# Patient Record
Sex: Female | Born: 1960 | Hispanic: Yes | Smoking: Never smoker
Health system: Southern US, Community
[De-identification: ages and names within clinical notes are randomized; demographics above are authoritative.]

## PROBLEM LIST (undated history)

## (undated) DIAGNOSIS — N289 Disorder of kidney and ureter, unspecified: Secondary | ICD-10-CM

## (undated) DIAGNOSIS — J189 Pneumonia, unspecified organism: Secondary | ICD-10-CM

## (undated) DIAGNOSIS — G629 Polyneuropathy, unspecified: Secondary | ICD-10-CM

## (undated) DIAGNOSIS — F329 Major depressive disorder, single episode, unspecified: Secondary | ICD-10-CM

## (undated) DIAGNOSIS — Z993 Dependence on wheelchair: Secondary | ICD-10-CM

## (undated) DIAGNOSIS — F32A Depression, unspecified: Secondary | ICD-10-CM

## (undated) DIAGNOSIS — A419 Sepsis, unspecified organism: Secondary | ICD-10-CM

## (undated) DIAGNOSIS — R079 Chest pain, unspecified: Secondary | ICD-10-CM

## (undated) DIAGNOSIS — I10 Essential (primary) hypertension: Secondary | ICD-10-CM

## (undated) HISTORY — PX: CHOLECYSTECTOMY: SHX55

## (undated) HISTORY — PX: ABDOMINAL HYSTERECTOMY: SHX81

## (undated) HISTORY — DX: Chest pain, unspecified: R07.9

## (undated) HISTORY — PX: APPENDECTOMY: SHX54

---

## 2002-04-22 ENCOUNTER — Emergency Department (HOSPITAL_COMMUNITY): Admission: EM | Admit: 2002-04-22 | Discharge: 2002-04-22 | Payer: Self-pay | Admitting: Emergency Medicine

## 2004-09-05 ENCOUNTER — Inpatient Hospital Stay: Payer: Self-pay | Admitting: Unknown Physician Specialty

## 2005-12-12 ENCOUNTER — Emergency Department (HOSPITAL_COMMUNITY): Admission: EM | Admit: 2005-12-12 | Discharge: 2005-12-13 | Payer: Self-pay | Admitting: Emergency Medicine

## 2008-01-29 ENCOUNTER — Other Ambulatory Visit: Payer: Self-pay

## 2008-01-29 ENCOUNTER — Observation Stay: Payer: Self-pay | Admitting: Internal Medicine

## 2008-01-30 ENCOUNTER — Other Ambulatory Visit: Payer: Self-pay

## 2008-12-18 ENCOUNTER — Ambulatory Visit (HOSPITAL_COMMUNITY): Admission: RE | Admit: 2008-12-18 | Discharge: 2008-12-18 | Payer: Self-pay

## 2009-02-03 ENCOUNTER — Ambulatory Visit (HOSPITAL_COMMUNITY): Admission: RE | Admit: 2009-02-03 | Discharge: 2009-02-03 | Payer: Self-pay | Admitting: Family Medicine

## 2009-02-24 ENCOUNTER — Emergency Department (HOSPITAL_COMMUNITY): Admission: EM | Admit: 2009-02-24 | Discharge: 2009-02-24 | Payer: Self-pay | Admitting: Emergency Medicine

## 2009-02-25 ENCOUNTER — Encounter (INDEPENDENT_AMBULATORY_CARE_PROVIDER_SITE_OTHER): Payer: Self-pay | Admitting: *Deleted

## 2009-04-15 ENCOUNTER — Ambulatory Visit: Payer: Self-pay | Admitting: Internal Medicine

## 2009-04-15 DIAGNOSIS — R197 Diarrhea, unspecified: Secondary | ICD-10-CM

## 2009-04-15 DIAGNOSIS — K219 Gastro-esophageal reflux disease without esophagitis: Secondary | ICD-10-CM | POA: Insufficient documentation

## 2009-04-16 ENCOUNTER — Encounter: Payer: Self-pay | Admitting: Internal Medicine

## 2009-04-29 ENCOUNTER — Ambulatory Visit (HOSPITAL_COMMUNITY): Admission: RE | Admit: 2009-04-29 | Discharge: 2009-04-29 | Payer: Self-pay | Admitting: Internal Medicine

## 2009-04-29 ENCOUNTER — Ambulatory Visit: Payer: Self-pay | Admitting: Internal Medicine

## 2009-09-15 ENCOUNTER — Encounter: Payer: Self-pay | Admitting: Physician Assistant

## 2009-09-30 ENCOUNTER — Ambulatory Visit: Payer: Self-pay | Admitting: Family Medicine

## 2009-09-30 DIAGNOSIS — G43909 Migraine, unspecified, not intractable, without status migrainosus: Secondary | ICD-10-CM | POA: Insufficient documentation

## 2009-09-30 DIAGNOSIS — F329 Major depressive disorder, single episode, unspecified: Secondary | ICD-10-CM | POA: Insufficient documentation

## 2009-09-30 DIAGNOSIS — I10 Essential (primary) hypertension: Secondary | ICD-10-CM

## 2009-09-30 DIAGNOSIS — E119 Type 2 diabetes mellitus without complications: Secondary | ICD-10-CM | POA: Insufficient documentation

## 2009-09-30 DIAGNOSIS — E669 Obesity, unspecified: Secondary | ICD-10-CM

## 2009-09-30 DIAGNOSIS — E1169 Type 2 diabetes mellitus with other specified complication: Secondary | ICD-10-CM

## 2009-10-01 ENCOUNTER — Telehealth: Payer: Self-pay | Admitting: Physician Assistant

## 2009-10-07 ENCOUNTER — Ambulatory Visit: Payer: Self-pay | Admitting: Family Medicine

## 2009-10-07 DIAGNOSIS — M76899 Other specified enthesopathies of unspecified lower limb, excluding foot: Secondary | ICD-10-CM

## 2009-10-07 DIAGNOSIS — E669 Obesity, unspecified: Secondary | ICD-10-CM | POA: Insufficient documentation

## 2009-10-12 ENCOUNTER — Telehealth: Payer: Self-pay | Admitting: Physician Assistant

## 2009-10-22 ENCOUNTER — Telehealth: Payer: Self-pay | Admitting: Family Medicine

## 2009-11-15 ENCOUNTER — Encounter: Payer: Self-pay | Admitting: Physician Assistant

## 2009-11-15 ENCOUNTER — Ambulatory Visit: Payer: Self-pay | Admitting: Family Medicine

## 2009-11-15 DIAGNOSIS — R002 Palpitations: Secondary | ICD-10-CM

## 2009-11-18 ENCOUNTER — Encounter: Payer: Self-pay | Admitting: Physician Assistant

## 2009-12-03 ENCOUNTER — Encounter (INDEPENDENT_AMBULATORY_CARE_PROVIDER_SITE_OTHER): Payer: Self-pay | Admitting: *Deleted

## 2009-12-03 ENCOUNTER — Encounter: Payer: Self-pay | Admitting: Physician Assistant

## 2009-12-03 ENCOUNTER — Ambulatory Visit: Payer: Self-pay | Admitting: Family Medicine

## 2009-12-03 DIAGNOSIS — R109 Unspecified abdominal pain: Secondary | ICD-10-CM | POA: Insufficient documentation

## 2009-12-03 LAB — CONVERTED CEMR LAB
ALT: 15 units/L
AST: 13 units/L
Basophils Absolute: 0 10*3/uL
Eosinophils Absolute: 0.1 10*3/uL
Glomerular Filtration Rate, Af Am: >60 mL/min/{1.73_m2}
Glucose, Bld: 57 mg/dL
Lymphocytes Relative: 30 %
Lymphs Abs: 2.7 10*3/uL
MCHC: 32.8 g/dL
MCV: 94 fL
Potassium: 4.4 meq/L
RDW: 13.9 %
Sodium: 140 meq/L
Total Protein: 6.7 g/dL

## 2009-12-07 ENCOUNTER — Ambulatory Visit (HOSPITAL_COMMUNITY): Admission: RE | Admit: 2009-12-07 | Discharge: 2009-12-07 | Payer: Self-pay | Admitting: Family Medicine

## 2009-12-09 LAB — CONVERTED CEMR LAB
ALT: 15 units/L (ref 0–35)
Alkaline Phosphatase: 66 units/L (ref 39–117)
Basophils Absolute: 0 10*3/uL (ref 0.0–0.1)
Calcium: 8.7 mg/dL (ref 8.4–10.5)
Creatinine, Ser: 0.49 mg/dL (ref 0.40–1.20)
Eosinophils Absolute: 0.1 10*3/uL (ref 0.0–0.7)
Eosinophils Relative: 1 % (ref 0–5)
HCT: 43.9 % (ref 36.0–46.0)
Lipase: 23 units/L (ref 0–75)
Lymphocytes Relative: 30 % (ref 12–46)
Microalb Creat Ratio: 8.8 mg/g (ref 0.0–30.0)
Platelets: 237 10*3/uL (ref 150–400)
RDW: 13.9 % (ref 11.5–15.5)
Sodium: 140 meq/L (ref 135–145)
Total Bilirubin: 0.6 mg/dL (ref 0.3–1.2)
Total Protein: 6.7 g/dL (ref 6.0–8.3)

## 2009-12-16 ENCOUNTER — Telehealth: Payer: Self-pay | Admitting: Physician Assistant

## 2009-12-17 ENCOUNTER — Ambulatory Visit: Payer: Self-pay | Admitting: Family Medicine

## 2009-12-17 LAB — CONVERTED CEMR LAB
HDL: 43 mg/dL (ref >39)
LDL Cholesterol: 92 mg/dL (ref 0–99)
Total CHOL/HDL Ratio: 3.8
VLDL: 28 mg/dL (ref 0–40)

## 2009-12-20 ENCOUNTER — Encounter: Payer: Self-pay | Admitting: Physician Assistant

## 2009-12-28 ENCOUNTER — Telehealth: Payer: Self-pay | Admitting: Family Medicine

## 2010-04-25 ENCOUNTER — Ambulatory Visit: Payer: Self-pay | Admitting: Family Medicine

## 2010-04-25 ENCOUNTER — Encounter: Payer: Self-pay | Admitting: Physician Assistant

## 2010-04-25 ENCOUNTER — Encounter (INDEPENDENT_AMBULATORY_CARE_PROVIDER_SITE_OTHER): Payer: Self-pay | Admitting: *Deleted

## 2010-04-25 DIAGNOSIS — R0789 Other chest pain: Secondary | ICD-10-CM | POA: Insufficient documentation

## 2010-04-25 DIAGNOSIS — R42 Dizziness and giddiness: Secondary | ICD-10-CM

## 2010-04-25 LAB — CONVERTED CEMR LAB
ALT: 15 units/L
AST: 12 units/L
Alkaline Phosphatase: 103 units/L
BUN: 16 mg/dL
Blood Glucose, Fingerstick: 271
Chloride: 104 meq/L
Creatinine, Ser: 0.59 mg/dL
HCT: 42.7 %
Hgb A1c MFr Bld: 9.1 %
MCHC: 33.5 g/dL
MCV: 93.4 fL
Platelets: 237 10*3/uL
RDW: 13.2 %
Total Protein: 6.4 g/dL
WBC: 8.2 10*3/uL

## 2010-04-27 ENCOUNTER — Encounter (INDEPENDENT_AMBULATORY_CARE_PROVIDER_SITE_OTHER): Payer: Self-pay | Admitting: *Deleted

## 2010-04-28 ENCOUNTER — Encounter: Payer: Self-pay | Admitting: Physician Assistant

## 2010-04-28 ENCOUNTER — Ambulatory Visit (HOSPITAL_COMMUNITY): Admission: RE | Admit: 2010-04-28 | Discharge: 2010-04-28 | Payer: Self-pay | Admitting: Family Medicine

## 2010-04-28 LAB — CONVERTED CEMR LAB
ALT: 15 units/L (ref 0–35)
AST: 12 units/L (ref 0–37)
Albumin: 4.1 g/dL (ref 3.5–5.2)
Calcium: 9.2 mg/dL (ref 8.4–10.5)
Chloride: 104 meq/L (ref 96–112)
Platelets: 237 10*3/uL (ref 150–400)
Potassium: 3.7 meq/L (ref 3.5–5.3)
RDW: 13.2 % (ref 11.5–15.5)
Sodium: 136 meq/L (ref 135–145)

## 2010-04-29 ENCOUNTER — Encounter: Payer: Self-pay | Admitting: Physician Assistant

## 2010-05-04 ENCOUNTER — Ambulatory Visit: Payer: Self-pay | Admitting: Cardiology

## 2010-05-05 ENCOUNTER — Encounter: Payer: Self-pay | Admitting: Cardiology

## 2010-05-09 ENCOUNTER — Encounter: Payer: Self-pay | Admitting: Cardiology

## 2010-06-22 ENCOUNTER — Telehealth: Payer: Self-pay | Admitting: Family Medicine

## 2010-06-27 ENCOUNTER — Telehealth (INDEPENDENT_AMBULATORY_CARE_PROVIDER_SITE_OTHER): Payer: Self-pay | Admitting: *Deleted

## 2010-06-30 ENCOUNTER — Ambulatory Visit: Payer: Self-pay | Admitting: Family Medicine

## 2010-07-08 ENCOUNTER — Encounter: Payer: Self-pay | Admitting: Family Medicine

## 2010-07-15 ENCOUNTER — Emergency Department (HOSPITAL_COMMUNITY)
Admission: EM | Admit: 2010-07-15 | Discharge: 2010-07-16 | Payer: Self-pay | Source: Home / Self Care | Admitting: Emergency Medicine

## 2010-07-29 ENCOUNTER — Ambulatory Visit (HOSPITAL_COMMUNITY): Admit: 2010-07-29 | Payer: Self-pay | Admitting: Psychology

## 2010-08-14 ENCOUNTER — Encounter: Payer: Self-pay | Admitting: Family Medicine

## 2010-08-21 LAB — CONVERTED CEMR LAB: TSH: 0.816 microintl units/mL (ref 0.350–4.500)

## 2010-08-23 NOTE — Assessment & Plan Note (Signed)
Summary: follow up - room 1   Vital Signs:  Patient profile:   50 year old female Height:      63 inches Weight:      261.25 pounds BMI:     46.45 O2 Sat:      97 % on Room air Pulse rate:   107 / minute Resp:     16 per minute BP sitting:   130 / 78  (left arm)  Vitals Entered By: Baldomero Lamy LPN (March 17, 624THL 075-GRM PM) CC: follow-up visit Is Patient Diabetic? Yes Did you bring your meter with you today? No Pain Assessment Patient in pain? no        Referring Provider:  Verneda Skill, [facility] ED Primary Provider:  Kennith Gain PA  CC:  follow-up visit.  History of Present Illness: Pt is here today for f/u of her diabetes.  She did not bring her blood sugar log.  States is was in the mid 200's this am when checked it.  Did increase her Glipizide but not her Metformin.  Remembered that she had diarrhea with higher doses of Metformin in the past.    Pt admits that she needs to eat better.  Like carbs - escpecially pasta & rice.  Feels that she needs help with her diet.  Pt also has pain in her Lt hip & leg.  Painful to lie on Lt side.  Also more aching & discomfort at the end of the day & while working.  She denies any recent or past low back pain.  Was told prev that she has bursitis in her hip.  Tried prescription meds but didn't help much.  Current Medications (verified): 1)  Effexor Xr 150 Mg Xr24h-Cap (Venlafaxine Hcl) .... Once Daily 2)  Glyburide 5 Mg Tabs (Glyburide) .... Once Daily 3)  Metformin Hcl 500 Mg Tabs (Metformin Hcl) .... Once Daily 4)  Novolin N 100 Unit/ml Susp (Insulin Isophane Human) .... 20u Once Daily 5)  Enalapril Maleate 10 Mg Tabs (Enalapril Maleate) .... One By Mouth Daily 6)  Bd Insulin Syringe Ultrafine 31g X 5/16" 1 Ml Misc (Insulin Syringe-Needle U-100) .... Use To Inject Insulin Subcutaneously Once Daily 7)  Sumatriptan Succinate 100 Mg Tabs (Sumatriptan Succinate) .... Take 1 At Onset of Migraine.  May Take 2nd Tab 2 Hrs Later If Needed.  Max 2  Tabs/24 Hrs.  Allergies (verified): 1)  ! Codeine 2)  ! Ibuprofen  Past History:  Past medical history reviewed for relevance to current acute and chronic problems.  Past Medical History: Reviewed history from 09/30/2009 and no changes required.  Hypertension Depression Diabetes mellitus, type II  Physical Exam  General:  Well-developed,well-nourished,in no acute distress; alert,appropriate and cooperative throughout examination Head:  Normocephalic and atraumatic without obvious abnormalities. No apparent alopecia or balding. Ears:  External ear exam shows no significant lesions or deformities.  Otoscopic examination reveals clear canals, tympanic membranes are intact bilaterally without bulging, retraction, inflammation or discharge. Hearing is grossly normal bilaterally. Nose:  External nasal examination shows no deformity or inflammation. Nasal mucosa are pink and moist without lesions or exudates. Mouth:  Oral mucosa and oropharynx without lesions or exudates.  Teeth in good repair. Neck:  No deformities, masses, or tenderness noted. Lungs:  Normal respiratory effort, chest expands symmetrically. Lungs are clear to auscultation, no crackles or wheezes. Heart:  Normal rate and regular rhythm. S1 and S2 normal without gallop, murmur, click, rub or other extra sounds. Msk:  LS spine nontender to  palp spinous processes, SI joints & sciatic notches. Lt hip tender to palp over greater trochanter.  Remainder of LLE is nontender. Pulses:  R posterior tibial normal and R dorsalis pedis normal.   Extremities:  No clubbing, cyanosis, edema, or deformity noted with normal full range of motion of all joints.   Neurologic:  alert & oriented X3, sensation intact to light touch, gait normal, and DTRs symmetrical and normal.   Skin:  spider veins LLE No varicose veins noted Cervical Nodes:  No lymphadenopathy noted Psych:  Cognition and judgment appear intact. Alert and cooperative with normal  attention span and concentration. No apparent delusions, illusions, hallucinations   Impression & Recommendations:  Problem # 1:  DIABETES MELLITUS, TYPE II (ICD-250.00) Assessment Unchanged Discussed with pt addition of insulin vs trying to maximize oral meds.  Pt prefers to try additional oral meds. I believe pt will need a change of her insulin. Consider Lantus &/or premixed in place of her Novolin N. Pt was given diabetic diet/carb book to review & improve diet habits.  Her updated medication list for this problem includes:    Glyburide 5 Mg Tabs (Glyburide) .Marland Kitchen... 2 daily    Metformin Hcl 500 Mg Tabs (Metformin hcl) .Marland Kitchen... 2 pills once daily    Novolin N 100 Unit/ml Susp (Insulin isophane human) .Marland Kitchen... 20u once daily    Enalapril Maleate 10 Mg Tabs (Enalapril maleate) ..... One by mouth daily    Januvia 100 Mg Tabs (Sitagliptin phosphate) .Marland Kitchen... Take 1 daily  Problem # 2:  HYPERTENSION (ICD-401.9) Assessment: Unchanged  Her updated medication list for this problem includes:    Enalapril Maleate 10 Mg Tabs (Enalapril maleate) ..... One by mouth daily    Furosemide 20 Mg Tabs (Furosemide) .Marland Kitchen... Take 1 q am as needed for swelling  BP today: 130/78 Prior BP: 140/80 (09/30/2009)  Problem # 3:  TROCHANTERIC BURSITIS, LEFT (ICD-726.5) Assessment: Unchanged Ice area 3-4 times a day. Will use Tylenol as needed for pain. Recommended ortho consult since this has been ongoing & failed other treatments from prev dr.  Pt wishes to wait due to other medical bills.  Problem # 4:  OBESITY (ICD-278.00) Assessment: Deteriorated  Ht: 63 (10/07/2009)   Wt: 261.25 (10/07/2009)   BMI: 46.45 (10/07/2009)  Discussed diet & exercise.  Complete Medication List: 1)  Effexor Xr 150 Mg Xr24h-cap (Venlafaxine hcl) .... Once daily 2)  Glyburide 5 Mg Tabs (Glyburide) .... 2 daily 3)  Metformin Hcl 500 Mg Tabs (Metformin hcl) .... 2 pills once daily 4)  Novolin N 100 Unit/ml Susp (Insulin isophane human)  .... 20u once daily 5)  Enalapril Maleate 10 Mg Tabs (Enalapril maleate) .... One by mouth daily 6)  Bd Insulin Syringe Ultrafine 31g X 5/16" 1 Ml Misc (Insulin syringe-needle u-100) .... Use to inject insulin subcutaneously once daily 7)  Sumatriptan Succinate 100 Mg Tabs (Sumatriptan succinate) .... Take 1 at onset of migraine.  may take 2nd tab 2 hrs later if needed.  max 2 tabs/24 hrs. 8)  Januvia 100 Mg Tabs (Sitagliptin phosphate) .... Take 1 daily 9)  Furosemide 20 Mg Tabs (Furosemide) .... Take 1 q am as needed for swelling  Other Orders: Pneumococcal Vaccine [phone]) Admin 1st Vaccine [id number]) Admin 1st Vaccine [facility]) [id number])  Patient Instructions: 1)  Please schedule a follow-up appointment in 1 month. 2)  It is important that you exercise regularly at least 20 minutes 5 times a week. If you develop chest pain, have severe difficulty breathing, or  feel very tired , stop exercising immediately and seek medical attention. 3)  You need to lose weight. Consider a lower calorie diet and regular exercise.  4)  Check your blood sugars regularly. Bring your blood sugar log with you to your next appt. 5)  You have received a pneumonia shot today. 6)  See your eye doctor yearly to check for diabetic eye damage. Prescriptions: FUROSEMIDE 20 MG TABS (FUROSEMIDE) take 1 q am as needed for swelling  #30 x 0   Entered and Authorized by:   Kennith Gain PA   Signed by:   Baldomero Lamy LPN on 075-GRM   Method used:   Electronically to        [facility] (retail)       [address]       [place], [place]  [address]       Ph: [phone]       Fax: [phone]   RxID:   [id number] JANUVIA 100 MG TABS (SITAGLIPTIN PHOSPHATE) take 1 daily  #30 x 3   Entered and Authorized by:   Kennith Gain PA   Signed by:   Baldomero Lamy LPN on 075-GRM   Method used:   Electronically to        [facility]  [address] (retail)       [address]        [place],   [address]       Ph: [phone]       Fax: [phone]   RxID:   [id number]    Pneumovax Vaccine    Vaccine Type: Pneumovax    Site: left deltoid    Mfr: Merck    Dose: 0.5 ml    Route: IM    Given by: Baldomero Lamy LPN    Exp. Date: 08/19/2010    Lot #: [id number]    VIS given: 02/19/96 version given October 07, 2009.

## 2010-08-23 NOTE — Miscellaneous (Signed)
Summary: labs cbcd,cmp,a1c, 04/25/2010  Clinical Lists Changes  Observations: Added new observation of CALCIUM: 9.2 mg/dL (04/25/2010 12:35) Added new observation of ALBUMIN: 4.1 g/dL (04/25/2010 12:35) Added new observation of PROTEIN, TOT: 6.4 g/dL (04/25/2010 12:35) Added new observation of SGPT (ALT): 15 units/L (04/25/2010 12:35) Added new observation of SGOT (AST): 12 units/L (04/25/2010 12:35) Added new observation of ALK PHOS: 103 units/L (04/25/2010 12:35) Added new observation of CREATININE: 0.59 mg/dL (04/25/2010 12:35) Added new observation of BUN: 16 mg/dL (04/25/2010 12:35) Added new observation of BG RANDOM: 247 mg/dL (04/25/2010 12:35) Added new observation of CO2 PLSM/SER: 21 meq/L (04/25/2010 12:35) Added new observation of CL SERUM: 104 meq/L (04/25/2010 12:35) Added new observation of K SERUM: 3.7 meq/L (04/25/2010 12:35) Added new observation of NA: 136 meq/L (04/25/2010 12:35) Added new observation of HGBA1C: 9.1 % (04/25/2010 12:35) Added new observation of PLATELETK/UL: 237 K/uL (04/25/2010 12:35) Added new observation of RDW: 13.2 % (04/25/2010 12:35) Added new observation of MCHC RBC: 33.5 g/dL (04/25/2010 12:35) Added new observation of MCV: 93.4 fL (04/25/2010 12:35) Added new observation of HCT: 42.7 % (04/25/2010 12:35) Added new observation of HGB: 14.3 g/dL (04/25/2010 12:35) Added new observation of RBC M/UL: 4.57 M/uL (04/25/2010 12:35) Added new observation of WBC COUNT: 8.2 10*3/microliter (04/25/2010 12:35)

## 2010-08-23 NOTE — Assessment & Plan Note (Signed)
Summary: NEW PATIENT- room 2   Vital Signs:  Patient profile:   50 year old female Height:      63 inches Weight:      258.75 pounds BMI:     46.00 O2 Sat:      97 % on Room air Pulse rate:   122 / minute Resp:     16 per minute BP sitting:   140 / 80  (left arm)  Vitals Entered By: Baldomero Lamy LPN (March 10, 624THL 624THL PM)  Nutrition Counseling: Patient's BMI is greater than 25 and therefore counseled on weight management options.  Serial Vital Signs/Assessments:  Time      Position  BP       Pulse  Resp  Temp     By                     120/78                         Kennith Gain PA  CC: new patient Is Patient Diabetic? Yes Did you bring your meter with you today? No Pain Assessment Patient in pain? no        Referring Provider:  Verneda Skill, [facility] ED Primary Provider:  Kennith Gain PA  CC:  new patient.  History of Present Illness: Pt is here today to establish with a new PCP.  Has been receiving her health care at the health dept.  She is diabetic.  She is concerned about her elevated blood sugars & is hoping to get them better controlled.  She states that in the past she was nearly 400#.  She knows she needs to improve her diet more.  Does not exercise but is active at work.  She is using 35 units of Novolin N once daily.  She is also  taking her glyburide once daily and 1000mg  of metformin once daily.  Pt brough her recent lab results.  Also home FBS readings which range mid 200's to 316.  She has a hx of migraines since childhood.  Had gone away for a long time.  Recently have returned.  Pain back of her head, severe, nausea & vomiting.  Admits to photo & phono sensitivity too.  No parasthesias.  Does have blurred vision off & on but not assoc with headaches.  She is also taking her BP meds daily.  Denies chest pain, palp or difficulty breathing.  Has some mild swelling & stiffness in ams.  Her depression is well controlled on Effexor.  Has been taking x apprx 3 yrs.  Sleeps well.   Current Medications (verified): 1)  Effexor Xr 150 Mg Xr24h-Cap (Venlafaxine Hcl) .... Once Daily 2)  Glyburide 5 Mg Tabs (Glyburide) .... Once Daily 3)  Metformin Hcl 500 Mg Tabs (Metformin Hcl) .... Once Daily 4)  Novolin N 100 Unit/ml Susp (Insulin Isophane Human) .... 20u Once Daily 5)  Enalapril Maleate 10 Mg Tabs (Enalapril Maleate) .... One By Mouth Daily  Allergies (verified): 1)  ! Codeine 2)  ! Ibuprofen  Past History:  Past medical, surgical, family and social histories (including risk factors) reviewed, and no changes noted (except as noted below).  Past Medical History:  Hypertension Depression Diabetes mellitus, type II  Past Surgical History: Reviewed history from 04/15/2009 and no changes required. Partial hysterectomy Cholecystectomy C-section X 2 Left hand, cyst  Family History: Reviewed history from 04/15/2009 and no changes required. No FH of  CRC or IBD. Maternal aunt died of liver dz. Family History of Diabetes Family History of Heart Disease:   Social History: Reviewed history from 04/15/2009 and no changes required. Married.  3 grown children.  Works at [employer], custodial services Patient has never smoked.  Alcohol Use - no Illicit Drug Use - no  Review of Systems Eyes:  Complains of blurring; denies double vision. ENT:  Denies earache, nasal congestion, and sore throat. CV:  Denies chest pain or discomfort and palpitations. Resp:  Denies cough and shortness of breath. GI:  Complains of constipation; denies abdominal pain.  Physical Exam  General:  Well-developed,well-nourished,in no acute distress; alert,appropriate and cooperative throughout examination Head:  Normocephalic and atraumatic without obvious abnormalities. No apparent alopecia or balding. Eyes:  No corneal or conjunctival inflammation noted. EOMI. Perrla. Funduscopic exam benign, without hemorrhages, exudates or papilledema. Vision grossly normal. Ears:   External ear exam shows no significant lesions or deformities.  Otoscopic examination reveals clear canals, tympanic membranes are intact bilaterally without bulging, retraction, inflammation or discharge. Hearing is grossly normal bilaterally. Nose:  External nasal examination shows no deformity or inflammation. Nasal mucosa are pink and moist without lesions or exudates. Mouth:  Oral mucosa and oropharynx without lesions or exudates.  Teeth in good repair. Neck:  No deformities, masses, or tenderness noted. Lungs:  Normal respiratory effort, chest expands symmetrically. Lungs are clear to auscultation, no crackles or wheezes. Heart:  Normal rate and regular rhythm. S1 and S2 normal without gallop, murmur, click, rub or other extra sounds. Abdomen:  Bowel sounds positive,abdomen soft and non-tender without masses, organomegaly or hernias noted. Extremities:  No clubbing, cyanosis, edema, or deformity noted with normal full range of motion of all joints.   Neurologic:  alert & oriented X3, cranial nerves II-XII intact, strength normal in all extremities, gait normal, and DTRs symmetrical and normal.   Cervical Nodes:  No lymphadenopathy noted Psych:  Cognition and judgment appear intact. Alert and cooperative with normal attention span and concentration. No apparent delusions, illusions, hallucinations   Impression & Recommendations:  Problem # 1:  DIABETES MELLITUS, TYPE II (ICD-250.00) Recommend pt sched eye exam.  Her updated medication list for this problem includes:    Glyburide 5 Mg Tabs (Glyburide) ..... Once daily    Metformin Hcl 500 Mg Tabs (Metformin hcl) ..... Once daily    Novolin N 100 Unit/ml Susp (Insulin isophane human) .Marland Kitchen... 20u once daily    Enalapril Maleate 10 Mg Tabs (Enalapril maleate) ..... One by mouth daily  Discussed with Dr Moshe Cipro Increase Glyburide to 2 daily. Increase Metformin to 2 two times a day Will continue Novolin N at the current dose  currently.  Problem # 2:  HYPERTENSION (ICD-401.9) Assessment: Improved  Cont current prescription dose.  Her updated medication list for this problem includes:    Enalapril Maleate 10 Mg Tabs (Enalapril maleate) ..... One by mouth daily  BP today: 140/80  Recheck 120/78 Prior BP: 112/70 (04/15/2009)  Problem # 3:  DEPRESSION (ICD-311) Assessment: Improved Continue current prescription.  Her updated medication list for this problem includes:    Effexor Xr 150 Mg Xr24h-cap (Venlafaxine hcl) ..... Once daily  Problem # 4:  MIGRAINE HEADACHE (ICD-346.90) Assessment: Deteriorated Imitrex prescription to try.  The following medications were removed from the medication list:    Vicodin 5-500 Mg Tabs (Hydrocodone-acetaminophen) .Marland Kitchen... As needed  Complete Medication List: 1)  Effexor Xr 150 Mg Xr24h-cap (Venlafaxine hcl) .... Once daily 2)  Glyburide 5 Mg Tabs (  Glyburide) .... Once daily 3)  Metformin Hcl 500 Mg Tabs (Metformin hcl) .... Once daily 4)  Novolin N 100 Unit/ml Susp (Insulin isophane human) .... 20u once daily 5)  Enalapril Maleate 10 Mg Tabs (Enalapril maleate) .... One by mouth daily 6)  Bd Insulin Syringe Ultrafine 31g X 5/16" 1 Ml Misc (Insulin syringe-needle u-100) .... Use to inject insulin subcutaneously once daily  Patient Instructions: 1)  Follow up appt in 1 week. 2)  Check your blood sugar fasting & 2 hrs after dinner.  Bring your readings to your next appt. 3)  Increase your Glyburide 2 every morning. 4)  Increase your Metformin to 2 two times a day. 5)  Continue the current dose of insulin. 6)  Call the office if you start having low blood sugars. 7)  I have also prescribed a medication for you to try for your migraine headaches. Prescriptions: ENALAPRIL MALEATE 10 MG TABS (ENALAPRIL MALEATE) one by mouth daily  #30 x 2   Entered by:   Baldomero Lamy LPN   Authorized by:   Kennith Gain PA   Signed by:   Baldomero Lamy LPN on [id number]   Method used:    Electronically to        [facility] (retail)       [address]       [place], [place]  [address]       Ph: [phone]       Fax: [phone]   RxID:   [id number] BD INSULIN SYRINGE ULTRAFINE 31G X 5/16" 1 ML MISC (INSULIN SYRINGE-NEEDLE U-100) USE TO INJECT INSULIN Subcutaneously once daily  #1 MONTH x 2   Entered by:   Baldomero Lamy LPN   Authorized by:   Kennith Gain PA   Signed by:   Baldomero Lamy LPN on [id number]   Method used:   Electronically to        [facility]  [address] (retail)       [address]       [place], [place]  [address]       Ph: [phone]       Fax: [phone]   RxID:   [id number]

## 2010-08-23 NOTE — Letter (Signed)
Summary: Letter  Letter   Imported By: Dierdre Harness 04/28/2010 15:45:41  _____________________________________________________________________  External Attachment:    Type:   Image     Comment:   External Document

## 2010-08-23 NOTE — Assessment & Plan Note (Signed)
Summary: Stomach pain ROOM 1   Vital Signs:  Patient profile:   50 year old female Height:      63 inches Weight:      260.75 pounds BMI:     46.36 O2 Sat:      95 % Pulse rate:   84 / minute Resp:     16 per minute BP sitting:   120 / 72  (left arm) Cuff size:   large  Vitals Entered By: Kate Sable LPN (May 13, 624THL 624THL AM) CC: Hurting in right side for awhile. Has gotten worse and has spread to under her right breast   Referring Provider:  Verneda Skill, [facility] ED Primary Provider:  Kennith Gain PA  CC:  Hurting in right side for awhile. Has gotten worse and has spread to under her right breast.  History of Present Illness: Pt is here today with c/o abd pain.  She has had pain in her RLQ for approx 1 year.  She has seen a surgeon in the past regarding this.  In the last week she has developed intermittent pain in her RUQ.  There is no radiation to her back.  The pain is dull, achey, not sharp.  No change with or without eating.  Has been a little nauseated.  No vomiting, & no change of BM's   No fever.  Hx of DM.  Taking medications as prescribed. No episodes of hypoglycemia.  Blood sugars are coming down & doing much better with Lantus.  Is up to 16 units a day.  She has an appt next week for follow up & will have her labs drawn today.  States overall she is feeling better, & has more energy.  Just having this abd pain.    Current Medications (verified): 1)  Effexor Xr 150 Mg Xr24h-Cap (Venlafaxine Hcl) .... Once Daily 2)  Glyburide 5 Mg Tabs (Glyburide) .... 2 Daily 3)  Enalapril Maleate 10 Mg Tabs (Enalapril Maleate) .... One By Mouth Daily 4)  Bd Insulin Syringe Ultrafine 31g X 5/16" 1 Ml Misc (Insulin Syringe-Needle U-100) .... Use To Inject Insulin Subcutaneously Once Daily 5)  Sumatriptan Succinate 100 Mg Tabs (Sumatriptan Succinate) .... Take 1 At Onset of Migraine.  May Take 2nd Tab 2 Hrs Later If Needed.  Max 2 Tabs/24 Hrs. 6)  Furosemide 20 Mg Tabs (Furosemide) ....  Take 1 Q Am As Needed For Swelling 7)  Fasttake Test  Strp (Glucose Blood) .... Two Times A Day Testing 8)  Venlafaxine Hcl 75 Mg Xr24h-Tab (Venlafaxine Hcl) .... Take 1 Daily in Addition To The Venlafaxine Xr 150 Mg 9)  Kombiglyze Xr 11-998 Mg Xr24h-Tab (Saxagliptin-Metformin) .... Take 1 Daily With Evening Meal.  Discontinue Metformin. 10)  Lantus Solostar 100 Unit/ml Soln (Insulin Glargine) .... Inject 30 Units At Bedtime, or As Directed 11)  Relion Pen Needles 31g X 8 Mm Misc (Insulin Pen Needle) .... To Use With Daily Lantus Injection  Allergies (verified): 1)  ! Codeine 2)  ! Ibuprofen  Past History:  Past medical history reviewed for relevance to current acute and chronic problems. Past surgical history reviewed for relevance to current acute and chronic problems.  Past Medical History: Reviewed history from 09/30/2009 and no changes required.  Hypertension Depression Diabetes mellitus, type II  Past Surgical History: Reviewed history from 04/15/2009 and no changes required. Partial hysterectomy Cholecystectomy C-section X 2 Left hand, cyst  Review of Systems General:  Denies chills and fever. CV:  Denies chest pain or  discomfort. Resp:  Denies shortness of breath. GI:  Complains of abdominal pain and nausea; denies bloody stools, change in bowel habits, constipation, dark tarry stools, diarrhea, indigestion, loss of appetite, and vomiting. GU:  Denies dysuria and urinary frequency.  Physical Exam  General:  Well-developed,well-nourished,in no acute distress; alert,appropriate and cooperative throughout examination Head:  Normocephalic and atraumatic without obvious abnormalities. No apparent alopecia or balding. Ears:  External ear exam shows no significant lesions or deformities.  Otoscopic examination reveals clear canals, tympanic membranes are intact bilaterally without bulging, retraction, inflammation or discharge. Hearing is grossly normal bilaterally. Nose:   External nasal examination shows no deformity or inflammation. Nasal mucosa are pink and moist without lesions or exudates. Mouth:  Oral mucosa and oropharynx without lesions or exudates.  Teeth in good repair. Neck:  No deformities, masses, or tenderness noted. Lungs:  Normal respiratory effort, chest expands symmetrically. Lungs are clear to auscultation, no crackles or wheezes. Heart:  Normal rate and regular rhythm. S1 and S2 normal without gallop, murmur, click, rub or other extra sounds. Abdomen:  Nl BS x 4.  soft, no masses, no hepatomegaly, and no splenomegaly.  + TTP RUQ, no guarding, rebound or rigidity. Cervical Nodes:  No lymphadenopathy noted Psych:  Cognition and judgment appear intact. Alert and cooperative with normal attention span and concentration. No apparent delusions, illusions, hallucinations   Impression & Recommendations:  Problem # 1:  ABDOMINAL PAIN (ICD-789.00) Assessment New  Orders: T-CMP with estimated GFR ([id number]) T-CBC w/Diff [id number]) T-Amylase [id number]) T-Lipase [id number]) CT Abdomen/Pelvis with Contrast (CT A/P with Cont)  Problem # 2:  DIABETES MELLITUS, TYPE II (ICD-250.00) Assessment: Improved  Her updated medication list for this problem includes:    Glyburide 5 Mg Tabs (Glyburide) .Marland Kitchen... 2 daily    Enalapril Maleate 10 Mg Tabs (Enalapril maleate) ..... One by mouth daily    Kombiglyze Xr 11-998 Mg Xr24h-tab (Saxagliptin-metformin) .Marland Kitchen... Take 1 daily with evening meal.  discontinue metformin.    Lantus Solostar 100 Unit/ml Soln (Insulin glargine) ..... Inject 30 units at bedtime, or as directed  Orders: T- Hemoglobin A1C [id number]) TLB-Microalbumin/Creat Ratio, Urine (82043-MALB)  Complete Medication List: 1)  Effexor Xr 150 Mg Xr24h-cap (Venlafaxine hcl) .... Once daily 2)  Glyburide 5 Mg Tabs (Glyburide) .... 2 daily 3)  Enalapril Maleate 10 Mg Tabs (Enalapril maleate) .... One by mouth daily 4)  Bd Insulin Syringe  Ultrafine 31g X 5/16" 1 Ml Misc (Insulin syringe-needle u-100) .... Use to inject insulin subcutaneously once daily 5)  Sumatriptan Succinate 100 Mg Tabs (Sumatriptan succinate) .... Take 1 at onset of migraine.  may take 2nd tab 2 hrs later if needed.  max 2 tabs/24 hrs. 6)  Furosemide 20 Mg Tabs (Furosemide) .... Take 1 q am as needed for swelling 7)  Fasttake Test Strp (Glucose blood) .... Two times a day testing 8)  Venlafaxine Hcl 75 Mg Xr24h-tab (Venlafaxine hcl) .... Take 1 daily in addition to the venlafaxine xr 150 mg 9)  Kombiglyze Xr 11-998 Mg Xr24h-tab (Saxagliptin-metformin) .... Take 1 daily with evening meal.  discontinue metformin. 10)  Lantus Solostar 100 Unit/ml Soln (Insulin glargine) .... Inject 30 units at bedtime, or as directed 11)  Relion Pen Needles 31g X 8 Mm Misc (Insulin pen needle) .... To use with daily lantus injection 12)  Tramadol Hcl 50 Mg Tabs (Tramadol hcl) .... Take 1 every 6 hrs as needed for pain.  Patient Instructions: 1)  Keep your next appt.   2)  If your  pain worsens or you develop fever over the weekend you must go to the ER. 3)  I have prescribed Tramadol for pain. 4)  I have ordered your blood work and a CT scan. Prescriptions: TRAMADOL HCL 50 MG TABS (TRAMADOL HCL) take 1 every 6 hrs as needed for pain.  #30 x 0   Entered and Authorized by:   Kennith Gain PA   Signed by:   Kennith Gain PA on 12/03/2009   Method used:   Electronically to        [facility] (retail)       [address]       [place], [place]  [address]       Ph: [phone]       Fax: [phone]   RxID:   [id number]

## 2010-08-23 NOTE — Progress Notes (Signed)
  Phone Note Call from Patient   Caller: Son Summary of Call: blood sugar has dropped low three times this week and just started vommitting today could this be from new meds?   Initial call taken by: Baldomero Lamy LPN,  March 22, 624THL 4:17 PM  Follow-up for Phone Call        Blood sugar today was 70 this afternoon, was 189 fasting this am. Wilburn Mylar was 197, then in afternoon felt low but didn't check blood sugar. Advised pt Januvia would not be making blood sugar drop.  To decrease her Glipizide back to one a day (from 2 a day). Follow-up by: Kennith Gain PA,  October 12, 2009 5:15 PM

## 2010-08-23 NOTE — Assessment & Plan Note (Signed)
Summary: follow up - room 1   Vital Signs:  Patient profile:   50 year old female Height:      63 inches Weight:      254.50 pounds BMI:     45.25 O2 Sat:      98 % on Room air Pulse rate:   90 / minute Resp:     16 per minute BP sitting:   126 / 70  (left arm)  Vitals Entered By: Baldomero Lamy LPN (May 27, 624THL [id number] AM) CC: follow-up visit Is Patient Diabetic? Yes Comments patient has lost 6 lbs in two weeks through exercise :)    Referring Provider:  Verneda Skill, [facility] ED Primary Provider:  Kennith Gain PA   History of Present Illness: Pt is here today for f/u.  Hx of DM.  Taking medications as prescribed. No episodes of hypoglycemia, except when she had her blood work drawn fasting recently.  Lantus 16 units at bedtime. Blood sugars at home 130-150 fasting. Last eye exam - none. Taking ASA daily.  RUQ abd pain has resolved.  Still has her chronic RLQ abd pain.  CT neg.  She has seen GI in the past.  Neg colonoscopy.  She had surgical consult for laparoscopy but didn't have done because had no ins at that time.  Hx of htn. Taking medications as prescribed. No headache, chest pain or palpitations. Pt prev was having episodes of palpitations.  I had referred her to cardiology.  She cancelled appt because the palpitations have resolved.  Pt agress that if she experiences palpitations again she will see cardiology.  She states she had a cardiology evaluation a couple yrs ago & everything was neg.      Current Medications (verified): 1)  Effexor Xr 150 Mg Xr24h-Cap (Venlafaxine Hcl) .... Once Daily 2)  Glyburide 5 Mg Tabs (Glyburide) .... 2 Daily 3)  Enalapril Maleate 10 Mg Tabs (Enalapril Maleate) .... One By Mouth Daily 4)  Bd Insulin Syringe Ultrafine 31g X 5/16" 1 Ml Misc (Insulin Syringe-Needle U-100) .... Use To Inject Insulin Subcutaneously Once Daily 5)  Sumatriptan Succinate 100 Mg Tabs (Sumatriptan Succinate) .... Take 1 At Onset of Migraine.  May Take 2nd Tab 2 Hrs  Later If Needed.  Max 2 Tabs/24 Hrs. 6)  Furosemide 20 Mg Tabs (Furosemide) .... Take 1 Q Am As Needed For Swelling 7)  Fasttake Test  Strp (Glucose Blood) .... Two Times A Day Testing 8)  Venlafaxine Hcl 75 Mg Xr24h-Tab (Venlafaxine Hcl) .... Take 1 Daily in Addition To The Venlafaxine Xr 150 Mg 9)  Kombiglyze Xr 11-998 Mg Xr24h-Tab (Saxagliptin-Metformin) .... Take 1 Daily With Evening Meal.  Discontinue Metformin. 10)  Lantus Solostar 100 Unit/ml Soln (Insulin Glargine) .... Inject 30 Units At Bedtime, or As Directed 11)  Relion Pen Needles 31g X 8 Mm Misc (Insulin Pen Needle) .... To Use With Daily Lantus Injection 12)  Tramadol Hcl 50 Mg Tabs (Tramadol Hcl) .... Take 1 Every 6 Hrs As Needed For Pain.  Allergies (verified): 1)  ! Codeine 2)  ! Ibuprofen  Past History:  Past medical history reviewed for relevance to current acute and chronic problems.  Past Medical History: Reviewed history from 09/30/2009 and no changes required.  Hypertension Depression Diabetes mellitus, type II  Review of Systems General:  Denies chills, fever, and loss of appetite. ENT:  Denies ear discharge and nasal congestion. CV:  Denies chest pain or discomfort and palpitations. Resp:  Denies cough and shortness of  breath. GI:  Complains of abdominal pain; denies change in bowel habits, loss of appetite, nausea, and vomiting.  Physical Exam  General:  Well-developed,well-nourished,in no acute distress; alert,appropriate and cooperative throughout examination Head:  Normocephalic and atraumatic without obvious abnormalities. No apparent alopecia or balding. Ears:  External ear exam shows no significant lesions or deformities.  Otoscopic examination reveals clear canals, tympanic membranes are intact bilaterally without bulging, retraction, inflammation or discharge. Hearing is grossly normal bilaterally. Nose:  External nasal examination shows no deformity or inflammation. Nasal mucosa are pink and  moist without lesions or exudates. Mouth:  Oral mucosa and oropharynx without lesions or exudates.  Teeth in good repair. Neck:  No deformities, masses, or tenderness noted. Lungs:  Normal respiratory effort, chest expands symmetrically. Lungs are clear to auscultation, no crackles or wheezes. Heart:  Normal rate and regular rhythm. S1 and S2 normal without gallop, murmur, click, rub or other extra sounds. Abdomen:  Bowel sounds positive,abdomen soft and non-tender without masses, organomegaly or hernias noted. Cervical Nodes:  No lymphadenopathy noted Psych:  Cognition and judgment appear intact. Alert and cooperative with normal attention span and concentration. No apparent delusions, illusions, hallucinations   Impression & Recommendations:  Problem # 1:  DIABETES MELLITUS, TYPE II (ICD-250.00) Assessment Improved  Her updated medication list for this problem includes:    Glyburide 5 Mg Tabs (Glyburide) .Marland Kitchen... 2 daily    Enalapril Maleate 10 Mg Tabs (Enalapril maleate) ..... One by mouth daily    Kombiglyze Xr 11-998 Mg Xr24h-tab (Saxagliptin-metformin) .Marland Kitchen... Take 1 daily with evening meal.  discontinue metformin.    Lantus Solostar 100 Unit/ml Soln (Insulin glargine) ..... Inject 30 units at bedtime, or as directed  Orders: T-Basic Metabolic Panel ([id number]) T- Hemoglobin A1C [id number])  Problem # 2:  HYPERTENSION (ICD-401.9)  Her updated medication list for this problem includes:    Enalapril Maleate 10 Mg Tabs (Enalapril maleate) ..... One by mouth daily    Furosemide 20 Mg Tabs (Furosemide) .Marland Kitchen... Take 1 q am as needed for swelling  Orders: T-Basic Metabolic Panel (99991111)  Problem # 3:  ABDOMINAL PAIN (ICD-789.00) Assessment: Improved  Problem # 4:  OBESITY (ICD-278.00) Assessment: Improved Pt is still exercising regularly, and watching her diet.  She is very happy with the wt loss & I have encouraged her to keep up the good work. Did discuss with pt the  importance of monitoring her Blood sugars at home, because with the wt loss she may need less insulin in the future as she continues to lose wt.  Complete Medication List: 1)  Effexor Xr 150 Mg Xr24h-cap (Venlafaxine hcl) .... Once daily 2)  Glyburide 5 Mg Tabs (Glyburide) .... 2 daily 3)  Enalapril Maleate 10 Mg Tabs (Enalapril maleate) .... One by mouth daily 4)  Bd Insulin Syringe Ultrafine 31g X 5/16" 1 Ml Misc (Insulin syringe-needle u-100) .... Use to inject insulin subcutaneously once daily 5)  Sumatriptan Succinate 100 Mg Tabs (Sumatriptan succinate) .... Take 1 at onset of migraine.  may take 2nd tab 2 hrs later if needed.  max 2 tabs/24 hrs. 6)  Furosemide 20 Mg Tabs (Furosemide) .... Take 1 q am as needed for swelling 7)  Fasttake Test Strp (Glucose blood) .... Two times a day testing 8)  Venlafaxine Hcl 75 Mg Xr24h-tab (Venlafaxine hcl) .... Take 1 daily in addition to the venlafaxine xr 150 mg 9)  Kombiglyze Xr 11-998 Mg Xr24h-tab (Saxagliptin-metformin) .... Take 1 daily with evening meal.  discontinue metformin. 10)  Lantus Solostar 100 Unit/ml Soln (Insulin glargine) .... Inject 30 units at bedtime, or as directed 11)  Relion Pen Needles 31g X 8 Mm Misc (Insulin pen needle) .... To use with daily lantus injection 12)  Tramadol Hcl 50 Mg Tabs (Tramadol hcl) .... Take 1 every 6 hrs as needed for pain.  Other Orders: T-Lipid Profile [id number]) Radiology Referral (Radiology)  Patient Instructions: 1)  Please schedule a follow-up appointment in 3 months. 2)  congratulations on your weight loss!  Keep up the good work. 3)  Continue monitoring your blood sugar daily. 4)  I have ordered blood work for you to have drawn in 3 mos before your next appt. 5)  We will refer you back to the surgeon you saw previously.

## 2010-08-23 NOTE — Progress Notes (Signed)
Summary: refill  Phone Note Call from Patient   Summary of Call: pt needs refill on effexor xr 150mg . [facility] [phone] Initial call taken by: Lenn Cal,  December 28, 2009 10:00 AM  Follow-up for Phone Call        Rx Called In Follow-up by: Baldomero Lamy LPN,  June  7, 624THL 075-GRM AM    Prescriptions: EFFEXOR XR 150 MG XR24H-CAP (VENLAFAXINE HCL) once daily  #30 x 2   Entered by:   Baldomero Lamy LPN   Authorized by:   Tula Nakayama MD   Signed by:   Baldomero Lamy LPN on 075-GRM   Method used:   Electronically to        [facility] (retail)       [address]       [place],   [address]       Ph: [phone]       Fax: [phone]   RxID:   [id number]

## 2010-08-23 NOTE — Letter (Signed)
Summary: [facility] ORDER   Imported By: [place] Crane 05/09/2010 15:32:44  _____________________________________________________________________  External Attachment:    Type:   Image     Comment:   External Document

## 2010-08-23 NOTE — Letter (Signed)
Summary: Laboratory/X-Ray Results  [facility]  [address]   [place], [place] [address]   Phone: [phone]  Fax: [phone]    Lab/X-Ray Results  Dec 20, 2009  MRN: [id number]  [facility] [address] [place], [place]  [address]    The results of your recent lab/x-ray has been reviewed and were found:  Cholesterol levels are very good.   If you have any questions, please contact our office.     Kennith Gain PA
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
## 2010-08-23 NOTE — Letter (Signed)
Summary: 1ST MISSED LETTER  1ST MISSED LETTER   Imported By: Dierdre Harness 07/01/2010 13:18:45  _____________________________________________________________________  External Attachment:    Type:   Image     Comment:   External Document

## 2010-08-23 NOTE — Miscellaneous (Signed)
Summary: ct of abdom and pelvis 12/07/2009  Clinical Lists Changes  Observations: Added new observation of CT OF ABDOME:  Clinical Data: Right upper quadrant and right lower quadrant   abdominal pain.  Pelvic pain.  Prior cholecystectomy, C-section,   hysterectomy.  Hypertension.  Diabetes.    CT ABDOMEN AND PELVIS WITH CONTRAST    Technique:  Multidetector CT imaging of the abdomen and pelvis was   performed following the standard protocol during bolus   administration of intravenous contrast.    Contrast: 100  ml Omnipaque-300    Comparison: 02/24/2009    Findings: Clear lung bases.  Normal heart size without pericardial   or pleural effusion.  Normal liver, stomach.  Splenule.  Normal   pancreas. Cholecystectomy without biliary ductal dilatation.   Normal right adrenal gland.  The left adrenal myelolipoma is   unchanged in size at 2.7 x 3.3 cm.  Left lower pole renal lesion   measures 1.1 cm and fluid density consistent with a tiny cyst.    Normal right kidney.  Small retroperitoneal lymph nodes. No   retroperitoneal or retrocrural adenopathy.    Normal colon  Normal terminal ileum and appendix.  Small bowel   otherwise normal without ascites. No pelvic adenopathy.    Normal   urinary bladder.  Hysterectomy.  No adnexal mass or significant   free fluid. A dystrophic calcification is identified within the   left ovary.   Similar nonspecific subcutaneous nodule within the right anterior   abdominal wall at approximately 9 mm on image 39.   No acute osseous abnormality.    IMPRESSION:    1. No acute process in the abdomen or pelvis.   2.  Similar size of a left adrenal myelolipoma.   3.  Cholecystectomy and hysterectomy.    Read By:  Areta Haber,  M.D. (12/07/2009 12:43)      CT of Abdomen  Procedure date:  12/07/2009  Findings:       Clinical Data: Right upper quadrant and right lower quadrant   abdominal pain.  Pelvic pain.  Prior cholecystectomy,  C-section,   hysterectomy.  Hypertension.  Diabetes.    CT ABDOMEN AND PELVIS WITH CONTRAST    Technique:  Multidetector CT imaging of the abdomen and pelvis was   performed following the standard protocol during bolus   administration of intravenous contrast.    Contrast: 100  ml Omnipaque-300    Comparison: 02/24/2009    Findings: Clear lung bases.  Normal heart size without pericardial   or pleural effusion.  Normal liver, stomach.  Splenule.  Normal   pancreas. Cholecystectomy without biliary ductal dilatation.   Normal right adrenal gland.  The left adrenal myelolipoma is   unchanged in size at 2.7 x 3.3 cm.  Left lower pole renal lesion   measures 1.1 cm and fluid density consistent with a tiny cyst.    Normal right kidney.  Small retroperitoneal lymph nodes. No   retroperitoneal or retrocrural adenopathy.    Normal colon  Normal terminal ileum and appendix.  Small bowel   otherwise normal without ascites. No pelvic adenopathy.    Normal   urinary bladder.  Hysterectomy.  No adnexal mass or significant   free fluid. A dystrophic calcification is identified within the   left ovary.   Similar nonspecific subcutaneous nodule within the right anterior   abdominal wall at approximately 9 mm on image 39.   No acute osseous abnormality.    IMPRESSION:  1. No acute process in the abdomen or pelvis.   2.  Similar size of a left adrenal myelolipoma.   3.  Cholecystectomy and hysterectomy.    Read By:  Areta Haber,  Jerilynn Mages.D.

## 2010-08-23 NOTE — Progress Notes (Signed)
  Phone Note Call from Patient   Caller: Patient Summary of Call: patient states nothing was sent in for her migraine headaches, discharge summary does state somthing was to be sent in Initial call taken by: Baldomero Lamy LPN,  March 11, 624THL 3:12 PM    New/Updated Medications: SUMATRIPTAN SUCCINATE 100 MG TABS (SUMATRIPTAN SUCCINATE) Take 1 at onset of migraine.  May take 2nd tab 2 hrs later if needed.  Max 2 tabs/24 hrs. Prescriptions: SUMATRIPTAN SUCCINATE 100 MG TABS (SUMATRIPTAN SUCCINATE) Take 1 at onset of migraine.  May take 2nd tab 2 hrs later if needed.  Max 2 tabs/24 hrs.  #9 x 1   Entered and Authorized by:   Kennith Gain PA   Signed by:   Kennith Gain PA on 10/04/2009   Method used:   Electronically to        [facility] (retail)       [address]       [place], [place]  [address]       Ph: [phone]       Fax: [phone]   RxID:   [id number]   Appended Document:  called patient, left message  Appended Document:  called patient, left message

## 2010-08-23 NOTE — Progress Notes (Signed)
  Phone Note Outgoing Call   Summary of Call: I had ordered Lipid panel on last set of labs, but wasnt done.  Pls order & notify pt to have drawn fasting. Initial call taken by: Kennith Gain PA,  Dec 16, 2009 2:28 PM  Follow-up for Phone Call        labs ordered, called patient, left message Follow-up by: Baldomero Lamy LPN,  May 26, 624THL [id number] PM

## 2010-08-23 NOTE — Progress Notes (Signed)
Summary: STRAP THROAT , FEVER  Phone Note Call from Patient   Summary of Call: THINKS SHE HAS STRAP THROAT HAS BEEN AROUND WITH SOMEONE WHO HAS HAD IT AND RUNNING A FEVER STOPPED UP NOSE AND ACHING WANST TO BE SEN  CALL BACK AT [phone] CELL # [phone] Initial call taken by: Dierdre Harness,  June 22, 2010 9:12 AM  Follow-up for Phone Call        advised patient we have no opening and with fever she needs to be seen asap, advised patient to go to urgent care and she agrees Follow-up by: Baldomero Lamy LPN,  November 30, 624THL 9:31 AM

## 2010-08-23 NOTE — Assessment & Plan Note (Signed)
Summary: follow up- room 1   Vital Signs:  Patient profile:   50 year old female Height:      63 inches Weight:      258.75 pounds BMI:     46.00 O2 Sat:      97 % on Room air Pulse rate:   107 / minute Pulse (ortho):   112 / minute Resp:     16 per minute BP sitting:   110 / 60  (left arm) BP standing:   120 / 70  Vitals Entered By: Baldomero Lamy LPN (October  3, 624THL 3:19 PM)  Nutrition Counseling: Patient's BMI is greater than 25 and therefore counseled on weight management options.  Serial Vital Signs/Assessments:  Time      Position  BP       Pulse  Resp  Temp     By           Lying LA  120/70   103                   Baldomero Lamy LPN           Sitting   130/70   110                   Baldomero Lamy LPN           Standing  120/70   112                   Baldomero Lamy LPN  CC: follow-up visit Is Patient Diabetic? Yes CBG Result 271 Comments did not bring meds to ov   Referring Provider:  Verneda Skill, [facility] ED Primary Provider:  Kennith Gain PA  CC:  follow-up visit.  History of Present Illness: Pt presents today for check up.  Pt reports 2 week hx of chest discomfort.  Notices that she has belching when she has the chest discomfort.  Intermittently.  No nausea or vomiting.  No abd pain.  Lt  chest pain, mostly at Lt sternal border no radiation.  + diaphoresis.  Hx of DM.  Taking medications as prescribed and denies side effects. No episodes of hypoglycemia. Checking blood sugar at home.  Fasting blood sugars average 300s again. This am 362. Last eye exam - none.  Plans to schedule. Taking ASA daily. Pneumovax UTD.  Dizziness - 4 episodes in last 2 weeks.  Vertigo.  Usually occurs when standing or walking.  Lasts few minutes then resolves. After the dizziness noticed a tingling numb feeling Rt face.  Last for a few minutes then resolved.    Allergies (verified): 1)  ! Codeine 2)  ! Ibuprofen  Past History:  Past medical history reviewed for relevance to current  acute and chronic problems.  Past Medical History: Reviewed history from 09/30/2009 and no changes required.  Hypertension Depression Diabetes mellitus, type II  Review of Systems General:  Complains of fatigue; denies chills and fever. Eyes:  Complains of blurring; denies double vision. ENT:  Denies earache, nasal congestion, postnasal drainage, ringing in ears, sinus pressure, and sore throat. CV:  Complains of chest pain or discomfort and palpitations. Resp:  Denies cough and shortness of breath. GI:  Complains of indigestion; denies abdominal pain, nausea, and vomiting. Neuro:  Complains of headaches and tingling; denies numbness; HX OF MIGRAINES. Marland Kitchen  Physical Exam  General:  Well-developed,well-nourished,in no acute distress; alert,appropriate and cooperative throughout examination Head:  Normocephalic and atraumatic without obvious abnormalities. No apparent  alopecia or balding. Eyes:  No corneal or conjunctival inflammation noted. EOMI. Perrla. Funduscopic exam benign, without hemorrhages, exudates or papilledema. Ears:  External ear exam shows no significant lesions or deformities.  Otoscopic examination reveals clear canals, tympanic membranes are intact bilaterally without bulging, retraction, inflammation or discharge. Hearing is grossly normal bilaterally. Nose:  External nasal examination shows no deformity or inflammation. Nasal mucosa are pink and moist without lesions or exudates. Mouth:  Oral mucosa and oropharynx without lesions or exudates.   Neck:  No deformities, masses, or tenderness noted.no carotid bruits.   Lungs:  Normal respiratory effort, chest expands symmetrically. Lungs are clear to auscultation, no crackles or wheezes. Heart:  Normal rate and regular rhythm. S1 and S2 normal without gallop, murmur, click, rub or other extra sounds. Cervical Nodes:  No lymphadenopathy noted Psych:  Cognition and judgment appear intact. Alert and cooperative with normal  attention span and concentration. No apparent delusions, illusions, hallucinations   Impression & Recommendations:  Problem # 1:  CHEST PAIN, ATYPICAL (ICD-786.59) Assessment New  Orders: Cardiology Referral (Cardiology)  Problem # 2:  DIZZINESS (ICD-780.4) Assessment: New  Orders: MRI with contrast (MRI w/ contrast) Cardiology Referral (Cardiology) T-CBC No Diff [id number])  Problem # 3:  DIABETES MELLITUS, TYPE II (ICD-250.00) Assessment: Deteriorated  Her updated medication list for this problem includes:    Glyburide 5 Mg Tabs (Glyburide) .Marland Kitchen... 2 daily    Enalapril Maleate 10 Mg Tabs (Enalapril maleate) ..... One by mouth daily    Kombiglyze Xr 11-998 Mg Xr24h-tab (Saxagliptin-metformin) .Marland Kitchen... Take 1 daily with evening meal.  discontinue metformin.    Lantus Solostar 100 Unit/ml Soln (Insulin glargine) ..... Inject 30 units at bedtime, or as directed  Orders: Glucose, (CBG) [id number])  Labs Reviewed: Creat: 0.49 (12/03/2009)    Reviewed HgBA1c results: 6.8 (12/03/2009)  Problem # 4:  HYPERTENSION (ICD-401.9) Assessment: Comment Only  Her updated medication list for this problem includes:    Enalapril Maleate 10 Mg Tabs (Enalapril maleate) ..... One by mouth daily    Furosemide 20 Mg Tabs (Furosemide) .Marland Kitchen... Take 1 q am as needed for swelling  Orders: Cardiology Referral (Cardiology)  BP today: 110/60 Prior BP: 126/70 (12/17/2009)  Labs Reviewed: K+: 4.4 (12/03/2009) Creat: : 0.49 (12/03/2009)   Chol: 163 (12/17/2009)   HDL: 43 (12/17/2009)   LDL: 92 (12/17/2009)   TG: 141 (12/17/2009)  Complete Medication List: 1)  Glyburide 5 Mg Tabs (Glyburide) .... 2 daily 2)  Enalapril Maleate 10 Mg Tabs (Enalapril maleate) .... One by mouth daily 3)  Bd Insulin Syringe Ultrafine 31g X 5/16" 1 Ml Misc (Insulin syringe-needle u-100) .... Use to inject insulin subcutaneously once daily 4)  Sumatriptan Succinate 100 Mg Tabs (Sumatriptan succinate) .... Take 1 at onset of  migraine.  may take 2nd tab 2 hrs later if needed.  max 2 tabs/24 hrs. 5)  Furosemide 20 Mg Tabs (Furosemide) .... Take 1 q am as needed for swelling 6)  Fasttake Test Strp (Glucose blood) .... Two times a day testing 7)  Venlafaxine Hcl 75 Mg Xr24h-tab (Venlafaxine hcl) .... Take 3 daily for depression 8)  Kombiglyze Xr 11-998 Mg Xr24h-tab (Saxagliptin-metformin) .... Take 1 daily with evening meal.  discontinue metformin. 9)  Lantus Solostar 100 Unit/ml Soln (Insulin glargine) .... Inject 30 units at bedtime, or as directed 10)  Relion Pen Needles 31g X 8 Mm Misc (Insulin pen needle) .... To use with daily lantus injection 11)  Tramadol Hcl 50 Mg Tabs (Tramadol hcl) .... Take  1 every 6 hrs as needed for pain. 12)  Fluconazole 150 Mg Tabs (Fluconazole) .... Take 1daily by mouth as needed for yeast infection 13)  Omeprazole 20 Mg Cpdr (Omeprazole) .... Take 1 daily for indigestion  Other Orders: T-Comprehensive Metabolic Panel ([id number]) T- Hemoglobin A1C [id number])  Patient Instructions: 1)  Please schedule a follow-up appointment in 2 weeks. 2)  Have blood work drawn fasting. 3)  Increase your Lantus by 3 units every 3 days until fasting blood sugar is < 150. 4)  Bring your blood sugar readings with you to your appt. 5)  I have referred you to a cardiologist. 6)  I have ordered an MRI of your brain. 7)  I have started you on Omeprazole. Prescriptions: OMEPRAZOLE 20 MG CPDR (OMEPRAZOLE) take 1 daily for indigestion  #30 x 3   Entered and Authorized by:   Kennith Gain PA   Signed by:   Kennith Gain PA on 04/25/2010   Method used:   Electronically to        [facility] (retail)       [place] [address]       [place], [place]  [address]       Ph: [phone]       Fax: [phone]   RxID:   [id number] FLUCONAZOLE 150 MG TABS (FLUCONAZOLE) take 1daily by mouth as needed for yeast infection  #4 x 0   Entered and Authorized by:   Kennith Gain PA    Signed by:   Kennith Gain PA on 04/25/2010   Method used:   Electronically to        [facility] (retail)       [address]       [place], [place]  [address]       Ph: [phone]       Fax: [phone]   RxID:   [id number] VENLAFAXINE HCL 75 MG XR24H-TAB (VENLAFAXINE HCL) take 3 daily for depression  #90 x 3   Entered and Authorized by:   Kennith Gain PA   Signed by:   Kennith Gain PA on 04/25/2010   Method used:   Electronically to        [facility] (retail)       [address]       [place], [place]  [address]       Ph: [phone]       Fax: [phone]   RxID:   [id number] FUROSEMIDE 20 MG TABS (FUROSEMIDE) take 1 q am as needed for swelling  #30 x 0   Entered and Authorized by:   Kennith Gain PA   Signed by:   Kennith Gain PA on 04/25/2010   Method used:   Electronically to        [facility] (retail)       [address]       [place], [place]  [address]       Ph: [phone]       Fax: [phone]   RxID:   [id number] ENALAPRIL MALEATE 10 MG TABS (ENALAPRIL MALEATE) one by mouth daily  #30 Each x 3   Entered and Authorized by:   Kennith Gain PA   Signed by:   Kennith Gain  PA on 04/25/2010   Method used:   Electronically to        [facility] (retail)       [address]       [place], [place]  [address]       Ph: [phone]       Fax: [phone]   RxID:   [id number] GLYBURIDE 5 MG TABS (GLYBURIDE) 2 daily  #60 Each x 3   Entered and Authorized by:   Kennith Gain PA   Signed by:   Kennith Gain PA on 04/25/2010   Method used:   Electronically to        [facility] (retail)       [address]       [place], [place]  [address]       Ph: [phone]       Fax: [phone]   RxID:   [id number]   Laboratory Results   Blood Tests   Date/Time Received: April 25, 2010 3:52 PM  Date/Time Reported:  April 25, 2010 3:52 PM   CBG Random:: [id number]

## 2010-08-23 NOTE — Miscellaneous (Signed)
Summary: labs cmp,cbcd,12/03/2009  Clinical Lists Changes  Observations: Added new observation of CALCIUM: 8.7 mg/dL (12/03/2009 12:30) Added new observation of ALBUMIN: 4.2 g/dL (12/03/2009 12:30) Added new observation of PROTEIN, TOT: 6.7 g/dL (12/03/2009 12:30) Added new observation of SGPT (ALT): 15 units/L (12/03/2009 12:30) Added new observation of SGOT (AST): 13 units/L (12/03/2009 12:30) Added new observation of ALK PHOS: 66 units/L (12/03/2009 12:30) Added new observation of GFR AA: >60 mL/min/1.9m2 (12/03/2009 12:30) Added new observation of GFR: >60 mL/min (12/03/2009 12:30) Added new observation of CREATININE: 0.49 mg/dL (12/03/2009 12:30) Added new observation of BUN: 15 mg/dL (12/03/2009 12:30) Added new observation of BG RANDOM: 57 mg/dL (12/03/2009 12:30) Added new observation of CO2 PLSM/SER: 24 meq/L (12/03/2009 12:30) Added new observation of CL SERUM: 105 meq/L (12/03/2009 12:30) Added new observation of K SERUM: 4.4 meq/L (12/03/2009 12:30) Added new observation of NA: 140 meq/L (12/03/2009 12:30) Added new observation of ABSOLUTE BAS: 0.0 K/uL (12/03/2009 12:30) Added new observation of BASOPHIL %: 1 % (12/03/2009 12:30) Added new observation of EOS ABSLT: 0.1 K/uL (12/03/2009 12:30) Added new observation of % EOS AUTO: 1 % (12/03/2009 12:30) Added new observation of ABSOLUTE MON: 0.6 K/uL (12/03/2009 12:30) Added new observation of MONOCYTE %: 6 % (12/03/2009 12:30) Added new observation of ABS LYMPHOCY: 2.7 K/uL (12/03/2009 12:30) Added new observation of LYMPHS %: 30 % (12/03/2009 12:30) Added new observation of PLATELETK/UL: 237 K/uL (12/03/2009 12:30) Added new observation of RDW: 13.9 % (12/03/2009 12:30) Added new observation of MCHC RBC: 32.8 g/dL (12/03/2009 12:30) Added new observation of MCV: 94.0 fL (12/03/2009 12:30) Added new observation of HCT: 43.9 % (12/03/2009 12:30) Added new observation of HGB: 14.4 g/dL (12/03/2009 12:30) Added new  observation of RBC M/UL: 4.67 M/uL (12/03/2009 12:30) Added new observation of WBC COUNT: 8.9 10*3/microliter (12/03/2009 12:30)

## 2010-08-23 NOTE — Assessment & Plan Note (Signed)
Summary: np6 chest [pain   Visit Type:  Initial Consult Referring Provider:  . Primary Provider:  Kennith Gain PA   History of Present Illness: [name] is seen for an initial visit at the kind request of Ms. Kennith Gain, P.A.-C. for evaluation of chest discomfort and palpitations.  This nice woman has a miriad of complaints including sinus pressure, right ear discomfort, bilateral leg pain, leg diameter discrepancy, palpitations, chest discomfort and exertional dyspnea.  Since beginning a PPI, chest discomfort has resolved.  MRI Brain  Procedure date:  04/28/2010  Findings:         Clinical Data: Dizziness.  Headaches.  Facial numbness.  Blurred   vision.    MRI HEAD WITHOUT AND WITH CONTRAST    Technique:  Multiplanar, multiecho pulse sequences of the brain and   surrounding structures were obtained according to standard protocol   without and with intravenous contrast    Contrast: 20 ml Multihance.    Comparison: None.    Findings: No acute infarct, hemorrhage, mass lesion, hydrocephalus,   or significant extra-axial fluid collection is present.  The   postcontrast images reveal no areas of pathologic enhancement.   Flow is present in the major intracranial arteries.  The globes and   orbits are intact.  The paranasal sinuses and mastoid air cells are   clear.    IMPRESSION:   Negative MRI of the brain.    Read By:  Arna Snipe,  MD   EKG  Procedure date:  05/30/2010  Findings:      Normal sinus rhythm Delayed R-wave progression Minor J-point elevation in the lateral leads  Comparison to prior study performed 04/25/2010: No significant interval change   Current Medications (verified): 1)  Glyburide 5 Mg Tabs (Glyburide) .... 2 Daily 2)  Enalapril Maleate 10 Mg Tabs (Enalapril Maleate) .... One By Mouth Daily 3)  Bd Insulin Syringe Ultrafine 31g X 5/16" 1 Ml Misc (Insulin Syringe-Needle U-100) .... Use To Inject Insulin  Subcutaneously Once Daily 4)  Sumatriptan Succinate 100 Mg Tabs (Sumatriptan Succinate) .... Take 1 At Onset of Migraine.  May Take 2nd Tab 2 Hrs Later If Needed.  Max 2 Tabs/24 Hrs. 5)  Furosemide 20 Mg Tabs (Furosemide) .... Take 1 Q Am As Needed For Swelling 6)  Fasttake Test  Strp (Glucose Blood) .... Two Times A Day Testing 7)  Venlafaxine Hcl 75 Mg Xr24h-Tab (Venlafaxine Hcl) .... Take 3 Daily For Depression 8)  Kombiglyze Xr 11-998 Mg Xr24h-Tab (Saxagliptin-Metformin) .... Take 1 Daily With Evening Meal.  Discontinue Metformin. 9)  Lantus Solostar 100 Unit/ml Soln (Insulin Glargine) .... Inject 30 Units At Bedtime, or As Directed 10)  Relion Pen Needles 31g X 8 Mm Misc (Insulin Pen Needle) .... To Use With Daily Lantus Injection 11)  Tramadol Hcl 50 Mg Tabs (Tramadol Hcl) .... Take 1 Every 6 Hrs As Needed For Pain. 12)  Fluconazole 150 Mg Tabs (Fluconazole) .... Take 1daily By Mouth As Needed For Yeast Infection 13)  Omeprazole 20 Mg Cpdr (Omeprazole) .... Take 1 Daily For Indigestion 14)  Cortisporin 3.5-10000-1 Soln (Neomycin-Polymyxin-Hc) .... 4 Gtts in Right Ear Three Times A Day X 7 Days  Allergies (verified): 1)  ! Codeine 2)  ! Ibuprofen  Comments:  Nurse/Medical Assistant: patient reviewed med list from [facility] and stated all meds  were correct no bottles no list new to our practice.  Past History:  Family History: Last updated: 05-30-10 Father-died at age 55 as the  result of alcohol abuse Mother-died at age 62 with acute myocardial infarction and complications of diabetes Siblings: 2 brothers, one with diabetes and both with cardiac problems; 3 sisters, one with a heart condition.  No FH of CRC or IBD. Maternal aunt died of liver dz. Family History of Diabetes Family History of Heart Disease:   Social History: Last updated: 05/04/2010 Married.  3 grown children.  Employment: Engineer, technical sales Hospital-custodial services Tobacco-never Alcohol Use -  no Illicit Drug Use - no  Past Medical History: Hypertension Chest pain Palpitations Depression Diabetes mellitus, type II-requires insulin Obesity Migraine headache  Past Surgical History: Partial hysterectomy Cholecystectomy C-section X 2 Left hand, cyst Colonoscopy-2011  Family History: Father-died at age 14 as the result of alcohol abuse Mother-died at age 21 with acute myocardial infarction and complications of diabetes Siblings: 2 brothers, one with diabetes and both with cardiac problems; 3 sisters, one with a heart condition.  No FH of CRC or IBD. Maternal aunt died of liver dz. Family History of Diabetes Family History of Heart Disease:   Social History: Married.  3 grown children.  Employment: [employer]-custodial services Tobacco-never Alcohol Use - no Illicit Drug Use - no  Review of Systems       H/O colitis; intermittent edema of the feet and ankles; all other systems reviewed and are negative.  Vital Signs:  Patient profile:   50 year old female Weight:      258 pounds BMI:     45.87 Pulse rate:   108 / minute BP sitting:   116 / 66  (right arm)  Vitals Entered By: Doretha Sou, CNA (May 04, 2010 1:11 PM)  Physical Exam  General:  Obese; well-developed; no acute distress; mildly anxious HEENT-[place]/AT; PERRL; EOM intact; conjunctiva and lids nl; erythema and increased vascularity of the mid- tympanic membrane Neck-No JVD; no carotid bruits: Endocrine-No thyromegaly: Lungs-No tachypnea, clear without rales, rhonchi or wheezes: CV-normal PMI; normal S1 and S2; modest basilar systolic ejection murmur Abdomen-BS normal; soft and non-tender without masses or organomegaly: MS-No deformities, cyanosis or clubbing: Neurologic-Nl cranial nerves; symmetric strength and tone: Skin- Warm, no sig. lesions: Extremities-Nl distal pulses; trace edema; left leg measures 38 cm 10 cm below the tibial plateau, 41 cm on the right    Impression &  Recommendations:  Problem # 1:  CHEST PAIN, ATYPICAL (ICD-786.59) Cardiovascular risk factors are moderate.  Menopausal status is unknown, but she has not had  suggestive symptoms.  A recent lipid profile was better than average.  She uses no tobacco products but she does have diabetes with suboptimal control and hypertension.  Symptoms have now resolved after treatment for a presumed GI etiology.  We will defer further testing for coronary disease for the time being.  She would benefit from statin therapy in light of her diabetes.  Patient also has palpitations.  These are causing her a degree of distress, but this is fairly minor.  We will proceed with an event recorder to identify her arrhythmias, if any.  She describes episodes of dizziness, but these sound more like vertigo.  She describes ear pain and possible sinus congestion.  Evaluation by an ENT physician may be helpful at some point.  She had some erythema of her right tympanic membrane and will be treated with an otic preparation that includes antibiotic and corticosteroids.  I will plan to reassess this nice woman in one month.  Patient Instructions: 1)  Your physician recommends that you schedule a follow-up  appointment in: 1 month 2)  Your physician has recommended you make the following change in your medication: cortisporin eardrops 4 drops in right ear three times a day x 7 days 3)  Your physician has recommended that you wear an event monitor.  Event monitors are medical devices that record the heart's electrical activity. Doctors most often use these monitors to diagnose arrhythmias. Arrhythmias are problems with the speed or rhythm of the heartbeat. The monitor is a small, portable device. You can wear one while you do your normal daily activities. This is usually used to diagnose what is causing palpitations/syncope (passing out). Prescriptions: CORTISPORIN 3.5-10000-1 SOLN (NEOMYCIN-POLYMYXIN-HC) 4 gtts in right ear three times  a day x 7 days  #1 x 0   Entered by:   Tye Savoy RN   Authorized by:   Yehuda Savannah, MD, [facility]   Signed by:   Tye Savoy RN on 05/04/2010   Method used:   Electronically to        [facility] (retail)       [address]       [place],   [address]       Ph: [phone]       Fax: [phone]   RxID:   [id number]

## 2010-08-23 NOTE — Progress Notes (Signed)
Summary: blood sugars  Phone Note Call from Patient   Summary of Call: daughter called miranda and deb bsugar thsi weekend  [phone] to [phone] and this moring it was 450 call back at   [phone] Initial call taken by: Dierdre Harness,  June 27, 2010 10:50 AM  Follow-up for Phone Call        needs to go to urgent  no appts available today, when she gets an appts to come in later this week, she neds to come, she has missed several appts in the pat pls explain and let her know Follow-up by: Tula Nakayama MD,  June 27, 2010 12:26 PM  Additional Follow-up for Phone Call Additional follow up Details #1::        LEFT MESSAGE Additional Follow-up by: Dierdre Harness,  June 27, 2010 1:06 PM    Additional Follow-up for Phone Call Additional follow up Details #2::    SPOKE WITH DAUGHTER WILL BE COMING IN [place] Follow-up by: Dierdre Harness,  June 27, 2010 3:39 PM

## 2010-08-23 NOTE — Letter (Signed)
Summary: Letter  Letter   Imported By: Dierdre Harness 04/29/2010 15:25:54  _____________________________________________________________________  External Attachment:    Type:   Image     Comment:   External Document

## 2010-08-23 NOTE — Progress Notes (Signed)
Summary: refills   Phone Note Call from Patient   Summary of Call: pt needs here insulin injection meds called into [facility] [phone] please call her first.  Initial call taken by: Lenn Cal,  October 22, 2009 9:22 AM  Follow-up for Phone Call        called patient, left message Follow-up by: Baldomero Lamy LPN,  April  4, 624THL 11:02 AM  Additional Follow-up for Phone Call Additional follow up Details #1::        returned call, left message Additional Follow-up by: Baldomero Lamy LPN,  April  6, 624THL 10:28 AM

## 2010-08-23 NOTE — Letter (Signed)
Summary: Letter  Letter   Imported By: Dierdre Harness 11/18/2009 14:24:50  _____________________________________________________________________  External Attachment:    Type:   Image     Comment:   External Document

## 2010-08-25 NOTE — Letter (Signed)
Summary: medical release  medical release   Imported By: Dierdre Harness 07/08/2010 14:14:39  _____________________________________________________________________  External Attachment:    Type:   Image     Comment:   External Document

## 2010-08-31 ENCOUNTER — Other Ambulatory Visit (HOSPITAL_COMMUNITY): Payer: Self-pay | Admitting: Family Medicine

## 2010-08-31 ENCOUNTER — Ambulatory Visit (HOSPITAL_COMMUNITY)
Admission: RE | Admit: 2010-08-31 | Discharge: 2010-08-31 | Disposition: A | Discharge status: Home / Self Care | Payer: Commercial Managed Care - PPO | Source: Ambulatory Visit | Attending: Family Medicine | Admitting: Family Medicine

## 2010-08-31 DIAGNOSIS — M79671 Pain in right foot: Secondary | ICD-10-CM

## 2010-08-31 DIAGNOSIS — M25579 Pain in unspecified ankle and joints of unspecified foot: Secondary | ICD-10-CM | POA: Insufficient documentation

## 2010-09-05 ENCOUNTER — Ambulatory Visit (HOSPITAL_COMMUNITY): Payer: Self-pay | Admitting: Psychiatry

## 2010-10-03 LAB — GLUCOSE, CAPILLARY: Glucose-Capillary: 208 mg/dL — ABNORMAL HIGH (ref 70–99)

## 2010-10-03 LAB — RAPID URINE DRUG SCREEN, HOSP PERFORMED
Amphetamines: NOT DETECTED
Benzodiazepines: NOT DETECTED
Tetrahydrocannabinol: NOT DETECTED

## 2010-10-03 LAB — CBC
MCV: 91.6 fL (ref 78.0–100.0)
Platelets: 214 10*3/uL (ref 150–400)
RBC: 4.43 MIL/uL (ref 3.87–5.11)
RDW: 13.5 % (ref 11.5–15.5)
WBC: 7.8 10*3/uL (ref 4.0–10.5)

## 2010-10-03 LAB — BASIC METABOLIC PANEL
BUN: 13 mg/dL (ref 6–23)
Calcium: 9.1 mg/dL (ref 8.4–10.5)
Chloride: 107 mEq/L (ref 96–112)
Creatinine, Ser: 0.54 mg/dL (ref 0.4–1.2)
GFR calc Af Amer: >60 mL/min (ref >60)

## 2010-10-03 LAB — ETHANOL: Alcohol, Ethyl (B): <5 mg/dL (ref 0–10)

## 2010-10-03 LAB — DIFFERENTIAL
Basophils Absolute: 0.1 10*3/uL (ref 0.0–0.1)
Eosinophils Absolute: 0.1 10*3/uL (ref 0.0–0.7)
Eosinophils Relative: 2 % (ref 0–5)
Lymphocytes Relative: 29 % (ref 12–46)
Lymphs Abs: 2.2 10*3/uL (ref 0.7–4.0)
Neutrophils Relative %: 62 % (ref 43–77)

## 2010-10-03 LAB — ACETAMINOPHEN LEVEL: Acetaminophen (Tylenol), Serum: <10 ug/mL — ABNORMAL LOW (ref 10–30)

## 2010-10-03 LAB — SALICYLATE LEVEL: Salicylate Lvl: <4 mg/dL (ref 2.8–20.0)

## 2010-10-29 LAB — DIFFERENTIAL
Eosinophils Relative: 1 % (ref 0–5)
Lymphocytes Relative: 17 % (ref 12–46)
Lymphs Abs: 1.2 10*3/uL (ref 0.7–4.0)
Monocytes Absolute: 0.4 10*3/uL (ref 0.1–1.0)

## 2010-10-29 LAB — CBC
MCHC: 35.3 g/dL (ref 30.0–36.0)
MCV: 92.6 fL (ref 78.0–100.0)
Platelets: 221 10*3/uL (ref 150–400)
WBC: 7.2 10*3/uL (ref 4.0–10.5)

## 2010-10-29 LAB — COMPREHENSIVE METABOLIC PANEL
ALT: 15 U/L (ref 0–35)
AST: 15 U/L (ref 0–37)
Albumin: 3.6 g/dL (ref 3.5–5.2)
Calcium: 8.9 mg/dL (ref 8.4–10.5)
Chloride: 107 mEq/L (ref 96–112)
Creatinine, Ser: 0.46 mg/dL (ref 0.4–1.2)
GFR calc Af Amer: >60 mL/min (ref >60)
Sodium: 140 mEq/L (ref 135–145)

## 2011-01-29 ENCOUNTER — Emergency Department (HOSPITAL_COMMUNITY)
Admission: EM | Admit: 2011-01-29 | Discharge: 2011-01-29 | Disposition: A | Discharge status: Home / Self Care | Payer: 59 | Attending: Emergency Medicine | Admitting: Emergency Medicine

## 2011-01-29 ENCOUNTER — Encounter: Payer: Self-pay | Admitting: *Deleted

## 2011-01-29 DIAGNOSIS — F329 Major depressive disorder, single episode, unspecified: Secondary | ICD-10-CM | POA: Insufficient documentation

## 2011-01-29 DIAGNOSIS — R739 Hyperglycemia, unspecified: Secondary | ICD-10-CM

## 2011-01-29 DIAGNOSIS — I10 Essential (primary) hypertension: Secondary | ICD-10-CM | POA: Insufficient documentation

## 2011-01-29 DIAGNOSIS — F32A Depression, unspecified: Secondary | ICD-10-CM | POA: Insufficient documentation

## 2011-01-29 DIAGNOSIS — E119 Type 2 diabetes mellitus without complications: Secondary | ICD-10-CM | POA: Insufficient documentation

## 2011-01-29 DIAGNOSIS — N390 Urinary tract infection, site not specified: Secondary | ICD-10-CM | POA: Insufficient documentation

## 2011-01-29 HISTORY — DX: Essential (primary) hypertension: I10

## 2011-01-29 HISTORY — DX: Depression, unspecified: F32.A

## 2011-01-29 HISTORY — DX: Major depressive disorder, single episode, unspecified: F32.9

## 2011-01-29 LAB — URINE MICROSCOPIC-ADD ON

## 2011-01-29 LAB — URINALYSIS, ROUTINE W REFLEX MICROSCOPIC
Glucose, UA: NEGATIVE mg/dL
Specific Gravity, Urine: >1.03 — ABNORMAL HIGH (ref 1.005–1.030)
pH: 5 (ref 5.0–8.0)

## 2011-01-29 LAB — GLUCOSE, CAPILLARY

## 2011-01-29 MED ORDER — SULFAMETHOXAZOLE-TRIMETHOPRIM 800-160 MG PO TABS: 1.0000 | ORAL_TABLET | Freq: Two times a day (BID) | ORAL | Status: AC

## 2011-01-29 NOTE — ED Notes (Signed)
Has been dizzy x 2 days.  States woke up this morning with blood in urine, lower abd pain.  Denies n/v/d.

## 2011-01-29 NOTE — ED Notes (Signed)
cbg completed on pt. Pt resting now. NAD noted at this time.

## 2011-01-29 NOTE — ED Provider Notes (Signed)
History     Chief Complaint  Patient presents with  . Hematuria  . Abdominal Pain    started this morning  . Headache   Patient is a 50 y.o. female presenting with hematuria. The history is provided by the patient.  Hematuria This is a new problem. The current episode started today. The problem is unchanged. She describes the hematuria as gross hematuria. Irritative symptoms include frequency. Associated symptoms include abdominal pain and dysuria. Pertinent negatives include no chills, fever, flank pain, nausea or vomiting. (Diabetes, hysterectomy. )  Patient also notes associated suprapubic pain aggravated with urination and palpation. Denies vaginal bleeding, vaginal d/c. Patient reports h/o diabetes and states she blood glucose levels have been uncontrolled recently and have been measured in the 500s recently.   Past Medical History  Diagnosis Date  . Hypertension   . Diabetes mellitus   . Depression     Past Surgical History  Procedure Date  . Cholecystectomy   . Cesarean section   . Abdominal hysterectomy     Family History  Problem Relation Age of Onset  . Diabetes Mother   . Heart failure Mother   . Hypertension Mother   . Cancer Mother   . Diabetes Father     History  Substance Use Topics  . Smoking status: Never Smoker   . Smokeless tobacco: Never Used  . Alcohol Use: No    OB History    Grav Para Term Preterm Abortions TAB SAB Ect Mult Living   5 3 3  2            Review of Systems  Constitutional: Negative for fever and chills.  Gastrointestinal: Positive for abdominal pain. Negative for nausea and vomiting.  Genitourinary: Positive for dysuria and frequency. Negative for flank pain.  All other systems reviewed and are negative.    Physical Exam  BP 147/73  Pulse 97  Temp(Src) 98.1 F (36.7 C) (Oral)  Resp 22  Ht 5\' 5"  (1.651 m)  Wt 247 lb (112.038 kg)  BMI 41.10 kg/m2  SpO2 99%  Physical Exam  Constitutional: She is oriented to  person, place, and time. She appears well-developed and well-nourished.  HENT:  Head: Normocephalic and atraumatic.  Nose: Nose normal.  Eyes: Conjunctivae are normal.  Neck: Neck supple.  Cardiovascular: Normal rate and regular rhythm.   Pulmonary/Chest: Effort normal and breath sounds normal.  Abdominal: Soft. Bowel sounds are normal. She exhibits no distension. There is tenderness in the suprapubic area. There is no CVA tenderness.  Musculoskeletal: Normal range of motion.  Neurological: She is alert and oriented to person, place, and time.  Skin: Skin is warm and dry.  Psychiatric: She has a normal mood and affect. Her behavior is normal.    ED Course  Procedures  MDM Symptoms consistent with uncomplicated UTI. Pt is hyperglycemic, but states her sugars often run high. She has PCP who is adjusting her meds for same.    I personally performed the services described in the documentation, which were scribed in my presence. The recorded information has been reviewed and considered.     UA pos for infection. Will send for culture. Advised to followup with PCP for recheck in a week.    Charles B. Karle Starch, MD 01/29/11 [phone]

## 2014-05-25 ENCOUNTER — Encounter: Payer: Self-pay | Admitting: *Deleted

## 2017-07-11 ENCOUNTER — Encounter (HOSPITAL_COMMUNITY): Payer: Self-pay | Admitting: Emergency Medicine

## 2017-07-11 ENCOUNTER — Other Ambulatory Visit: Payer: Self-pay

## 2017-07-11 ENCOUNTER — Emergency Department (HOSPITAL_COMMUNITY): Payer: Self-pay

## 2017-07-11 ENCOUNTER — Ambulatory Visit (HOSPITAL_COMMUNITY)
Admission: EM | Admit: 2017-07-11 | Discharge: 2017-07-12 | Disposition: A | Discharge status: Home / Self Care | Payer: Self-pay | Attending: Cardiology | Admitting: Cardiology

## 2017-07-11 DIAGNOSIS — R0789 Other chest pain: Secondary | ICD-10-CM | POA: Insufficient documentation

## 2017-07-11 DIAGNOSIS — I10 Essential (primary) hypertension: Secondary | ICD-10-CM | POA: Insufficient documentation

## 2017-07-11 DIAGNOSIS — E119 Type 2 diabetes mellitus without complications: Secondary | ICD-10-CM | POA: Diagnosis present

## 2017-07-11 DIAGNOSIS — F329 Major depressive disorder, single episode, unspecified: Secondary | ICD-10-CM | POA: Insufficient documentation

## 2017-07-11 DIAGNOSIS — E1169 Type 2 diabetes mellitus with other specified complication: Secondary | ICD-10-CM | POA: Diagnosis present

## 2017-07-11 DIAGNOSIS — Z79899 Other long term (current) drug therapy: Secondary | ICD-10-CM | POA: Insufficient documentation

## 2017-07-11 DIAGNOSIS — Z8701 Personal history of pneumonia (recurrent): Secondary | ICD-10-CM | POA: Insufficient documentation

## 2017-07-11 DIAGNOSIS — Z6841 Body Mass Index (BMI) 40.0 and over, adult: Secondary | ICD-10-CM | POA: Insufficient documentation

## 2017-07-11 DIAGNOSIS — Z8249 Family history of ischemic heart disease and other diseases of the circulatory system: Secondary | ICD-10-CM | POA: Insufficient documentation

## 2017-07-11 DIAGNOSIS — I200 Unstable angina: Secondary | ICD-10-CM | POA: Diagnosis present

## 2017-07-11 DIAGNOSIS — E1165 Type 2 diabetes mellitus with hyperglycemia: Secondary | ICD-10-CM | POA: Insufficient documentation

## 2017-07-11 DIAGNOSIS — R0602 Shortness of breath: Secondary | ICD-10-CM | POA: Insufficient documentation

## 2017-07-11 DIAGNOSIS — E669 Obesity, unspecified: Secondary | ICD-10-CM | POA: Insufficient documentation

## 2017-07-11 DIAGNOSIS — Z794 Long term (current) use of insulin: Secondary | ICD-10-CM | POA: Insufficient documentation

## 2017-07-11 DIAGNOSIS — E1140 Type 2 diabetes mellitus with diabetic neuropathy, unspecified: Secondary | ICD-10-CM | POA: Insufficient documentation

## 2017-07-11 DIAGNOSIS — I959 Hypotension, unspecified: Secondary | ICD-10-CM | POA: Insufficient documentation

## 2017-07-11 DIAGNOSIS — Z9049 Acquired absence of other specified parts of digestive tract: Secondary | ICD-10-CM | POA: Insufficient documentation

## 2017-07-11 DIAGNOSIS — R079 Chest pain, unspecified: Secondary | ICD-10-CM | POA: Insufficient documentation

## 2017-07-11 HISTORY — DX: Sepsis, unspecified organism: A41.9

## 2017-07-11 HISTORY — DX: Polyneuropathy, unspecified: G62.9

## 2017-07-11 HISTORY — DX: Pneumonia, unspecified organism: J18.9

## 2017-07-11 LAB — BASIC METABOLIC PANEL
Anion gap: 12 (ref 5–15)
BUN: 33 mg/dL — AB (ref 6–20)
CO2: 22 mmol/L (ref 22–32)
CREATININE: 1.12 mg/dL — AB (ref 0.44–1.00)
Calcium: 9.1 mg/dL (ref 8.9–10.3)
Chloride: 106 mmol/L (ref 101–111)
GFR calc Af Amer: >60 mL/min (ref >60)
GFR, EST NON AFRICAN AMERICAN: 55 mL/min — AB (ref >60)
GLUCOSE: 261 mg/dL — AB (ref 65–99)
Potassium: 4.5 mmol/L (ref 3.5–5.1)
SODIUM: 140 mmol/L (ref 135–145)

## 2017-07-11 LAB — HEPATIC FUNCTION PANEL
ALT: 17 U/L (ref 14–54)
AST: 18 U/L (ref 15–41)
Albumin: 3.8 g/dL (ref 3.5–5.0)
Alkaline Phosphatase: 111 U/L (ref 38–126)
BILIRUBIN DIRECT: 0.1 mg/dL (ref 0.1–0.5)
BILIRUBIN INDIRECT: 0.3 mg/dL (ref 0.3–0.9)
BILIRUBIN TOTAL: 0.4 mg/dL (ref 0.3–1.2)
Total Protein: 7.2 g/dL (ref 6.5–8.1)

## 2017-07-11 LAB — CBC WITH DIFFERENTIAL/PLATELET
BASOS ABS: 0 10*3/uL (ref 0.0–0.1)
BASOS PCT: 1 %
EOS PCT: 2 %
Eosinophils Absolute: 0.2 10*3/uL (ref 0.0–0.7)
HEMATOCRIT: 41 % (ref 36.0–46.0)
Hemoglobin: 13.1 g/dL (ref 12.0–15.0)
LYMPHS PCT: 30 %
Lymphs Abs: 2.4 10*3/uL (ref 0.7–4.0)
MCH: 31.5 pg (ref 26.0–34.0)
MCHC: 32 g/dL (ref 30.0–36.0)
MCV: 98.6 fL (ref 78.0–100.0)
MONO ABS: 0.5 10*3/uL (ref 0.1–1.0)
MONOS PCT: 6 %
Neutro Abs: 5 10*3/uL (ref 1.7–7.7)
Neutrophils Relative %: 61 %
PLATELETS: 223 10*3/uL (ref 150–400)
RBC: 4.16 MIL/uL (ref 3.87–5.11)
RDW: 13.6 % (ref 11.5–15.5)
WBC: 8 10*3/uL (ref 4.0–10.5)

## 2017-07-11 LAB — BRAIN NATRIURETIC PEPTIDE: B Natriuretic Peptide: 10 pg/mL (ref 0.0–100.0)

## 2017-07-11 LAB — APTT: APTT: 27 s (ref 24–36)

## 2017-07-11 LAB — PROTIME-INR
INR: 0.97
PROTHROMBIN TIME: 12.8 s (ref 11.4–15.2)

## 2017-07-11 LAB — CBG MONITORING, ED: Glucose-Capillary: 283 mg/dL — ABNORMAL HIGH (ref 65–99)

## 2017-07-11 LAB — TROPONIN I

## 2017-07-11 LAB — TSH: TSH: 0.985 u[IU]/mL (ref 0.350–4.500)

## 2017-07-11 MED ORDER — ATORVASTATIN CALCIUM 40 MG PO TABS
40.0000 mg | ORAL_TABLET | Freq: Every day | ORAL | Status: DC
  Administered 2017-07-11: 40 mg via ORAL
  Filled 2017-07-11 (×2): qty 1

## 2017-07-11 MED ORDER — METOPROLOL TARTRATE 25 MG PO TABS
25.0000 mg | ORAL_TABLET | Freq: Two times a day (BID) | ORAL | Status: DC
  Administered 2017-07-11 – 2017-07-12 (×2): 25 mg via ORAL
  Filled 2017-07-11 (×2): qty 1

## 2017-07-11 MED ORDER — HEPARIN (PORCINE) IN NACL 100-0.45 UNIT/ML-% IJ SOLN
12.0000 [IU]/kg/h | INTRAMUSCULAR | Status: DC
  Administered 2017-07-11: 12 [IU]/kg/h via INTRAVENOUS
  Filled 2017-07-11: qty 250

## 2017-07-11 MED ORDER — NITROGLYCERIN 2 % TD OINT
1.0000 [in_us] | TOPICAL_OINTMENT | Freq: Once | TRANSDERMAL | Status: AC
Start: 2017-07-11 — End: 2017-07-11
  Administered 2017-07-11: 1 [in_us] via TOPICAL
  Filled 2017-07-11: qty 1

## 2017-07-11 MED ORDER — HEPARIN BOLUS VIA INFUSION
4000.0000 [IU] | Freq: Once | INTRAVENOUS | Status: AC
  Administered 2017-07-11: 4000 [IU] via INTRAVENOUS

## 2017-07-11 MED ORDER — ASPIRIN 325 MG PO TABS
325.0000 mg | ORAL_TABLET | Freq: Once | ORAL | Status: AC
  Administered 2017-07-11: 325 mg via ORAL
  Filled 2017-07-11: qty 1

## 2017-07-11 NOTE — ED Triage Notes (Signed)
Pt c/o sob x 1 week. Chest pressure to central chest started today. Belching a lot. Denies n/v/d. Denies sweating. Pt is non diahoretic at this time. No sob noted.

## 2017-07-11 NOTE — ED Provider Notes (Signed)
[facility] EMERGENCY DEPARTMENT Provider Note   CSN: [id number] Arrival date & time: 07/11/17  1700     History   Chief Complaint Chief Complaint  Patient presents with  . Chest Pain  . Shortness of Breath    HPI [name] is a 56 y.o. female.  Patient complains of shortness of breath on exertion and chest discomfort.  The pain is been going on for about a day and the shortness of breath has been going on for couple weeks   The history is provided by the patient.  Chest Pain   This is a new problem. The current episode started 1 to 2 hours ago. The problem occurs constantly. The problem has not changed since onset.The pain is associated with exertion. The pain is present in the substernal region. The pain is at a severity of 5/10. The pain is moderate. The quality of the pain is described as dull. The pain does not radiate. Associated symptoms include shortness of breath. Pertinent negatives include no abdominal pain, no back pain, no cough and no headaches.  Pertinent negatives for past medical history include no seizures.  Shortness of Breath  Associated symptoms include chest pain. Pertinent negatives include no headaches, no cough, no abdominal pain and no rash.    Past Medical History:  Diagnosis Date  . Depression   . Diabetes mellitus   . Hypertension   . Pneumonia   . Sepsis [facility])     Patient Active Problem List   Diagnosis Date Noted  . Depression   . DIZZINESS 04/25/2010  . CHEST PAIN, ATYPICAL 04/25/2010  . ABDOMINAL PAIN 12/03/2009  . PALPITATIONS 11/15/2009  . OBESITY 10/07/2009  . TROCHANTERIC BURSITIS, LEFT 10/07/2009  . DIABETES MELLITUS, TYPE II 09/30/2009  . DEPRESSION 09/30/2009  . MIGRAINE HEADACHE 09/30/2009  . HYPERTENSION 09/30/2009  . GERD 04/15/2009  . DIARRHEA, CHRONIC 04/15/2009    Past Surgical History:  Procedure Laterality Date  . ABDOMINAL HYSTERECTOMY    . APPENDECTOMY    . CESAREAN SECTION    . CHOLECYSTECTOMY       OB History    Gravida Para Term Preterm AB Living   5 3 3   2      SAB TAB Ectopic Multiple Live Births                   Home Medications    Prior to Admission medications   Medication Sig Start Date End Date Taking? Authorizing Provider  glimepiride (AMARYL) 1 MG tablet Take by mouth daily before breakfast.      [provider]  insulin glargine (LANTUS) 100 UNIT/ML injection Inject 20 Units into the skin every morning.      [provider]  propranolol (INDERAL) 10 MG tablet Take 10 mg by mouth daily.      [provider]  venlafaxine (EFFEXOR-XR) 150 MG 24 hr capsule Take 150 mg by mouth daily.      [provider]    Family History Family History  Problem Relation Age of Onset  . Diabetes Mother   . Heart failure Mother   . Hypertension Mother   . Cancer Mother   . Diabetes Father     Social History Social History   Tobacco Use  . Smoking status: Never Smoker  . Smokeless tobacco: Never Used  Substance Use Topics  . Alcohol use: No  . Drug use: No     Allergies   Codeine and Ibuprofen  Review of Systems Review of Systems  Constitutional: Negative for appetite change and fatigue.  HENT: Negative for congestion, ear discharge and sinus pressure.   Eyes: Negative for discharge.  Respiratory: Positive for shortness of breath. Negative for cough.   Cardiovascular: Positive for chest pain.  Gastrointestinal: Negative for abdominal pain and diarrhea.  Genitourinary: Negative for frequency and hematuria.  Musculoskeletal: Negative for back pain.  Skin: Negative for rash.  Neurological: Negative for seizures and headaches.  Psychiatric/Behavioral: Negative for hallucinations.     Physical Exam Updated Vital Signs BP 139/71 (BP Location: Right Arm)   Pulse (!) 109   Temp 98.3 F (36.8 C) (Oral)   Resp 18   Ht 5\' 4"  (1.626 m)   Wt 117.9 kg (260 lb)   SpO2 99%   BMI 44.63 kg/m   Physical Exam    Constitutional: She is oriented to person, place, and time. She appears well-developed.  HENT:  Head: Normocephalic.  Eyes: Conjunctivae and EOM are normal. No scleral icterus.  Neck: Neck supple. No thyromegaly present.  Cardiovascular: Normal rate and regular rhythm. Exam reveals no gallop and no friction rub.  No murmur heard. Pulmonary/Chest: No stridor. She has no wheezes. She has no rales. She exhibits no tenderness.  Abdominal: She exhibits no distension. There is no tenderness. There is no rebound.  Musculoskeletal: Normal range of motion. She exhibits no edema.  Lymphadenopathy:    She has no cervical adenopathy.  Neurological: She is oriented to person, place, and time. She exhibits normal muscle tone. Coordination normal.  Skin: No rash noted. No erythema.  Psychiatric: She has a normal mood and affect. Her behavior is normal.     ED Treatments / Results  Labs (all labs ordered are listed, but only abnormal results are displayed) Labs Reviewed  BASIC METABOLIC PANEL - Abnormal; Notable for the following components:      Result Value   Glucose, Bld 261 (*)    BUN 33 (*)    Creatinine, Ser 1.12 (*)    GFR calc non Af Amer 55 (*)    All other components within normal limits  CBG MONITORING, ED - Abnormal; Notable for the following components:   Glucose-Capillary 283 (*)    All other components within normal limits  TROPONIN I  CBC WITH DIFFERENTIAL/PLATELET  BRAIN NATRIURETIC PEPTIDE  HEPATIC FUNCTION PANEL    EKG  EKG Interpretation  Date/Time:  Wednesday July 11 2017 17:12:50 EST Ventricular Rate:  111 PR Interval:  168 QRS Duration: 82 QT Interval:  334 QTC Calculation: 454 R Axis:   -21 Text Interpretation:  Sinus tachycardia Possible Anterolateral infarct , age undetermined Abnormal ECG Confirmed by Milton Ferguson [phone]) on 07/11/2017 8:00:47 PM Also confirmed by Milton Ferguson [phone])  on 07/11/2017 9:04:45 PM       Radiology Dg Chest 2  View  Result Date: 07/11/2017 CLINICAL DATA:  Chest pain and shortness of breath beginning at noon today. History of sepsis and pneumonia. EXAM: CHEST  2 VIEW COMPARISON:  Chest radiograph January 29, 2008 FINDINGS: Cardiomediastinal silhouette is unremarkable for this low inspiratory examination with crowded vasculature markings. The lungs are clear without pleural effusions or focal consolidations. Trace LEFT lung base atelectasis. Similarly elevated RIGHT hemidiaphragm. Trachea projects midline and there is no pneumothorax. Included soft tissue planes and osseous structures are non-suspicious. Surgical clips in the included right abdomen compatible with cholecystectomy. Large body habitus. IMPRESSION: No acute cardiopulmonary process for this low inspiratory examination. Electronically Signed  By: Elon Alas M.D.   On: 07/11/2017 17:45    Procedures Procedures (including critical care time)  Medications Ordered in ED Medications  nitroGLYCERIN (NITROGLYN) 2 % ointment 1 inch (not administered)  aspirin tablet 325 mg (not administered)     Initial Impression / Assessment and Plan / ED Course  I have reviewed the triage vital signs and the nursing notes.  Pertinent labs & imaging results that were available during my care of the patient were reviewed by me and considered in my medical decision making (see chart for details).     Patient with dyspnea on exertion and chest pain.  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  Suspect patient is having angina symptoms.  She will be admitted to medicine for further workup  Final Clinical Impressions(s) / ED Diagnoses   Final diagnoses:  Other chest pain    ED Discharge Orders    None       Milton Ferguson, MD 07/11/17 2122

## 2017-07-11 NOTE — ED Notes (Signed)
AC notified for need of Lipitor medication

## 2017-07-11 NOTE — H&P (Signed)
History and Physical    [name] HQI:[id number] DOB: 14-Mar-1961 DOA: 07/11/2017  PCP:   [facility] in [place], [place] Consultants:  None Patient coming from: Home - lives with roommate; visiting her daughter in [place] since Nov 22, staying through at least Christmas; [place]: Daughter, [phone]  Chief Complaint: chest pain  HPI: [name] is a 56 y.o. female with medical history significant of DM, HTN, neuropathy, and severe depression presenting with exertional, substernal chest pain.  For about a week or two, she has noticed progressive SOB.  Sweeping the floor and she can barely breathe.  Today, she developed chest pain about lunch time.  Substernal.  She was shopping at [facility] when it started.  She wasn't really exerting herself.  Pain lasted about 10-15 minutes, resolved with rest and getting her breath back.  The pain recurred when she was walking down steps to come to the car.  It lasted maybe 5 minutes and resolved spontaneously.  She continues to feel discomfort intermittently in her mid-chest, sometimes stronger than others.  Worse with any exertion.  Better with rest.  +nausea.  No diaphoresis.  She has never had a stress test.  Her mother died of CAD in her early 64s and most of her 41 aunts and uncles died of MIs.    ED Course: Visiting from [place].  Progressive SOB with new CP today.  Strong FH.  No h/o prior evaluation.  Negative troponin and EKG.  Review of Systems: As per HPI; otherwise review of systems reviewed and negative.   Ambulatory Status:   Ambulates without assistance  Past Medical History:  Diagnosis Date  . Depression   . Diabetes mellitus   . Hypertension   . Neuropathy   . Pneumonia   . Sepsis [facility])     Past Surgical History:  Procedure Laterality Date  . ABDOMINAL HYSTERECTOMY    . APPENDECTOMY    . CESAREAN SECTION    . CHOLECYSTECTOMY      Social History   Socioeconomic History  . Marital status: Married    Spouse name:  Not on file  . Number of children: Not on file  . Years of education: Not on file  . Highest education level: Not on file  Social Needs  . Financial resource strain: Not on file  . Food insecurity - worry: Not on file  . Food insecurity - inability: Not on file  . Transportation needs - medical: Not on file  . Transportation needs - non-medical: Not on file  Occupational History  . Occupation: Disability  Tobacco Use  . Smoking status: Never Smoker  . Smokeless tobacco: Never Used  Substance and Sexual Activity  . Alcohol use: No  . Drug use: No  . Sexual activity: Yes    Birth control/protection: None  Other Topics Concern  . Not on file  Social History Narrative  . Not on file    Allergies  Allergen Reactions  . Ibuprofen Swelling  . Codeine Palpitations    Family History  Problem Relation Age of Onset  . Diabetes Mother   . Heart failure Mother   . Hypertension Mother   . Cancer Mother   . CAD Mother 35  . Diabetes Father     Prior to Admission medications   Medication Sig Start Date End Date Taking? Authorizing Provider  glimepiride (AMARYL) 1 MG tablet Take by mouth daily before breakfast.      [provider]  insulin glargine (LANTUS)  100 UNIT/ML injection Inject 20 Units into the skin every morning.      [provider]  propranolol (INDERAL) 10 MG tablet Take 10 mg by mouth daily.      [provider]  venlafaxine (EFFEXOR-XR) 150 MG 24 hr capsule Take 150 mg by mouth daily.      [provider]    Physical Exam: Vitals:   07/11/17 1715 07/11/17 2125 07/11/17 2200  BP: 139/71 (!) 110/48 (!) 144/69  Pulse: (!) 109 85 86  Resp: 18 20 18   Temp: 98.3 F (36.8 C)    TempSrc: Oral    SpO2: 99% 97% 99%  Weight: 117.9 kg (260 lb)    Height: 5\' 4"  (1.626 m)       General:  Appears calm and comfortable and is NAD Eyes:  PERRL, EOMI, normal lids, iris ENT:  grossly normal hearing, lips & tongue, mmm Neck:  no LAD,  masses or thyromegaly Cardiovascular:  RRR, no m/r/g. No LE edema.  Respiratory:   CTA bilaterally with no wheezes/rales/rhonchi.  Normal respiratory effort. Abdomen:  soft, NT, ND, NABS Skin:  no rash or induration seen on limited exam Musculoskeletal:  grossly normal tone BUE/BLE, good ROM, no bony abnormality Lower extremity:  No LE edema.  Limited foot exam with no ulcerations.  2+ distal pulses. Psychiatric:  grossly normal mood and affect, speech fluent and appropriate, AOx3 Neurologic:  CN 2-12 grossly intact, moves all extremities in coordinated fashion, sensation intact    Radiological Exams on Admission: Dg Chest 2 View  Result Date: 07/11/2017 CLINICAL DATA:  Chest pain and shortness of breath beginning at noon today. History of sepsis and pneumonia. EXAM: CHEST  2 VIEW COMPARISON:  Chest radiograph January 29, 2008 FINDINGS: Cardiomediastinal silhouette is unremarkable for this low inspiratory examination with crowded vasculature markings. The lungs are clear without pleural effusions or focal consolidations. Trace LEFT lung base atelectasis. Similarly elevated RIGHT hemidiaphragm. Trachea projects midline and there is no pneumothorax. Included soft tissue planes and osseous structures are non-suspicious. Surgical clips in the included right abdomen compatible with cholecystectomy. Large body habitus. IMPRESSION: No acute cardiopulmonary process for this low inspiratory examination. Electronically Signed   By: Elon Alas M.D.   On: 07/11/2017 17:45    EKG: Independently reviewed.  Sinus tachycardia with rate 111; nonspecific ST changes with no evidence of acute ischemia   Labs on Admission: I have personally reviewed the available labs and imaging studies at the time of the admission.  Pertinent labs:   Glucose 261 BUN 33/Creatinine 1.12/GFR 55 BNP 10 Troponin <0.03 Normal CBC   Assessment/Plan Principal Problem:   Unstable angina (HCC) Active Problems:   Diabetes  mellitus type 2 in obese ([place])   OBESITY   Essential hypertension    Unstable angina -Patient with progressive SOB for 1-2 weeks and substernal chest pain that came on acutely today with exertional, relieved with rest. -3/3 typical symptoms suggestive of typical cardiac chest pain.  -Given the ongoing presence of pain that is worse with any exertion, I am concerned about unstable angina and so will plan to start heparin drip and transfer to [facility] SDU for admission. -CXR unremarkable.   -Initial cardiac enzymes negative.   -EKG with nonspecific ST changes of uncertain duration, no apparent STEMI. -TIMI risk score is 2; which predicts a 14 days risk of death, recurrent MI, or urgent revascularization of 8.3%.  -Will plan to admit to SDU at [facility] on telemetry to further evaluate for  ACS by overnight observation.  -Inbox message sent to cardsmaster for AM evaluation by cardiology given negative initial troponin; if repeat troponin comes back positive, more urgent overnight cardiology consultation will be required. -cycle troponin q6h x 3 and repeat EKG in AM -ASA 81 mg PO daily -morphine given -Start Lipitor 40 mg qhs for now -Risk factor stratification with FLP and HgbA1c; will also check TSH and UDS -Start metoprolol 25 mg BID  DM -Will check A1c -Continue Levemir -Cover with moderate-scale SSI  HTN -Hold lisinopril-HCTZ -Start metoprolol   DVT prophylaxis: Heparin drip Code Status:  Full - confirmed with patient/family Family Communication: Daughter present throughout evaluation  Disposition Plan:  Home once clinically improved Consults called: Cardiology via [facility] - sooner if troponin increases overnight  Admission status: It is my clinical opinion that referral for OBSERVATION is reasonable and necessary in this patient based on the above information provided. The aforementioned taken together are felt to place the patient at high risk for further clinical deterioration.  However it is anticipated that the patient may be medically stable for discharge from the hospital within 24 to 48 hours.     Karmen Bongo MD [facility]  If note is complete, please contact covering daytime or nighttime physician. [email or web address] Password [email or web address]  07/11/2017, 10:17 PM

## 2017-07-11 NOTE — ED Notes (Signed)
ekg given to edp 

## 2017-07-12 ENCOUNTER — Encounter (HOSPITAL_COMMUNITY): Payer: Self-pay | Admitting: Adult Health

## 2017-07-12 ENCOUNTER — Other Ambulatory Visit (HOSPITAL_COMMUNITY): Payer: Self-pay

## 2017-07-12 ENCOUNTER — Encounter (HOSPITAL_COMMUNITY)
Admission: EM | Disposition: A | Discharge status: Home / Self Care | Payer: Self-pay | Source: Home / Self Care | Attending: Cardiology

## 2017-07-12 DIAGNOSIS — I10 Essential (primary) hypertension: Secondary | ICD-10-CM

## 2017-07-12 DIAGNOSIS — I200 Unstable angina: Secondary | ICD-10-CM

## 2017-07-12 DIAGNOSIS — E669 Obesity, unspecified: Secondary | ICD-10-CM

## 2017-07-12 DIAGNOSIS — R0789 Other chest pain: Secondary | ICD-10-CM | POA: Diagnosis present

## 2017-07-12 DIAGNOSIS — E1169 Type 2 diabetes mellitus with other specified complication: Secondary | ICD-10-CM

## 2017-07-12 HISTORY — PX: LEFT HEART CATH AND CORONARY ANGIOGRAPHY: CATH118249

## 2017-07-12 LAB — LIPID PANEL
Cholesterol: 184 mg/dL (ref 0–200)
HDL: 36 mg/dL — AB (ref >40)
LDL Cholesterol: UNDETERMINED mg/dL (ref 0–99)
TRIGLYCERIDES: 426 mg/dL — AB (ref <150)
Total CHOL/HDL Ratio: 5.1 RATIO
VLDL: UNDETERMINED mg/dL (ref 0–40)

## 2017-07-12 LAB — CBC
HCT: 37.8 % (ref 36.0–46.0)
Hemoglobin: 12.1 g/dL (ref 12.0–15.0)
MCH: 31.5 pg (ref 26.0–34.0)
MCHC: 32 g/dL (ref 30.0–36.0)
MCV: 98.4 fL (ref 78.0–100.0)
Platelets: 211 10*3/uL (ref 150–400)
RBC: 3.84 MIL/uL — AB (ref 3.87–5.11)
RDW: 13.7 % (ref 11.5–15.5)
WBC: 7.2 10*3/uL (ref 4.0–10.5)

## 2017-07-12 LAB — HEMOGLOBIN A1C
Hgb A1c MFr Bld: 8.3 % — ABNORMAL HIGH (ref 4.8–5.6)
Mean Plasma Glucose: 191.51 mg/dL

## 2017-07-12 LAB — TROPONIN I

## 2017-07-12 LAB — RAPID URINE DRUG SCREEN, HOSP PERFORMED
Amphetamines: NOT DETECTED
Barbiturates: NOT DETECTED
Benzodiazepines: NOT DETECTED
COCAINE: NOT DETECTED
OPIATES: NOT DETECTED
Tetrahydrocannabinol: NOT DETECTED

## 2017-07-12 LAB — GLUCOSE, CAPILLARY: GLUCOSE-CAPILLARY: 219 mg/dL — AB (ref 65–99)

## 2017-07-12 LAB — CBG MONITORING, ED
GLUCOSE-CAPILLARY: 210 mg/dL — AB (ref 65–99)
GLUCOSE-CAPILLARY: 225 mg/dL — AB (ref 65–99)

## 2017-07-12 LAB — HEPARIN LEVEL (UNFRACTIONATED): Heparin Unfractionated: 0.18 IU/mL — ABNORMAL LOW (ref 0.30–0.70)

## 2017-07-12 LAB — MAGNESIUM: MAGNESIUM: 1.8 mg/dL (ref 1.7–2.4)

## 2017-07-12 SURGERY — LEFT HEART CATH AND CORONARY ANGIOGRAPHY
Anesthesia: LOCAL

## 2017-07-12 MED ORDER — ACETAMINOPHEN 325 MG PO TABS: 650.0000 mg | ORAL_TABLET | ORAL | Status: DC | PRN

## 2017-07-12 MED ORDER — ATORVASTATIN CALCIUM 80 MG PO TABS: 80.0000 mg | ORAL_TABLET | Freq: Every day | ORAL | Status: DC

## 2017-07-12 MED ORDER — LIDOCAINE HCL (PF) 1 % IJ SOLN
INTRAMUSCULAR | Status: AC
  Filled 2017-07-12: qty 30

## 2017-07-12 MED ORDER — SODIUM CHLORIDE 0.9% FLUSH: 3.0000 mL | INTRAVENOUS | Status: DC | PRN

## 2017-07-12 MED ORDER — PANTOPRAZOLE SODIUM 40 MG PO TBEC
40.0000 mg | DELAYED_RELEASE_TABLET | Freq: Every day | ORAL | Status: DC
  Administered 2017-07-12: 40 mg via ORAL
  Filled 2017-07-12: qty 1

## 2017-07-12 MED ORDER — GI COCKTAIL ~~LOC~~: 30.0000 mL | Freq: Four times a day (QID) | ORAL | Status: DC | PRN

## 2017-07-12 MED ORDER — HEPARIN (PORCINE) IN NACL 2-0.9 UNIT/ML-% IJ SOLN
INTRAMUSCULAR | Status: AC
  Filled 2017-07-12: qty 1000

## 2017-07-12 MED ORDER — ATORVASTATIN CALCIUM 40 MG PO TABS: 40.0000 mg | ORAL_TABLET | Freq: Every day | ORAL | 2 refills | Status: DC

## 2017-07-12 MED ORDER — HEPARIN SODIUM (PORCINE) 1000 UNIT/ML IJ SOLN
INTRAMUSCULAR | Status: DC | PRN
  Administered 2017-07-12: 6000 [IU] via INTRAVENOUS

## 2017-07-12 MED ORDER — ONDANSETRON HCL 4 MG/2ML IJ SOLN: 4.0000 mg | Freq: Four times a day (QID) | INTRAMUSCULAR | Status: DC | PRN

## 2017-07-12 MED ORDER — GABAPENTIN 300 MG PO CAPS
600.0000 mg | ORAL_CAPSULE | Freq: Three times a day (TID) | ORAL | Status: DC
  Administered 2017-07-12: 600 mg via ORAL
  Filled 2017-07-12: qty 2

## 2017-07-12 MED ORDER — SODIUM CHLORIDE 0.9 % IV SOLN: INTRAVENOUS | Status: DC

## 2017-07-12 MED ORDER — NITROGLYCERIN 1 MG/10 ML FOR IR/CATH LAB
INTRA_ARTERIAL | Status: AC
  Filled 2017-07-12: qty 10

## 2017-07-12 MED ORDER — SODIUM CHLORIDE 0.9 % IV SOLN: 250.0000 mL | INTRAVENOUS | Status: DC | PRN

## 2017-07-12 MED ORDER — HEPARIN (PORCINE) IN NACL 2-0.9 UNIT/ML-% IJ SOLN
INTRAMUSCULAR | Status: AC | PRN
  Administered 2017-07-12: 1000 mL

## 2017-07-12 MED ORDER — SODIUM CHLORIDE 0.45 % IV BOLUS
1000.0000 mL | Freq: Once | INTRAVENOUS | Status: AC
  Administered 2017-07-12: 1000 mL via INTRAVENOUS

## 2017-07-12 MED ORDER — INSULIN DETEMIR 100 UNIT/ML ~~LOC~~ SOLN
60.0000 [IU] | Freq: Every morning | SUBCUTANEOUS | Status: DC
  Filled 2017-07-12 (×2): qty 0.6

## 2017-07-12 MED ORDER — DULOXETINE HCL 30 MG PO CPEP
60.0000 mg | ORAL_CAPSULE | Freq: Every day | ORAL | Status: DC
  Administered 2017-07-12: 60 mg via ORAL
  Filled 2017-07-12: qty 2

## 2017-07-12 MED ORDER — HEPARIN SODIUM (PORCINE) 1000 UNIT/ML IJ SOLN
INTRAMUSCULAR | Status: AC
  Filled 2017-07-12: qty 1

## 2017-07-12 MED ORDER — INSULIN ASPART 100 UNIT/ML ~~LOC~~ SOLN
0.0000 [IU] | Freq: Every day | SUBCUTANEOUS | Status: DC
  Administered 2017-07-12: 2 [IU] via SUBCUTANEOUS
  Filled 2017-07-12: qty 1

## 2017-07-12 MED ORDER — MORPHINE SULFATE (PF) 2 MG/ML IV SOLN: 2.0000 mg | INTRAVENOUS | Status: DC | PRN

## 2017-07-12 MED ORDER — VERAPAMIL HCL 2.5 MG/ML IV SOLN
INTRA_ARTERIAL | Status: DC | PRN
  Administered 2017-07-12: 10 mL via INTRA_ARTERIAL

## 2017-07-12 MED ORDER — CARBAMAZEPINE ER 100 MG PO TB12
200.0000 mg | ORAL_TABLET | Freq: Two times a day (BID) | ORAL | Status: DC
  Administered 2017-07-12: 200 mg via ORAL
  Filled 2017-07-12 (×3): qty 1

## 2017-07-12 MED ORDER — HEPARIN (PORCINE) IN NACL 100-0.45 UNIT/ML-% IJ SOLN: 1250.0000 [IU]/h | INTRAMUSCULAR | Status: DC

## 2017-07-12 MED ORDER — ASPIRIN EC 81 MG PO TBEC
81.0000 mg | DELAYED_RELEASE_TABLET | Freq: Every day | ORAL | Status: DC
  Administered 2017-07-12: 81 mg via ORAL
  Filled 2017-07-12: qty 1

## 2017-07-12 MED ORDER — INSULIN DETEMIR 100 UNIT/ML ~~LOC~~ SOLN
63.0000 [IU] | Freq: Every morning | SUBCUTANEOUS | Status: DC
  Administered 2017-07-12: 63 [IU] via SUBCUTANEOUS
  Filled 2017-07-12 (×2): qty 0.63

## 2017-07-12 MED ORDER — ONDANSETRON HCL 4 MG/2ML IJ SOLN
4.0000 mg | Freq: Four times a day (QID) | INTRAMUSCULAR | Status: DC | PRN
Start: 2017-07-12 — End: 2017-07-12

## 2017-07-12 MED ORDER — SODIUM CHLORIDE 0.9% FLUSH: 3.0000 mL | Freq: Two times a day (BID) | INTRAVENOUS | Status: DC

## 2017-07-12 MED ORDER — VERAPAMIL HCL 2.5 MG/ML IV SOLN
INTRAVENOUS | Status: AC
  Filled 2017-07-12: qty 2

## 2017-07-12 MED ORDER — INSULIN ASPART 100 UNIT/ML ~~LOC~~ SOLN
0.0000 [IU] | Freq: Three times a day (TID) | SUBCUTANEOUS | Status: DC
  Administered 2017-07-12: 5 [IU] via SUBCUTANEOUS
  Filled 2017-07-12: qty 1

## 2017-07-12 MED ORDER — LIDOCAINE HCL (PF) 1 % IJ SOLN
INTRAMUSCULAR | Status: DC | PRN
  Administered 2017-07-12: 2 mL via INTRADERMAL

## 2017-07-12 MED ORDER — IOPAMIDOL (ISOVUE-370) INJECTION 76%
INTRAVENOUS | Status: DC | PRN
  Administered 2017-07-12: 70 mL via INTRA_ARTERIAL

## 2017-07-12 SURGICAL SUPPLY — 12 items

## 2017-07-12 NOTE — ED Notes (Signed)
Pt given pack of graham crackers and 2 peanut butter packs with ice water.

## 2017-07-12 NOTE — Interval H&P Note (Signed)
Cath Lab Visit (complete for each Cath Lab visit)  Clinical Evaluation Leading to the Procedure:   ACS: Yes.    Non-ACS:    Anginal Classification: CCS III  Anti-ischemic medical therapy: No Therapy  Non-Invasive Test Results: No non-invasive testing performed  Prior CABG: No previous CABG      History and Physical Interval Note:  07/12/2017 1:27 PM  [name]  has presented today for surgery, with the diagnosis of [place]  The various methods of treatment have been discussed with the patient and family. After consideration of risks, benefits and other options for treatment, the patient has consented to  Procedure(s): LEFT HEART CATH AND CORONARY ANGIOGRAPHY (N/A) as a surgical intervention .  The patient's history has been reviewed, patient examined, no change in status, stable for surgery.  I have reviewed the patient's chart and labs.  Questions were answered to the patient's satisfaction.     Quay Burow

## 2017-07-12 NOTE — Progress Notes (Signed)
ANTICOAGULATION CONSULT NOTE - Preliminary  Pharmacy Consult for Heparin Indication: chest pain/ACS  Allergies  Allergen Reactions  . Ibuprofen Swelling  . Codeine Palpitations    Patient Measurements: Height: 5\' 4"  (162.6 cm) Weight: 260 lb (117.9 kg) IBW/kg (Calculated) : 54.7 kg  HEPARIN DW (KG): 83.2 kg   Vital Signs: Temp: 98.3 F (36.8 C) (12/19 1715) Temp Source: Oral (12/19 1715) BP: 111/55 (12/20 0000) Pulse Rate: 82 (12/20 0000)  Labs: Recent Labs    07/11/17 1959  HGB 13.1  HCT 41.0  PLT 223  APTT 27  LABPROT 12.8  INR 0.97  CREATININE 1.12*  TROPONINI <0.03   Estimated Creatinine Clearance: 72.5 mL/min (A) (by C-G formula based on SCr of 1.12 mg/dL (H)).  Medical History: Past Medical History:  Diagnosis Date  . Depression   . Diabetes mellitus   . Hypertension   . Neuropathy   . Pneumonia   . Sepsis ([place])     Medications:   (Not in a hospital admission) Scheduled:  . atorvastatin  40 mg Oral q1800  . metoprolol tartrate  25 mg Oral BID    Assessment: 56 yr old female with chest pain and progressive SOB for 1-2 weeks.  Initial cardiac enzymes are negative.  Goal of Therapy:  Heparin level 0.3-0.7 units/ml   Plan:  Heparin 4000 unit IV bolus Heparin drip at 1000 unit/hr Heparin level 6 hours after initiation and daily CBC daily  Preliminary review of pertinent patient information completed.  Forestine Na clinical pharmacist will complete review during morning rounds to assess the patient and finalize treatment regimen.  Beryle Lathe, [facility] 07/12/2017,12:20 AM
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
## 2017-07-12 NOTE — Progress Notes (Signed)
ANTICOAGULATION CONSULT NOTE - Follow Up Consult  Pharmacy Consult for heparin Indication: chest pain/ACS  Allergies  Allergen Reactions  . Ibuprofen Swelling  . Codeine Palpitations    Patient Measurements: Height: 5\' 4"  (162.6 cm) Weight: 260 lb (117.9 kg) IBW/kg (Calculated) : 54.7 Heparin Dosing Weight: 84 kg  Vital Signs: BP: 129/60 (12/20 0600) Pulse Rate: 76 (12/20 0600)  Labs: Recent Labs    07/11/17 1959 07/12/17 0057 07/12/17 0603  HGB 13.1  --  12.1  HCT 41.0  --  37.8  PLT 223  --  211  APTT 27  --   --   LABPROT 12.8  --   --   INR 0.97  --   --   HEPARINUNFRC  --   --  0.18*  CREATININE 1.12*  --   --   TROPONINI <0.03 <0.03 <0.03    Estimated Creatinine Clearance: 72.5 mL/min (A) (by C-G formula based on SCr of 1.12 mg/dL (H)).   Medications:   (Not in a hospital admission) Scheduled:  . aspirin EC  81 mg Oral Daily  . atorvastatin  40 mg Oral q1800  . carbamazepine  200 mg Oral BID  . DULoxetine  60 mg Oral Daily  . gabapentin  600 mg Oral TID  . insulin aspart  0-15 Units Subcutaneous TID WC  . insulin aspart  0-5 Units Subcutaneous QHS  . insulin detemir  60 Units Subcutaneous q morning - 10a  . metoprolol tartrate  25 mg Oral BID  . pantoprazole  40 mg Oral Daily   Infusions:  . heparin      Assessment: 6 hour heparin level was subtherapeutic. CBC stable.  Goal of Therapy:  Heparin level 0.3-0.7 units/ml Monitor platelets by anticoagulation protocol: Yes   Plan:  Increase heparin drip to 1250 units/hr Recheck heparin level in 6 hours.   Beryle Lathe 07/12/2017,7:04 AM

## 2017-07-12 NOTE — H&P (View-Only) (Signed)
Patient seen and examined by Dr. Domenic Polite. (please see his note) He recommends cardiac cath. Spoke with cath lab and they are available to have cath completed today. Cath orders are written and she will be transferred straight to cath lab. More recommendation post procedure.   I have spoke to the patient about the procedure and she is willing to be transferred and have cath.  The patient understands that risks include but are not limited to stroke (1 in 1000), death (1 in 73), kidney failure [usually temporary] (1 in 500), bleeding (1 in 200), allergic reaction [possibly serious] (1 in 200), and agrees to proceed.

## 2017-07-12 NOTE — Progress Notes (Signed)
PROGRESS NOTE    [name]  OEH:[id number] DOB: [birth year] DOA: 07/11/2017 PCP: Patient, No Pcp Per     Brief Narrative:  56 year old woman admitted from home on 12/19 due to chest pain.  She has a personal history of diabetes, hypertension and severe depression.  She presented with exertional, substernal chest pain and progressive shortness of breath.  She has strong family history of coronary artery disease with the 52 of her 50 aunts and uncles having had MIs.  Patient herself has never been diagnosed with coronary artery disease.  Admission has been requested.   Assessment & Plan:   Principal Problem:   Unstable angina (HCC) Active Problems:   Diabetes mellitus type 2 in obese ([place])   OBESITY   Essential hypertension   Chest pain/unstable angina -Due to progression of symptoms she has been labeled as unstable angina and has been started on a heparin drip in the emergency department. -She is currently awaiting transfer to [facility], however no beds are available. -Due to uncertain timing of transfer, will request cardiology see her while still at [facility] for further recommendations. -She has now had 3 negative troponins. -EKG from this morning shows sinus rhythm without acute ischemic changes. -She is currently chest pain-free, remains on heparin drip. -Continue aspirin, statin, beta-blocker. -She did have an episode of hypotension overnight which resolved with IV fluid bolus. -Check lipids.  Hypertension -Holding lisinopril/HCTZ. -Was started on metoprolol yesterday and was noticed to have some hypotension after initial dosing that resolved with IV fluid bolus. -For now we will elect to continue beta-blocker given possibility of ACS.  Diabetes -CBGs remain elevated in the 210 -283 range. -Increase Levemir to 63 units, continue SSI.   DVT prophylaxis: Currently on a heparin drip Code Status: Full code Family Communication: Patient  only Disposition Plan: Plan currently is to transfer to [facility] once bed becomes available  Consultants:   Cardiology pending  Procedures:   None  Antimicrobials:  Anti-infectives (From admission, onward)   None       Subjective: Currently lying in bed, denies current chest pain or shortness of breath.  Objective: Vitals:   07/12/17 0530 07/12/17 0600 07/12/17 0630 07/12/17 0700  BP: 114/70 129/60 113/76 116/70  Pulse: 64 76 67 67  Resp: 17 20 20 18   Temp:      TempSrc:      SpO2: 97% 97% 95% 98%  Weight:      Height:        Intake/Output Summary (Last 24 hours) at 07/12/2017 0924 Last data filed at 07/12/2017 0507 Gross per 24 hour  Intake 1000 ml  Output -  Net 1000 ml   Filed Weights   07/11/17 1715  Weight: 117.9 kg (260 lb)    Examination:  General exam: Alert, awake, oriented x 3, obese Respiratory system: Clear to auscultation. Respiratory effort normal. Cardiovascular system:RRR. No murmurs, rubs, gallops. Gastrointestinal system: Abdomen is nondistended, soft and nontender. No organomegaly or masses felt. Normal bowel sounds heard. Central nervous system: Alert and oriented. No focal neurological deficits. Extremities: No C/C/E, +pedal pulses Skin: No rashes, lesions or ulcers Psychiatry: Judgement and insight appear normal. Mood & affect appropriate.     Data Reviewed: I have personally reviewed following labs and imaging studies  CBC: Recent Labs  Lab 07/11/17 1959 07/12/17 0603  WBC 8.0 7.2  NEUTROABS 5.0  --   HGB 13.1 12.1  HCT 41.0 37.8  MCV 98.6 98.4  PLT 223 211  Basic Metabolic Panel: Recent Labs  Lab 07/11/17 1959 07/12/17 0057  NA 140  --   K 4.5  --   CL 106  --   CO2 22  --   GLUCOSE 261*  --   BUN 33*  --   CREATININE 1.12*  --   CALCIUM 9.1  --   MG  --  1.8   GFR: Estimated Creatinine Clearance: 72.5 mL/min (A) (by C-G formula based on SCr of 1.12 mg/dL (H)). Liver Function Tests: Recent Labs  Lab  07/11/17 1959  AST 18  ALT 17  ALKPHOS 111  BILITOT 0.4  PROT 7.2  ALBUMIN 3.8   No results for input(s): LIPASE, AMYLASE in the last 168 hours. No results for input(s): AMMONIA in the last 168 hours. Coagulation Profile: Recent Labs  Lab 07/11/17 1959  INR 0.97   Cardiac Enzymes: Recent Labs  Lab 07/11/17 1959 07/12/17 0057 07/12/17 0603  TROPONINI <0.03 <0.03 <0.03   BNP (last 3 results) No results for input(s): PROBNP in the last 8760 hours. HbA1C: No results for input(s): HGBA1C in the last 72 hours. CBG: Recent Labs  Lab 07/11/17 1721 07/12/17 0124 07/12/17 0920  GLUCAP 283* 210* 225*   Lipid Profile: Recent Labs    07/12/17 0603  CHOL 184  HDL 36*  LDLCALC UNABLE TO CALCULATE IF TRIGLYCERIDE OVER 400 mg/dL  TRIG 426*  CHOLHDL 5.1   Thyroid Function Tests: Recent Labs    07/11/17 1959  TSH 0.985   Anemia Panel: No results for input(s): VITAMINB12, FOLATE, FERRITIN, TIBC, IRON, RETICCTPCT in the last 72 hours. Urine analysis:    Component Value Date/Time   COLORURINE YELLOW 01/29/2011 0831   APPEARANCEUR HAZY (A) 01/29/2011 0831   LABSPEC >1.030 (H) 01/29/2011 0831   PHURINE 5.0 01/29/2011 0831   GLUCOSEU NEGATIVE 01/29/2011 0831   HGBUR LARGE (A) 01/29/2011 0831   BILIRUBINUR SMALL (A) 01/29/2011 0831   KETONESUR TRACE (A) 01/29/2011 0831   PROTEINUR 100 (A) 01/29/2011 0831   UROBILINOGEN 0.2 01/29/2011 0831   NITRITE NEGATIVE 01/29/2011 0831   LEUKOCYTESUR SMALL (A) 01/29/2011 0831   Sepsis Labs: @LABRCNTIP (procalcitonin:4,lacticidven:4)  )No results found for this or any previous visit (from the past 240 hour(s)).       Radiology Studies: Dg Chest 2 View  Result Date: 07/11/2017 CLINICAL DATA:  Chest pain and shortness of breath beginning at noon today. History of sepsis and pneumonia. EXAM: CHEST  2 VIEW COMPARISON:  Chest radiograph January 29, 2008 FINDINGS: Cardiomediastinal silhouette is unremarkable for this low inspiratory  examination with crowded vasculature markings. The lungs are clear without pleural effusions or focal consolidations. Trace LEFT lung base atelectasis. Similarly elevated RIGHT hemidiaphragm. Trachea projects midline and there is no pneumothorax. Included soft tissue planes and osseous structures are non-suspicious. Surgical clips in the included right abdomen compatible with cholecystectomy. Large body habitus. IMPRESSION: No acute cardiopulmonary process for this low inspiratory examination. Electronically Signed   By: Elon Alas M.D.   On: 07/11/2017 17:45        Scheduled Meds: . aspirin EC  81 mg Oral Daily  . atorvastatin  40 mg Oral q1800  . carbamazepine  200 mg Oral BID  . DULoxetine  60 mg Oral Daily  . gabapentin  600 mg Oral TID  . insulin aspart  0-15 Units Subcutaneous TID WC  . insulin aspart  0-5 Units Subcutaneous QHS  . insulin detemir  60 Units Subcutaneous q morning - 10a  . metoprolol tartrate  25 mg Oral BID  . pantoprazole  40 mg Oral Daily   Continuous Infusions: . heparin 1,250 Units/hr (07/12/17 0728)     LOS: 0 days    Time spent: 35 minutes. Greater than 50% of this time was spent in direct contact with the patient coordinating care.     Lelon Frohlich, MD [facility] Pager [phone]  If 7PM-7AM, please contact night-coverage [email or web address] Password [email or web address] 07/12/2017, 9:24 AM

## 2017-07-12 NOTE — Progress Notes (Addendum)
Patient seen and examined by Dr. Domenic Polite. (please see his note) He recommends cardiac cath. Spoke with cath lab and they are available to have cath completed today. Cath orders are written and she will be transferred straight to cath lab. More recommendation post procedure.   I have spoke to the patient about the procedure and she is willing to be transferred and have cath.  The patient understands that risks include but are not limited to stroke (1 in 1000), death (1 in 76), kidney failure [usually temporary] (1 in 500), bleeding (1 in 200), allergic reaction [possibly serious] (1 in 200), and agrees to proceed.

## 2017-07-12 NOTE — Progress Notes (Signed)
Night shift floor coverage note.  The nursing staff notified me that the patient became hypotensive after metoprolol tartrate 25 mg p.o. was given.  The patient is otherwise in no acute distress.  She also takes lisinopril 10 with hydrochlorothiazide 12.5 mg p.o. at home, which was held.  Her blood glucose was 210 mg/dL earlier, for which she received regular insulin per sliding scale. I will give a 1000 mL 1/2 NS bolus over an hour.  Continue to monitor blood pressure.  Tennis Must, MD

## 2017-07-12 NOTE — ED Notes (Signed)
Jeannetta Nap (daughter) requests that she be notified for any changes or when pt is transferred. Cell phone [phone].

## 2017-07-12 NOTE — Discharge Instructions (Signed)
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
## 2017-07-13 ENCOUNTER — Encounter (HOSPITAL_COMMUNITY): Payer: Self-pay | Admitting: Cardiovascular Disease

## 2017-07-13 LAB — HIV ANTIBODY (ROUTINE TESTING W REFLEX): HIV Screen 4th Generation wRfx: NONREACTIVE

## 2017-07-31 ENCOUNTER — Encounter: Payer: Self-pay | Admitting: Student

## 2017-07-31 ENCOUNTER — Ambulatory Visit: Payer: Self-pay | Admitting: Student

## 2017-07-31 NOTE — Progress Notes (Deleted)
Cardiology Office Note    Date:  07/31/2017   ID:  [name], DOB Aug 25, 1960, MRN [id number]  PCP:  Patient, No Pcp Per  Cardiologist: Dr. Domenic Polite  No chief complaint on file.   History of Present Illness:    [name] is a 57 y.o. female with past medical history of HTN, HLD, and IDDM who presents to the office today for hospital follow-up.   She was recently admitted to [facility] on 07/11/2017 for evaluation of chest pain and dyspnea on exertion which was present with minimal exertion. Cyclic troponin values were negative and her EKG showed no acute ischemic changes, but given that her symptoms were concerning for unstable angina and in the setting of her multiple cardiac risk factors, she was transferred to [facility] for a cardiac catheterization. This was performed on 07/12/2017 and showed normal coronary arteries with a preserved EF of 55-65%. She was started on Atorvastatin 40mg  daily in the setting of her Type 2 DM.     Past Medical History:  Diagnosis Date  . Chest pain    a. 06/2017: cath showing normal cors with a preserved EF of 55-65%.   . Depression   . Diabetes mellitus   . Hypertension   . Neuropathy   . Pneumonia   . Sepsis [facility])     Past Surgical History:  Procedure Laterality Date  . ABDOMINAL HYSTERECTOMY    . APPENDECTOMY    . CESAREAN SECTION    . CHOLECYSTECTOMY    . LEFT HEART CATH AND CORONARY ANGIOGRAPHY N/A 07/12/2017   Procedure: LEFT HEART CATH AND CORONARY ANGIOGRAPHY;  Surgeon: Lorretta Harp, MD;  Location: [place] CV LAB;  Service: Cardiovascular;  Laterality: N/A;    Current Medications: Outpatient Medications Prior to Visit  Medication Sig Dispense Refill  . atorvastatin (LIPITOR) 40 MG tablet Take 1 tablet (40 mg total) by mouth daily at 6 PM. 30 tablet 2  . carbamazepine (CARBATROL) 200 MG 12 hr capsule Take 200 mg by mouth 2 (two) times daily.    . DULoxetine (CYMBALTA) 60 MG capsule Take 60 mg by mouth  daily.    . furosemide (LASIX) 20 MG tablet Take 20 mg by mouth daily.    Marland Kitchen gabapentin (NEURONTIN) 600 MG tablet Take 600 mg by mouth 3 (three) times daily.    . insulin aspart (NOVOLOG) 100 UNIT/ML injection Inject 1-30 Units into the skin 2 (two) times daily. Per sliding scale-    . insulin detemir (LEVEMIR) 100 UNIT/ML injection Inject 60 Units into the skin every morning.    Marland Kitchen lisinopril-hydrochlorothiazide (PRINZIDE,ZESTORETIC) 10-12.5 MG tablet Take 1 tablet by mouth daily.     No facility-administered medications prior to visit.      Allergies:   Ibuprofen and Codeine   Social History   Socioeconomic History  . Marital status: Married    Spouse name: Not on file  . Number of children: Not on file  . Years of education: Not on file  . Highest education level: Not on file  Social Needs  . Financial resource strain: Not on file  . Food insecurity - worry: Not on file  . Food insecurity - inability: Not on file  . Transportation needs - medical: Not on file  . Transportation needs - non-medical: Not on file  Occupational History  . Occupation: Disability  Tobacco Use  . Smoking status: Never Smoker  . Smokeless tobacco: Never Used  Substance and Sexual Activity  .  Alcohol use: No  . Drug use: No  . Sexual activity: Yes    Birth control/protection: None  Other Topics Concern  . Not on file  Social History Narrative  . Not on file     Family History:  The patient's ***family history includes Alcohol abuse in her father; CAD (age of onset: 34) in her mother; Cancer in her mother; Diabetes in her father and mother; Heart failure in her mother; Hypertension in her mother.   Review of Systems:   Please see the history of present illness.     General:  No chills, fever, night sweats or weight changes.  Cardiovascular:  No chest pain, dyspnea on exertion, edema, orthopnea, palpitations, paroxysmal nocturnal dyspnea. Dermatological: No rash, lesions/masses Respiratory: No  cough, dyspnea Urologic: No hematuria, dysuria Abdominal:   No nausea, vomiting, diarrhea, bright red blood per rectum, melena, or hematemesis Neurologic:  No visual changes, wkns, changes in mental status. All other systems reviewed and are otherwise negative except as noted above.   Physical Exam:    VS:  There were no vitals taken for this visit.   General: Well developed, well nourished,female appearing in no acute distress. Head: Normocephalic, atraumatic, sclera non-icteric, no xanthomas, nares are without discharge.  Neck: No carotid bruits. JVD not elevated.  Lungs: Respirations regular and unlabored, without wheezes or rales.  Heart: ***Regular rate and rhythm. No S3 or S4.  No murmur, no rubs, or gallops appreciated. Abdomen: Soft, non-tender, non-distended with normoactive bowel sounds. No hepatomegaly. No rebound/guarding. No obvious abdominal masses. Msk:  Strength and tone appear normal for age. No joint deformities or effusions. Extremities: No clubbing or cyanosis. No edema.  Distal pedal pulses are 2+ bilaterally. Neuro: Alert and oriented X 3. Moves all extremities spontaneously. No focal deficits noted. Psych:  Responds to questions appropriately with a normal affect. Skin: No rashes or lesions noted  Wt Readings from Last 3 Encounters:  07/11/17 260 lb (117.9 kg)  01/29/11 247 lb (112 kg)        Studies/Labs Reviewed:   EKG:  EKG is*** ordered today.  The ekg ordered today demonstrates ***  Recent Labs: 07/11/2017: ALT 17; B Natriuretic Peptide 10.0; BUN 33; Creatinine, Ser 1.12; Potassium 4.5; Sodium 140; TSH 0.985 07/12/2017: Hemoglobin 12.1; Magnesium 1.8; Platelets 211   Lipid Panel    Component Value Date/Time   CHOL 184 07/12/2017 0603   TRIG 426 (H) 07/12/2017 0603   HDL 36 (L) 07/12/2017 0603   CHOLHDL 5.1 07/12/2017 0603   VLDL UNABLE TO CALCULATE IF TRIGLYCERIDE OVER 400 mg/dL 07/12/2017 0603   LDLCALC UNABLE TO CALCULATE IF TRIGLYCERIDE  OVER 400 mg/dL 07/12/2017 0603    Additional studies/ records that were reviewed today include:   Cardiac Catheterization: 07/12/2017   The left ventricular systolic function is normal.  LV end diastolic pressure is normal.  The left ventricular ejection fraction is 55-65% by visual estimate  IMPRESSION: [name] has normal coronary arteries and normal LV function. I believe her chest pain is noncardiac. The sheath was removed and a TR band was placed on the right wrist to achieve patent hemostasis. The patient left the lab in stable condition. Noncardiac causes of chest pain will be evaluated.   Assessment:    No diagnosis found.   Plan:   In order of problems listed above:  1. ***    Medication Adjustments/Labs and Tests Ordered: Current medicines are reviewed at length with the patient today.  Concerns regarding medicines are  outlined above.  Medication changes, Labs and Tests ordered today are listed in the Patient Instructions below. There are no Patient Instructions on file for this visit.   Signed, Erma Heritage, PA-C  07/31/2017 10:50 AM    [facility] [address] [place],  [address] Phone: [phone]

## 2017-08-08 ENCOUNTER — Encounter (HOSPITAL_COMMUNITY): Payer: Self-pay | Admitting: Emergency Medicine

## 2017-08-08 ENCOUNTER — Emergency Department (HOSPITAL_COMMUNITY): Payer: Medicare Other

## 2017-08-08 ENCOUNTER — Emergency Department (HOSPITAL_COMMUNITY)
Admission: EM | Admit: 2017-08-08 | Discharge: 2017-08-09 | Disposition: A | Discharge status: Home / Self Care | Payer: Medicare Other | Attending: Emergency Medicine | Admitting: Emergency Medicine

## 2017-08-08 ENCOUNTER — Other Ambulatory Visit: Payer: Self-pay

## 2017-08-08 DIAGNOSIS — S92512B Displaced fracture of proximal phalanx of left lesser toe(s), initial encounter for open fracture: Secondary | ICD-10-CM | POA: Insufficient documentation

## 2017-08-08 DIAGNOSIS — Y998 Other external cause status: Secondary | ICD-10-CM | POA: Insufficient documentation

## 2017-08-08 DIAGNOSIS — W2209XA Striking against other stationary object, initial encounter: Secondary | ICD-10-CM | POA: Diagnosis not present

## 2017-08-08 DIAGNOSIS — E119 Type 2 diabetes mellitus without complications: Secondary | ICD-10-CM | POA: Diagnosis not present

## 2017-08-08 DIAGNOSIS — I10 Essential (primary) hypertension: Secondary | ICD-10-CM | POA: Diagnosis not present

## 2017-08-08 DIAGNOSIS — Y929 Unspecified place or not applicable: Secondary | ICD-10-CM | POA: Diagnosis not present

## 2017-08-08 DIAGNOSIS — Y939 Activity, unspecified: Secondary | ICD-10-CM | POA: Insufficient documentation

## 2017-08-08 DIAGNOSIS — S99922A Unspecified injury of left foot, initial encounter: Secondary | ICD-10-CM | POA: Diagnosis present

## 2017-08-08 DIAGNOSIS — Z794 Long term (current) use of insulin: Secondary | ICD-10-CM | POA: Insufficient documentation

## 2017-08-08 LAB — URINALYSIS, ROUTINE W REFLEX MICROSCOPIC
Bilirubin Urine: NEGATIVE
Glucose, UA: NEGATIVE mg/dL
Hgb urine dipstick: NEGATIVE
Ketones, ur: NEGATIVE mg/dL
Leukocytes, UA: NEGATIVE
Nitrite: NEGATIVE
Protein, ur: NEGATIVE mg/dL
Specific Gravity, Urine: 1.02 (ref 1.005–1.030)
pH: 5 (ref 5.0–8.0)

## 2017-08-08 LAB — CBC
HCT: 39.9 % (ref 36.0–46.0)
Hemoglobin: 12.9 g/dL (ref 12.0–15.0)
MCH: 31.6 pg (ref 26.0–34.0)
MCHC: 32.3 g/dL (ref 30.0–36.0)
MCV: 97.8 fL (ref 78.0–100.0)
Platelets: 220 10*3/uL (ref 150–400)
RBC: 4.08 MIL/uL (ref 3.87–5.11)
RDW: 13.4 % (ref 11.5–15.5)
WBC: 7.9 10*3/uL (ref 4.0–10.5)

## 2017-08-08 LAB — BASIC METABOLIC PANEL
Anion gap: 10 (ref 5–15)
BUN: 37 mg/dL — ABNORMAL HIGH (ref 6–20)
CO2: 22 mmol/L (ref 22–32)
Calcium: 8.8 mg/dL — ABNORMAL LOW (ref 8.9–10.3)
Chloride: 107 mmol/L (ref 101–111)
Creatinine, Ser: 1.21 mg/dL — ABNORMAL HIGH (ref 0.44–1.00)
GFR calc Af Amer: 58 mL/min — ABNORMAL LOW (ref >60)
GFR calc non Af Amer: 50 mL/min — ABNORMAL LOW (ref >60)
Glucose, Bld: 216 mg/dL — ABNORMAL HIGH (ref 65–99)
Potassium: 4.6 mmol/L (ref 3.5–5.1)
Sodium: 139 mmol/L (ref 135–145)

## 2017-08-08 LAB — CBG MONITORING, ED: GLUCOSE-CAPILLARY: 194 mg/dL — AB (ref 65–99)

## 2017-08-08 MED ORDER — CHLORHEXIDINE GLUCONATE 4 % EX LIQD: Freq: Every day | CUTANEOUS | 0 refills | Status: DC | PRN

## 2017-08-08 MED ORDER — KETOROLAC TROMETHAMINE 30 MG/ML IJ SOLN
30.0000 mg | Freq: Once | INTRAMUSCULAR | Status: AC
  Administered 2017-08-08: 30 mg via INTRAMUSCULAR
  Filled 2017-08-08: qty 1

## 2017-08-08 MED ORDER — CEPHALEXIN 500 MG PO CAPS: 500.0000 mg | ORAL_CAPSULE | Freq: Four times a day (QID) | ORAL | 0 refills | Status: DC

## 2017-08-08 MED ORDER — BACITRACIN ZINC 500 UNIT/GM EX OINT
1.0000 "application " | TOPICAL_OINTMENT | Freq: Two times a day (BID) | CUTANEOUS | Status: DC
  Administered 2017-08-08: 1 via TOPICAL
  Filled 2017-08-08: qty 0.9

## 2017-08-08 MED ORDER — FLUCONAZOLE 150 MG PO TABS: 150.0000 mg | ORAL_TABLET | Freq: Once | ORAL | 0 refills | Status: AC

## 2017-08-08 MED ORDER — HYDROCODONE-ACETAMINOPHEN 5-325 MG PO TABS: 1.0000 | ORAL_TABLET | Freq: Four times a day (QID) | ORAL | 0 refills | Status: DC | PRN

## 2017-08-08 NOTE — ED Provider Notes (Signed)
[place] EMERGENCY DEPARTMENT Provider Note   CSN: [id number] Arrival date & time: 08/08/17  1737     History   Chief Complaint Chief Complaint  Patient presents with  . Fall  . Open Fracture    HPI [name] is a 57 y.o. female.  Patient with PMH of DM, HTN, presents to the ED with a chief complaint of left foot injury.  She states that 2 nights ago she stubbed her toe and sustained an open fracture.  She states that she was admitted at [facility] with the plan of seeing the orthopedic, but reports that they didn't have an orthopedic on call for the next 2 days, so the patient and her RN daughter left and come to [facility].  She was given Ancef at [facility] and treated with a posterior splint.   The history is provided by the patient. No language interpreter was used.    Past Medical History:  Diagnosis Date  . Chest pain    a. 06/2017: cath showing normal cors with a preserved EF of 55-65%.   . Depression   . Diabetes mellitus   . Hypertension   . Neuropathy   . Pneumonia   . Sepsis [facility])     Patient Active Problem List   Diagnosis Date Noted  . Atypical chest pain 07/12/2017  . Chest pain 07/11/2017  . Unstable angina ([place]) 07/11/2017  . DIZZINESS 04/25/2010  . CHEST PAIN, ATYPICAL 04/25/2010  . ABDOMINAL PAIN 12/03/2009  . PALPITATIONS 11/15/2009  . OBESITY 10/07/2009  . TROCHANTERIC BURSITIS, LEFT 10/07/2009  . Diabetes mellitus type 2 in obese ([place]) 09/30/2009  . DEPRESSION 09/30/2009  . MIGRAINE HEADACHE 09/30/2009  . Essential hypertension 09/30/2009  . GERD 04/15/2009  . DIARRHEA, CHRONIC 04/15/2009    Past Surgical History:  Procedure Laterality Date  . ABDOMINAL HYSTERECTOMY    . APPENDECTOMY    . CESAREAN SECTION    . CHOLECYSTECTOMY    . LEFT HEART CATH AND CORONARY ANGIOGRAPHY N/A 07/12/2017   Procedure: LEFT HEART CATH AND CORONARY ANGIOGRAPHY;  Surgeon: Lorretta Harp, MD;  Location: [place] [facility]  LAB;  Service: Cardiovascular;  Laterality: N/A;    OB History    Gravida Para Term Preterm AB Living   5 3 3   2      SAB TAB Ectopic Multiple Live Births                   Home Medications    Prior to Admission medications   Medication Sig Start Date End Date Taking? Authorizing Provider  carbamazepine (CARBATROL) 200 MG 12 hr capsule Take 200 mg by mouth 2 (two) times daily.   Yes [provider]  DULoxetine (CYMBALTA) 60 MG capsule Take 60 mg by mouth 2 (two) times daily.    Yes [provider]  furosemide (LASIX) 20 MG tablet Take 20 mg by mouth daily.   Yes [provider]  gabapentin (NEURONTIN) 600 MG tablet Take 600 mg by mouth 3 (three) times daily.   Yes [provider]  insulin aspart (NOVOLOG) 100 UNIT/ML injection Inject 1-30 Units into the skin 2 (two) times daily. Per sliding scale-   Yes [provider]  insulin detemir (LEVEMIR) 100 UNIT/ML injection Inject 60 Units into the skin every morning.   Yes [provider]  lisinopril-hydrochlorothiazide (PRINZIDE,ZESTORETIC) 10-12.5 MG tablet Take 1 tablet by mouth daily.   Yes [provider]  atorvastatin (LIPITOR) 40 MG tablet  Take 1 tablet (40 mg total) by mouth daily at 6 PM. Patient not taking: Reported on 08/08/2017 07/12/17   Cheryln Manly, NP    Family History Family History  Problem Relation Age of Onset  . Diabetes Mother   . Heart failure Mother   . Hypertension Mother   . Cancer Mother   . CAD Mother 48       Died of MI  . Diabetes Father   . Alcohol abuse Father     Social History Social History   Tobacco Use  . Smoking status: Never Smoker  . Smokeless tobacco: Never Used  Substance Use Topics  . Alcohol use: No  . Drug use: No     Allergies   Ibuprofen and Codeine   Review of Systems Review of Systems  All other systems reviewed and are negative.    Physical Exam Updated Vital Signs BP (!) 122/59 (BP Location:  Right Arm)   Pulse 86   Temp 98.2 F (36.8 C) (Oral)   Resp 18   Ht 5\' 4"  (1.626 m)   Wt 120.2 kg (265 lb)   SpO2 97%   BMI 45.49 kg/m   Physical Exam  Constitutional: She is oriented to person, place, and time. She appears well-developed and well-nourished.  HENT:  Head: Normocephalic and atraumatic.  Eyes: Conjunctivae and EOM are normal.  Neck: Normal range of motion.  Cardiovascular: Normal rate and intact distal pulses.  Pulmonary/Chest: Effort normal.  Abdominal: She exhibits no distension.  Musculoskeletal: Normal range of motion.  Neurological: She is alert and oriented to person, place, and time.  Sensation intact  Skin: Skin is dry.  2 cm laceration to the left 4/5 metatarsal webspace  Psychiatric: She has a normal mood and affect. Her behavior is normal. Judgment and thought content normal.  Nursing note and vitals reviewed.      ED Treatments / Results  Labs (all labs ordered are listed, but only abnormal results are displayed) Labs Reviewed  BASIC METABOLIC PANEL - Abnormal; Notable for the following components:      Result Value   Glucose, Bld 216 (*)    BUN 37 (*)    Creatinine, Ser 1.21 (*)    Calcium 8.8 (*)    GFR calc non Af Amer 50 (*)    GFR calc Af Amer 58 (*)    All other components within normal limits  CBG MONITORING, ED - Abnormal; Notable for the following components:   Glucose-Capillary 194 (*)    All other components within normal limits  CBC  URINALYSIS, ROUTINE W REFLEX MICROSCOPIC    EKG  EKG Interpretation None       Radiology Dg Foot Complete Left  Result Date: 08/08/2017 CLINICAL DATA:  57 year old female with fall and fracture of the fifth toe. EXAM: LEFT FOOT - COMPLETE 3+ VIEW COMPARISON:  None. FINDINGS: There is a minimally displaced and angulated fracture of the base of the proximal phalanx of the fifth digit. There is chronic appearing irregularity of the DIP joint of the fifth digit. Clinical correlation is  recommended there is no dislocation. The bones are osteopenic. There is diffuse soft tissue swelling of the foot. Vascular calcifications noted. IMPRESSION: 1. Mildly angulated fracture of the base of the proximal phalanx of the fifth digit. 2. Mild chronic appearing irregularity of the DIP joint of the fifth digit. 3. Diffuse soft tissue swelling and vascular calcification. Electronically Signed   By: Anner Crete M.D.   On:  08/08/2017 21:29    Procedures Procedures (including critical care time)  Medications Ordered in ED Medications  ketorolac (TORADOL) 30 MG/ML injection 30 mg (not administered)     Initial Impression / Assessment and Plan / ED Course  I have reviewed the triage vital signs and the nursing notes.  Pertinent labs & imaging results that were available during my care of the patient were reviewed by me and considered in my medical decision making (see chart for details).     Patient with open left 5th toe fracture.  She sustained the injury 2 nights ago.  Was admitted at [facility] with the plan to see ortho, but they reportedly did not have an ortho on-call for the next 2 days.  She therefore elected to leave and come to [facility].    VSS.  Patient is well appearing.  Due to the length of time that the wound has been open, feel that primary closure is not indicated.  I showed the image to Dr. Gilford Raid, who also reviewed the x-ray with me.  She agrees that primary closure is not indicated, and may be harmful due to the length of time that it has been open.  Recommends discharge with PCP/ortho follow-up.  Will discharge with abx and good wound care.  Will give camwalker.  Final Clinical Impressions(s) / ED Diagnoses   Final diagnoses:  Open displaced fracture of proximal phalanx of lesser toe of left foot, initial encounter    ED Discharge Orders        Ordered    cephALEXin (KEFLEX) 500 MG capsule  4 times daily     08/08/17 2310    chlorhexidine (HIBICLENS) 4 %  external liquid  Daily PRN     08/08/17 2310    HYDROcodone-acetaminophen (NORCO/VICODIN) 5-325 MG tablet  Every 6 hours PRN     08/08/17 2310       Montine Circle, PA-C 08/08/17 2311    Isla Pence, MD 08/08/17 2319

## 2017-08-08 NOTE — ED Notes (Signed)
Pt coming from [facility], pt fell last night and has open fx to L 5th toe. Pt was to be admitted for surgery but family states ortho was taking too long so they decided to come to [facility] instead. Pt foot is currently splinted. Verbal orders given per Merry Proud PA.

## 2017-08-24 DIAGNOSIS — R7989 Other specified abnormal findings of blood chemistry: Secondary | ICD-10-CM | POA: Insufficient documentation

## 2018-04-27 IMAGING — DX DG FOOT COMPLETE 3+V*L*
5 series · 5 of 5 positions shown · non-contrast
Comparison: None.

CLINICAL DATA: 56-year-old female with fall and fracture of the
fifth toe.

EXAM:
LEFT FOOT - COMPLETE 3+ VIEW

[x foot ap left]
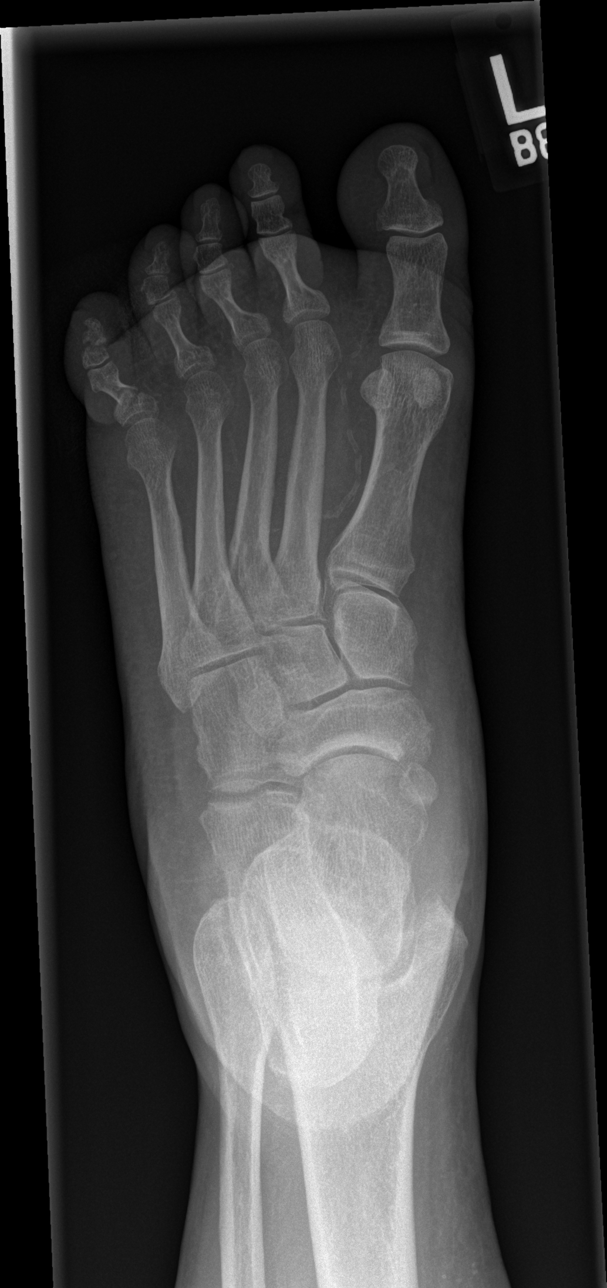

[x foot obl left]
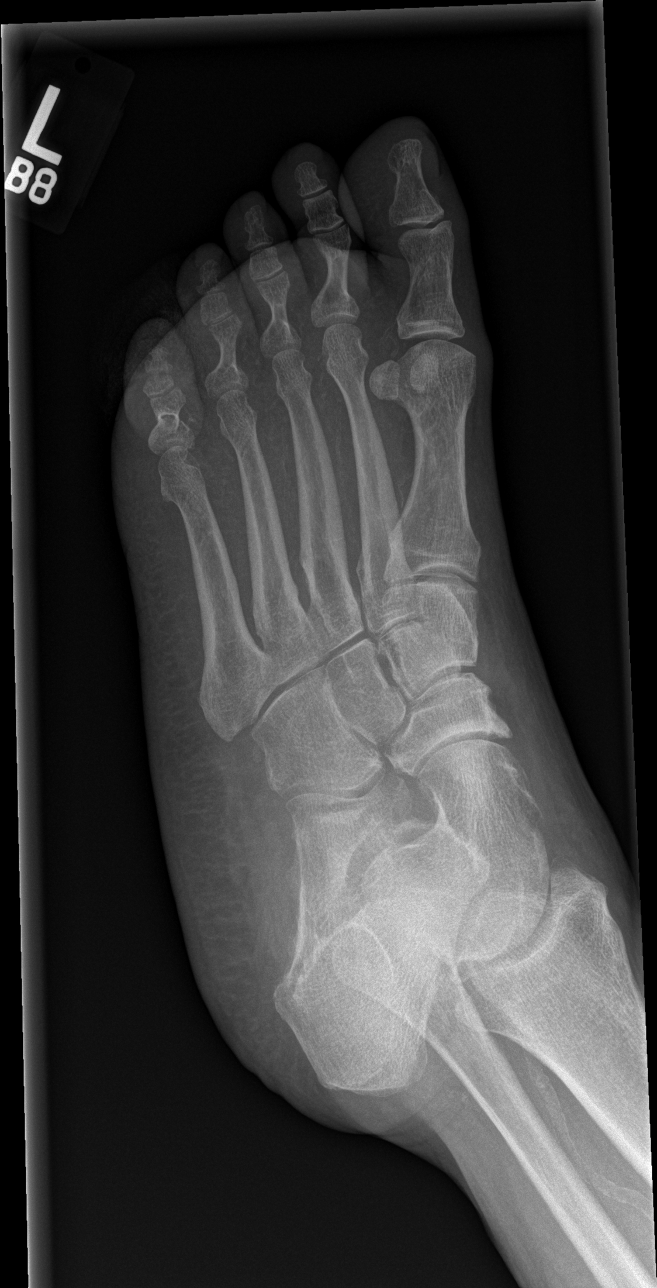

[x foot lat left (1 of 3)]
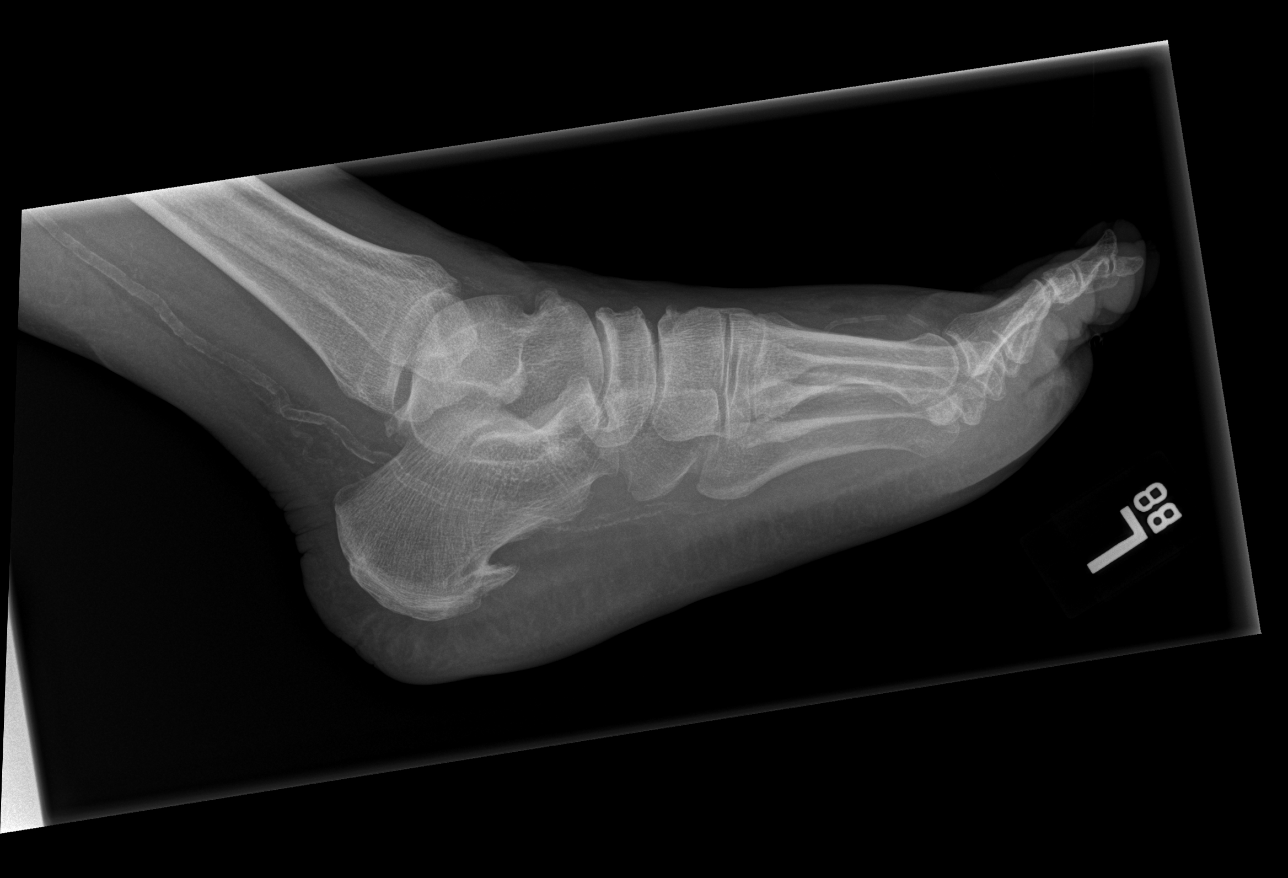

[x foot lat left (2 of 3)]
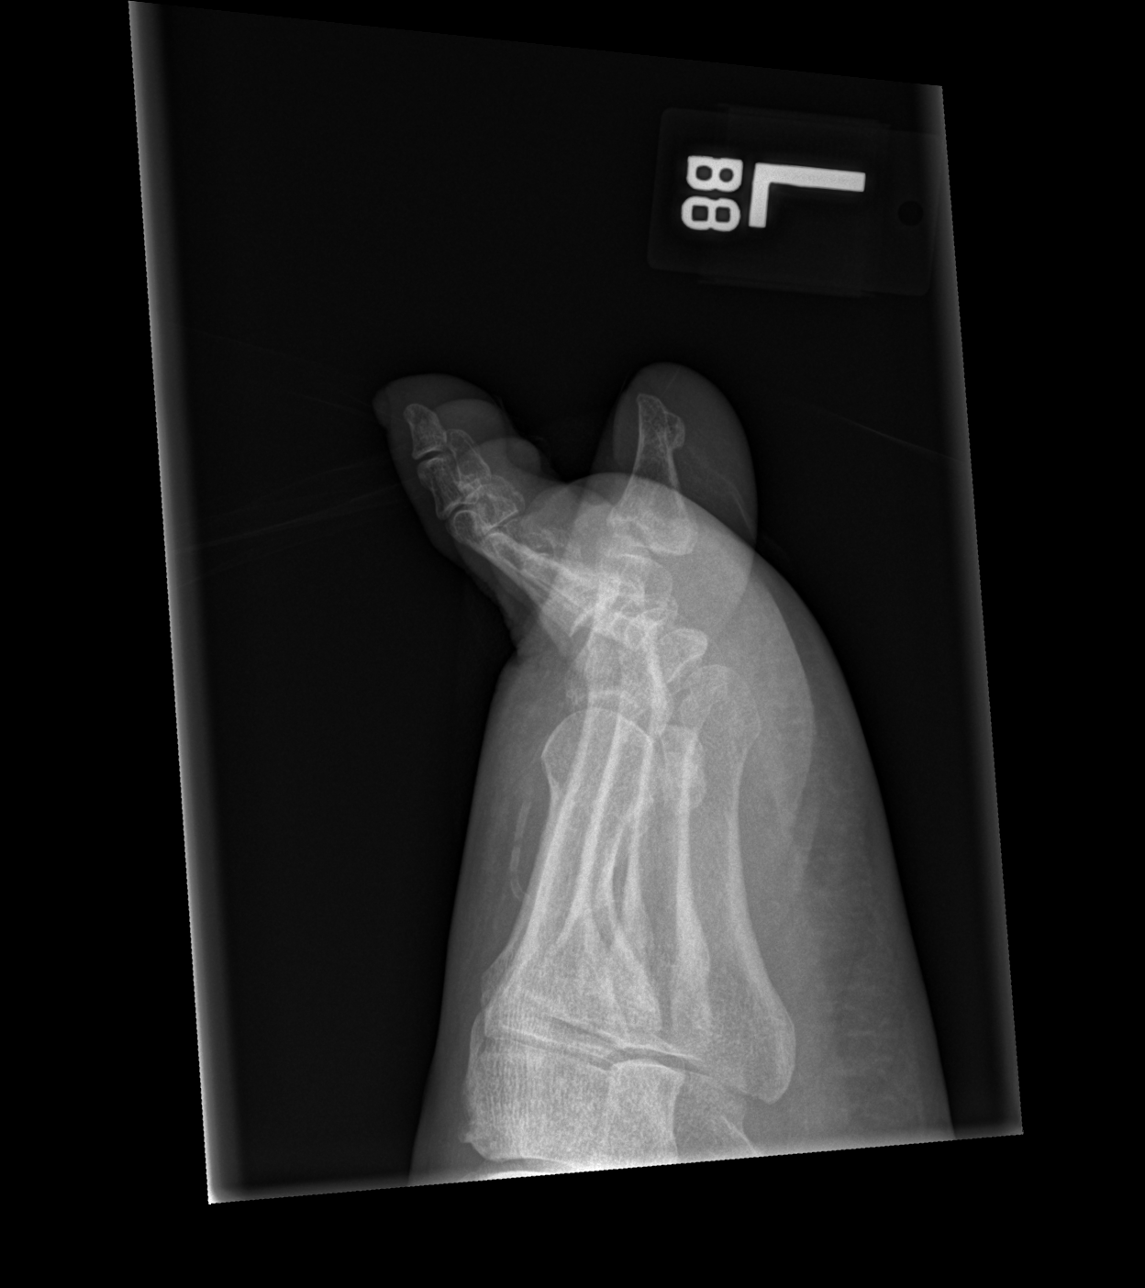

[x foot lat left (3 of 3)]
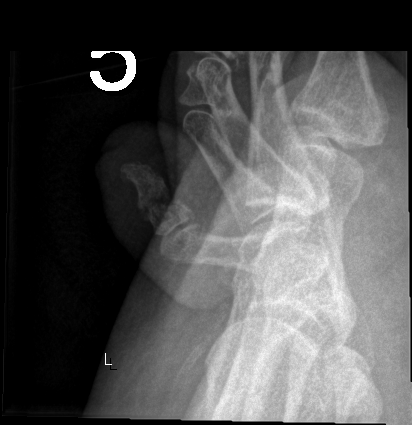

[5 of 5 positions shown; findings below may reference images not displayed]

FINDINGS: There is a minimally displaced and angulated fracture of the base of
the proximal phalanx of the fifth digit. There is chronic appearing
irregularity of the DIP joint of the fifth digit. Clinical
correlation is recommended there is no dislocation. The bones are
osteopenic. There is diffuse soft tissue swelling of the foot.
Vascular calcifications noted.
IMPRESSION: 1. Mildly angulated fracture of the base of the proximal phalanx of
the fifth digit.
2. Mild chronic appearing irregularity of the DIP joint of the fifth
digit.
3. Diffuse soft tissue swelling and vascular calcification.

## 2021-05-16 ENCOUNTER — Encounter (HOSPITAL_COMMUNITY): Payer: Self-pay | Admitting: Emergency Medicine

## 2021-05-16 ENCOUNTER — Other Ambulatory Visit: Payer: Self-pay

## 2021-05-16 ENCOUNTER — Emergency Department (HOSPITAL_COMMUNITY): Payer: MEDICARE

## 2021-05-16 ENCOUNTER — Inpatient Hospital Stay (HOSPITAL_COMMUNITY)
Admission: EM | Admit: 2021-05-16 | Discharge: 2021-05-18 | DRG: 683 | Disposition: A | Discharge status: Home / Self Care | Payer: MEDICARE | Attending: Emergency Medicine | Admitting: Emergency Medicine

## 2021-05-16 DIAGNOSIS — Z886 Allergy status to analgesic agent status: Secondary | ICD-10-CM | POA: Diagnosis not present

## 2021-05-16 DIAGNOSIS — Z79899 Other long term (current) drug therapy: Secondary | ICD-10-CM | POA: Diagnosis not present

## 2021-05-16 DIAGNOSIS — E1122 Type 2 diabetes mellitus with diabetic chronic kidney disease: Secondary | ICD-10-CM | POA: Diagnosis not present

## 2021-05-16 DIAGNOSIS — Z811 Family history of alcohol abuse and dependence: Secondary | ICD-10-CM | POA: Diagnosis not present

## 2021-05-16 DIAGNOSIS — E1140 Type 2 diabetes mellitus with diabetic neuropathy, unspecified: Secondary | ICD-10-CM | POA: Diagnosis not present

## 2021-05-16 DIAGNOSIS — Z20822 Contact with and (suspected) exposure to covid-19: Secondary | ICD-10-CM | POA: Diagnosis present

## 2021-05-16 DIAGNOSIS — F329 Major depressive disorder, single episode, unspecified: Secondary | ICD-10-CM | POA: Diagnosis present

## 2021-05-16 DIAGNOSIS — N1831 Chronic kidney disease, stage 3a: Secondary | ICD-10-CM | POA: Diagnosis not present

## 2021-05-16 DIAGNOSIS — Z7984 Long term (current) use of oral hypoglycemic drugs: Secondary | ICD-10-CM | POA: Diagnosis not present

## 2021-05-16 DIAGNOSIS — N179 Acute kidney failure, unspecified: Principal | ICD-10-CM

## 2021-05-16 DIAGNOSIS — N390 Urinary tract infection, site not specified: Secondary | ICD-10-CM | POA: Diagnosis present

## 2021-05-16 DIAGNOSIS — Z885 Allergy status to narcotic agent status: Secondary | ICD-10-CM

## 2021-05-16 DIAGNOSIS — Z8249 Family history of ischemic heart disease and other diseases of the circulatory system: Secondary | ICD-10-CM

## 2021-05-16 DIAGNOSIS — I10 Essential (primary) hypertension: Secondary | ICD-10-CM | POA: Diagnosis present

## 2021-05-16 DIAGNOSIS — Z993 Dependence on wheelchair: Secondary | ICD-10-CM

## 2021-05-16 DIAGNOSIS — R350 Frequency of micturition: Secondary | ICD-10-CM | POA: Diagnosis present

## 2021-05-16 DIAGNOSIS — G43909 Migraine, unspecified, not intractable, without status migrainosus: Secondary | ICD-10-CM | POA: Diagnosis present

## 2021-05-16 DIAGNOSIS — Z833 Family history of diabetes mellitus: Secondary | ICD-10-CM | POA: Diagnosis not present

## 2021-05-16 DIAGNOSIS — Z6841 Body Mass Index (BMI) 40.0 and over, adult: Secondary | ICD-10-CM | POA: Diagnosis not present

## 2021-05-16 DIAGNOSIS — E669 Obesity, unspecified: Secondary | ICD-10-CM | POA: Diagnosis present

## 2021-05-16 DIAGNOSIS — Z794 Long term (current) use of insulin: Secondary | ICD-10-CM | POA: Diagnosis not present

## 2021-05-16 DIAGNOSIS — R443 Hallucinations, unspecified: Secondary | ICD-10-CM | POA: Diagnosis not present

## 2021-05-16 DIAGNOSIS — I959 Hypotension, unspecified: Secondary | ICD-10-CM | POA: Diagnosis present

## 2021-05-16 DIAGNOSIS — K219 Gastro-esophageal reflux disease without esophagitis: Secondary | ICD-10-CM | POA: Diagnosis present

## 2021-05-16 DIAGNOSIS — I129 Hypertensive chronic kidney disease with stage 1 through stage 4 chronic kidney disease, or unspecified chronic kidney disease: Secondary | ICD-10-CM | POA: Diagnosis present

## 2021-05-16 DIAGNOSIS — E785 Hyperlipidemia, unspecified: Secondary | ICD-10-CM | POA: Diagnosis present

## 2021-05-16 DIAGNOSIS — E119 Type 2 diabetes mellitus without complications: Secondary | ICD-10-CM

## 2021-05-16 DIAGNOSIS — F32A Depression, unspecified: Secondary | ICD-10-CM | POA: Diagnosis present

## 2021-05-16 DIAGNOSIS — Z809 Family history of malignant neoplasm, unspecified: Secondary | ICD-10-CM | POA: Diagnosis not present

## 2021-05-16 DIAGNOSIS — E875 Hyperkalemia: Secondary | ICD-10-CM | POA: Diagnosis not present

## 2021-05-16 LAB — COMPREHENSIVE METABOLIC PANEL
ALT: 14 U/L (ref 0–44)
AST: 13 U/L — ABNORMAL LOW (ref 15–41)
Albumin: 3.5 g/dL (ref 3.5–5.0)
Alkaline Phosphatase: 125 U/L (ref 38–126)
Anion gap: 8 (ref 5–15)
BUN: 72 mg/dL — ABNORMAL HIGH (ref 6–20)
CO2: 26 mmol/L (ref 22–32)
Calcium: 8.2 mg/dL — ABNORMAL LOW (ref 8.9–10.3)
Chloride: 102 mmol/L (ref 98–111)
Creatinine, Ser: 3.08 mg/dL — ABNORMAL HIGH (ref 0.44–1.00)
GFR, Estimated: 17 mL/min — ABNORMAL LOW (ref >60)
Glucose, Bld: 188 mg/dL — ABNORMAL HIGH (ref 70–99)
Potassium: 5.5 mmol/L — ABNORMAL HIGH (ref 3.5–5.1)
Sodium: 136 mmol/L (ref 135–145)
Total Bilirubin: 0.6 mg/dL (ref 0.3–1.2)
Total Protein: 6.6 g/dL (ref 6.5–8.1)

## 2021-05-16 LAB — RESP PANEL BY RT-PCR (FLU A&B, COVID) ARPGX2
Influenza A by PCR: NEGATIVE
Influenza B by PCR: NEGATIVE
SARS Coronavirus 2 by RT PCR: NEGATIVE

## 2021-05-16 LAB — CBC WITH DIFFERENTIAL/PLATELET
Abs Immature Granulocytes: 0.06 10*3/uL (ref 0.00–0.07)
Basophils Absolute: 0.1 10*3/uL (ref 0.0–0.1)
Basophils Relative: 1 %
Eosinophils Absolute: 0.3 10*3/uL (ref 0.0–0.5)
Eosinophils Relative: 3 %
HCT: 38.6 % (ref 36.0–46.0)
Hemoglobin: 12.2 g/dL (ref 12.0–15.0)
Immature Granulocytes: 1 %
Lymphocytes Relative: 18 %
Lymphs Abs: 1.8 10*3/uL (ref 0.7–4.0)
MCH: 31.2 pg (ref 26.0–34.0)
MCHC: 31.6 g/dL (ref 30.0–36.0)
MCV: 98.7 fL (ref 80.0–100.0)
Monocytes Absolute: 0.6 10*3/uL (ref 0.1–1.0)
Monocytes Relative: 6 %
Neutro Abs: 7.3 10*3/uL (ref 1.7–7.7)
Neutrophils Relative %: 71 %
Platelets: 206 10*3/uL (ref 150–400)
RBC: 3.91 MIL/uL (ref 3.87–5.11)
RDW: 13.1 % (ref 11.5–15.5)
WBC: 10.1 10*3/uL (ref 4.0–10.5)
nRBC: 0 % (ref 0.0–0.2)

## 2021-05-16 LAB — URINALYSIS, ROUTINE W REFLEX MICROSCOPIC
Bilirubin Urine: NEGATIVE
Glucose, UA: NEGATIVE mg/dL
Hgb urine dipstick: NEGATIVE
Ketones, ur: NEGATIVE mg/dL
Leukocytes,Ua: NEGATIVE
Nitrite: POSITIVE — AB
Protein, ur: 30 mg/dL — AB
Specific Gravity, Urine: 1.02 (ref 1.005–1.030)
pH: 5 (ref 5.0–8.0)

## 2021-05-16 LAB — LIPASE, BLOOD: Lipase: 25 U/L (ref 11–51)

## 2021-05-16 LAB — LACTIC ACID, PLASMA: Lactic Acid, Venous: 1.6 mmol/L (ref 0.5–1.9)

## 2021-05-16 MED ORDER — HEPARIN SODIUM (PORCINE) 5000 UNIT/ML IJ SOLN
5000.0000 [IU] | Freq: Three times a day (TID) | INTRAMUSCULAR | Status: DC
  Administered 2021-05-16 – 2021-05-18 (×6): 5000 [IU] via SUBCUTANEOUS
  Filled 2021-05-16 (×6): qty 1

## 2021-05-16 MED ORDER — SODIUM ZIRCONIUM CYCLOSILICATE 5 G PO PACK
10.0000 g | PACK | Freq: Once | ORAL | Status: AC
  Administered 2021-05-16: 10 g via ORAL
  Filled 2021-05-16: qty 2

## 2021-05-16 MED ORDER — SENNA 8.6 MG PO TABS
1.0000 | ORAL_TABLET | Freq: Two times a day (BID) | ORAL | Status: DC
  Administered 2021-05-16 – 2021-05-18 (×4): 8.6 mg via ORAL
  Filled 2021-05-16 (×3): qty 1

## 2021-05-16 MED ORDER — ONDANSETRON HCL 4 MG/2ML IJ SOLN: 4.0000 mg | Freq: Four times a day (QID) | INTRAMUSCULAR | Status: DC | PRN

## 2021-05-16 MED ORDER — SODIUM CHLORIDE 0.9 % IV SOLN: INTRAVENOUS | Status: DC

## 2021-05-16 MED ORDER — DOCUSATE SODIUM 100 MG PO CAPS
100.0000 mg | ORAL_CAPSULE | Freq: Two times a day (BID) | ORAL | Status: DC
  Administered 2021-05-16 – 2021-05-18 (×4): 100 mg via ORAL
  Filled 2021-05-16 (×4): qty 1

## 2021-05-16 MED ORDER — ONDANSETRON HCL 4 MG/2ML IJ SOLN
4.0000 mg | Freq: Once | INTRAMUSCULAR | Status: AC
  Administered 2021-05-16: 4 mg via INTRAVENOUS
  Filled 2021-05-16: qty 2

## 2021-05-16 MED ORDER — ACETAMINOPHEN 650 MG RE SUPP: 650.0000 mg | Freq: Four times a day (QID) | RECTAL | Status: DC | PRN

## 2021-05-16 MED ORDER — ONDANSETRON HCL 4 MG PO TABS
4.0000 mg | ORAL_TABLET | Freq: Four times a day (QID) | ORAL | Status: DC | PRN
  Filled 2021-05-16: qty 1

## 2021-05-16 MED ORDER — SODIUM CHLORIDE 0.9 % IV BOLUS
500.0000 mL | Freq: Once | INTRAVENOUS | Status: AC
  Administered 2021-05-16: 500 mL via INTRAVENOUS

## 2021-05-16 MED ORDER — DULOXETINE HCL 60 MG PO CPEP
60.0000 mg | ORAL_CAPSULE | Freq: Two times a day (BID) | ORAL | Status: DC
  Administered 2021-05-16 – 2021-05-18 (×4): 60 mg via ORAL
  Filled 2021-05-16 (×3): qty 1
  Filled 2021-05-16: qty 2

## 2021-05-16 MED ORDER — INSULIN ASPART 100 UNIT/ML IJ SOLN
0.0000 [IU] | Freq: Three times a day (TID) | INTRAMUSCULAR | Status: DC
  Administered 2021-05-17: 2 [IU] via SUBCUTANEOUS
  Administered 2021-05-17: 3 [IU] via SUBCUTANEOUS
  Administered 2021-05-17: 1 [IU] via SUBCUTANEOUS
  Administered 2021-05-18: 3 [IU] via SUBCUTANEOUS
  Administered 2021-05-18: 5 [IU] via SUBCUTANEOUS

## 2021-05-16 MED ORDER — CEPHALEXIN 500 MG PO CAPS
500.0000 mg | ORAL_CAPSULE | Freq: Two times a day (BID) | ORAL | Status: DC
  Administered 2021-05-16 – 2021-05-18 (×4): 500 mg via ORAL
  Filled 2021-05-16 (×4): qty 1

## 2021-05-16 MED ORDER — ACETAMINOPHEN 325 MG PO TABS
650.0000 mg | ORAL_TABLET | Freq: Four times a day (QID) | ORAL | Status: DC | PRN
  Administered 2021-05-17: 650 mg via ORAL
  Filled 2021-05-16 (×2): qty 2

## 2021-05-16 NOTE — ED Provider Notes (Signed)
[facility] EMERGENCY DEPARTMENT Provider Note   CSN: [id number] Arrival date & time: 05/16/21  1259     History Chief Complaint  Patient presents with   Hallucinations    [name] is a 60 y.o. female.  HPI  60 year old female with a history of chest pain, depression, diabetes, hypertension, neuropathy, pneumonia, sepsis, who presents to the emergency department today for evaluation of multiple complaints.  Her main complaint today is that she is having some dysuria, frequency and urgency.  She feels that she has a UTI.  She further complains that she is had hallucinations of a book that she is not able to read and an umbrella that she was unable to get off of herself.  She states that this is very typical for her when she has a urinary tract infection.  It is happened several times in the past.  She has various other complaints such as jerking of the upper and lower extremities which she has been going on for months.  She is from out of town and states that she is planning on following up about this in her hometown with her regular doctors. She also had a fall a few days ago and is c/o back pain. She does not think she hit her head.  Past Medical History:  Diagnosis Date   Chest pain    a. 06/2017: cath showing normal cors with a preserved EF of 55-65%.    Depression    Diabetes mellitus    Hypertension    Neuropathy    Pneumonia    Sepsis [facility])     Patient Active Problem List   Diagnosis Date Noted   Atypical chest pain 07/12/2017   Chest pain 07/11/2017   Unstable angina ([place]) 07/11/2017   DIZZINESS 04/25/2010   CHEST PAIN, ATYPICAL 04/25/2010   ABDOMINAL PAIN 12/03/2009   PALPITATIONS 11/15/2009   OBESITY 10/07/2009   TROCHANTERIC BURSITIS, LEFT 10/07/2009   Diabetes mellitus type 2 in obese ([place]) 09/30/2009   DEPRESSION 09/30/2009   MIGRAINE HEADACHE 09/30/2009   Essential hypertension 09/30/2009   GERD 04/15/2009   DIARRHEA, CHRONIC 04/15/2009     Past Surgical History:  Procedure Laterality Date   ABDOMINAL HYSTERECTOMY     APPENDECTOMY     CESAREAN SECTION     CHOLECYSTECTOMY     LEFT HEART CATH AND CORONARY ANGIOGRAPHY N/A 07/12/2017   Procedure: LEFT HEART CATH AND CORONARY ANGIOGRAPHY;  Surgeon: Lorretta Harp, MD;  Location: [facility];  Service: Cardiovascular;  Laterality: N/A;     OB History     Gravida  5   Para  3   Term  3   Preterm      AB  2   Living         SAB      IAB      Ectopic      Multiple      Live Births              Family History  Problem Relation Age of Onset   Diabetes Mother    Heart failure Mother    Hypertension Mother    Cancer Mother    CAD Mother 68       Died of MI   Diabetes Father    Alcohol abuse Father     Social History   Tobacco Use   Smoking status: Never   Smokeless tobacco: Never  Substance Use Topics   Alcohol use:  No   Drug use: No    Home Medications Prior to Admission medications   Medication Sig Start Date End Date Taking? Authorizing Provider  atorvastatin (LIPITOR) 40 MG tablet Take 1 tablet (40 mg total) by mouth daily at 6 PM. Patient not taking: Reported on 08/08/2017 07/12/17   Cheryln Manly, NP  carbamazepine (CARBATROL) 200 MG 12 hr capsule Take 200 mg by mouth 2 (two) times daily.    [provider]  cephALEXin (KEFLEX) 500 MG capsule Take 1 capsule (500 mg total) by mouth 4 (four) times daily. 08/08/17   Montine Circle, PA-C  chlorhexidine (HIBICLENS) 4 % external liquid Apply topically daily as needed. 08/08/17   Montine Circle, PA-C  DULoxetine (CYMBALTA) 60 MG capsule Take 60 mg by mouth 2 (two) times daily.     [provider]  furosemide (LASIX) 20 MG tablet Take 20 mg by mouth daily.    [provider]  gabapentin (NEURONTIN) 600 MG tablet Take 600 mg by mouth 3 (three) times daily.    [provider]  HYDROcodone-acetaminophen (NORCO/VICODIN) 5-325 MG tablet Take  1-2 tablets by mouth every 6 (six) hours as needed. 08/08/17   Montine Circle, PA-C  insulin aspart (NOVOLOG) 100 UNIT/ML injection Inject 1-30 Units into the skin 2 (two) times daily. Per sliding scale-    [provider]  insulin detemir (LEVEMIR) 100 UNIT/ML injection Inject 60 Units into the skin every morning.    [provider]  lisinopril-hydrochlorothiazide (PRINZIDE,ZESTORETIC) 10-12.5 MG tablet Take 1 tablet by mouth daily.    [provider]    Allergies    Ibuprofen and Codeine  Review of Systems   Review of Systems  Constitutional:  Negative for chills and fever.  HENT:  Negative for ear pain and sore throat.   Eyes:  Negative for visual disturbance.  Respiratory:  Negative for cough and shortness of breath.   Cardiovascular:  Negative for chest pain.  Gastrointestinal:  Positive for nausea and vomiting. Negative for abdominal pain, constipation and diarrhea.  Genitourinary:  Positive for dysuria, frequency, hematuria and urgency.  Musculoskeletal:  Negative for back pain.  Skin:  Negative for rash.  Neurological:  Negative for syncope and headaches.  Psychiatric/Behavioral:  Positive for hallucinations.        No si or hi  All other systems reviewed and are negative.  Physical Exam Updated Vital Signs BP 116/64   Pulse 82   Temp 98 F (36.7 C) (Oral)   Resp 15   Ht 5\' 4"  (1.626 m)   Wt 129.3 kg   SpO2 93%   BMI 48.92 kg/m   Physical Exam Vitals and nursing note reviewed.  Constitutional:      General: She is not in acute distress.    Appearance: She is well-developed.  HENT:     Head: Normocephalic and atraumatic.  Eyes:     Conjunctiva/sclera: Conjunctivae normal.  Cardiovascular:     Rate and Rhythm: Normal rate and regular rhythm.     Heart sounds: Normal heart sounds. No murmur heard. Pulmonary:     Effort: Pulmonary effort is normal. No respiratory distress.     Breath sounds: Normal breath sounds. No wheezing, rhonchi  or rales.  Abdominal:     General: Bowel sounds are normal.     Palpations: Abdomen is soft.     Tenderness: There is no abdominal tenderness. There is no guarding or rebound.  Musculoskeletal:     Cervical back: Neck supple.  Comments: TTP to the lumbar spine.   Skin:    General: Skin is warm and dry.  Neurological:     Mental Status: She is alert.     Comments: Mental Status:  Alert, thought content appropriate, able to give a coherent history. Speech fluent without evidence of aphasia. Able to follow 2 step commands without difficulty.  Cranial Nerves:  II:   pupils equal, round, reactive to light III,IV, VI: ptosis not present, extra-ocular motions intact bilaterally  V,VII: smile symmetric, facial light touch sensation equal VIII: hearing grossly normal to voice  X: uvula elevates symmetrically  XI: bilateral shoulder shrug symmetric and strong XII: midline tongue extension without fassiculations Motor:  Normal tone. 4/5 strength of BUE and BLE major muscle groups including strong and equal grip strength and dorsiflexion/plantar flexion Sensory: light touch normal in all extremities.     ED Results / Procedures / Treatments   Labs (all labs ordered are listed, but only abnormal results are displayed) Labs Reviewed  COMPREHENSIVE METABOLIC PANEL - Abnormal; Notable for the following components:      Result Value   Potassium 5.5 (*)    Glucose, Bld 188 (*)    BUN 72 (*)    Creatinine, Ser 3.08 (*)    Calcium 8.2 (*)    AST 13 (*)    GFR, Estimated 17 (*)    All other components within normal limits  CULTURE, BLOOD (ROUTINE X 2)  CULTURE, BLOOD (ROUTINE X 2)  URINE CULTURE  CBC WITH DIFFERENTIAL/PLATELET  LIPASE, BLOOD  LACTIC ACID, PLASMA  URINALYSIS, ROUTINE W REFLEX MICROSCOPIC    EKG EKG Interpretation  Date/Time:  Monday May 16 2021 18:24:46 EDT Ventricular Rate:  83 PR Interval:  156 QRS Duration: 111 QT Interval:  379 QTC Calculation: 446 R  Axis:   -27 Text Interpretation: Sinus rhythm Borderline left axis deviation Low voltage, precordial leads Consider anterior infarct No significant change since prior 12/18 Confirmed by Aletta Edouard [phone]) on 05/16/2021 6:36:09 PM  Radiology CT Head Wo Contrast  Result Date: 05/16/2021 CLINICAL DATA:  Mental status change, unknown cause. EXAM: CT HEAD WITHOUT CONTRAST TECHNIQUE: Contiguous axial images were obtained from the base of the skull through the vertex without intravenous contrast. COMPARISON:  Brain MRI 04/28/2010. FINDINGS: Brain: Cerebral volume is normal for age. There is no acute intracranial hemorrhage. No demarcated cortical infarct. No extra-axial fluid collection. No evidence of an intracranial mass. No midline shift. Vascular: No hyperdense vessel. Atherosclerotic calcifications Skull: Normal. Negative for fracture or focal lesion. Sinuses/Orbits: Visualized orbits show no acute finding. Small mucous retention cyst within the left maxillary sinus. IMPRESSION: No evidence of acute intracranial abnormality. Small left maxillary sinus mucous retention cyst. Electronically Signed   By: Kellie Simmering D.O.   On: 05/16/2021 17:45    Procedures Procedures   CRITICAL CARE Performed by: Rodney Booze   Total critical care time: 37 minutes  Critical care time was exclusive of separately billable procedures and treating other patients.  Critical care was necessary to treat or prevent imminent or life-threatening deterioration.  Critical care was time spent personally by me on the following activities: development of treatment plan with patient and/or surrogate as well as nursing, discussions with consultants, evaluation of patient's response to treatment, examination of patient, obtaining history from patient or surrogate, ordering and performing treatments and interventions, ordering and review of laboratory studies, ordering and review of radiographic studies, pulse oximetry and  re-evaluation of patient's condition.   Medications  Ordered in ED Medications  sodium chloride 0.9 % bolus 500 mL (500 mLs Intravenous New Bag/Given 05/16/21 1608)  ondansetron (ZOFRAN) injection 4 mg (4 mg Intravenous Given 05/16/21 1848)  sodium zirconium cyclosilicate (LOKELMA) packet 10 g (10 g Oral Given 05/16/21 1852)    ED Course  I have reviewed the triage vital signs and the nursing notes.  Pertinent labs & imaging results that were available during my care of the patient were reviewed by me and considered in my medical decision making (see chart for details).    MDM Rules/Calculators/A&P                          60 y/o female presenting for urinary sxs and hallucinations. She states this is typical for her when she gets a UTI.  Reviewed/interpreted labs CBC wnl CMP with elevated renal function, increased from prior noted in chart at <2. Daughter at bedside states current baseline has been around 1.7. pt also with hyperkalemia and was given lokelma Lipase neg Lactic acid neg Blood cultures obtained UA pending pending on admission   Reviewed/interpreted imaging CT head -  No evidence of acute intracranial abnormality. Small left maxillary sinus mucous retention cyst  Pt with AKI on CKD. Also with hyperkalemia. Lokelma and ivf given in the ED. Will admit for further monitoring and treatment.   7:09 PM CONSULT With Dr. Dwyane Dee who accepts patient for admission   Final Clinical Impression(s) / ED Diagnoses Final diagnoses:  AKI (acute kidney injury) ([place])  Hyperkalemia    Rx / DC Orders ED Discharge Orders     None        Bishop Dublin 05/16/21 1910    Hayden Rasmussen, MD 05/17/21 1100

## 2021-05-16 NOTE — ED Notes (Signed)
Signature pad not working. 

## 2021-05-16 NOTE — ED Notes (Addendum)
Called Acute Care and spoke with Cindie Crumbly who will take report after she gets out of pts room.[place] Dec to return call to ED.

## 2021-05-16 NOTE — H&P (Addendum)
History and Physical    [name] ZJI:[id number] DOB: 27-Jun-1961 DOA: 05/16/2021  PCP: Patient, No Pcp Per (Inactive)   Patient coming from: Home.  I have personally briefly reviewed patient's old medical records in [place]  Chief Complaint: Urinary symptoms.  HPI: [name] is a 60 y.o. female with  PMH significant for type 2 diabetes, depression, hypertension, neuropathy presented to the ED with multiple complaints. Patient reports increased urinary frequency, urgency and dysuria for few days.  She feels she has a UTI,  she was prescribed Keflex by her primary care physician.  She has been taking it and reports slight improvement in her symptoms.  Patient reports having hallucinations whenever she has a UTI.  This happened several times in the past,  Patient also reports having jerking movements of upper and lower extremities which has been ongoing for few months.  She denies any fever, chills, sick contact, recent travel, cough, nausea, vomiting, diarrhea.  ED Course: She was hypotensive, other vitals were stable. Temp 98.4, HR 82, RR 18, BP 93/41, SPO2 96% on room air Labs include sodium 136, potassium 5.5, chloride 102, bicarb 26, glucose 188, BUN 72, creatinine 3.08, calcium 8.2, alkaline phosphatase 125, albumin 3.5, lipase 25, AST 13, ALT 14, total protein 6.6, total bilirubin 0.6, lactic acid 1.6, WBC 10.1, hemoglobin 12.2, hematocrit 38.6, platelet 206. Influenza negative, COVID-negative, blood cultures were sent. CT head unremarkable.  Review of Systems:   Review of Systems  Constitutional: Negative.   HENT: Negative.    Eyes: Negative.   Respiratory: Negative.    Cardiovascular: Negative.   Gastrointestinal:  Positive for heartburn.  Genitourinary:  Positive for dysuria, frequency and urgency.  Musculoskeletal: Negative.   Skin: Negative.   Neurological:  Positive for headaches.  Psychiatric/Behavioral:  Positive for depression and  hallucinations. The patient has insomnia.    Past Medical History:  Diagnosis Date   Chest pain    a. 06/2017: cath showing normal cors with a preserved EF of 55-65%.    Depression    Diabetes mellitus    Hypertension    Neuropathy    Pneumonia    Sepsis ([place])     Past Surgical History:  Procedure Laterality Date   ABDOMINAL HYSTERECTOMY     APPENDECTOMY     CESAREAN SECTION     CHOLECYSTECTOMY     LEFT HEART CATH AND CORONARY ANGIOGRAPHY N/A 07/12/2017   Procedure: LEFT HEART CATH AND CORONARY ANGIOGRAPHY;  Surgeon: Lorretta Harp, MD;  Location: [facility];  Service: Cardiovascular;  Laterality: N/A;     reports that she has never smoked. She has never used smokeless tobacco. She reports that she does not drink alcohol and does not use drugs.  Allergies  Allergen Reactions   Ibuprofen Swelling   Codeine Palpitations   Nitrofurantoin Diarrhea and Nausea And Vomiting    Family History  Problem Relation Age of Onset   Diabetes Mother    Heart failure Mother    Hypertension Mother    Cancer Mother    CAD Mother 54       Died of MI   Diabetes Father    Alcohol abuse Father     Family history reviewed and not pertinent .  Prior to Admission medications   Medication Sig Start Date End Date Taking? Authorizing Provider  atorvastatin (LIPITOR) 40 MG tablet Take 1 tablet (40 mg total) by mouth daily at 6 PM. Patient not taking: Reported on 08/08/2017 07/12/17  Cheryln Manly, NP  carbamazepine (CARBATROL) 200 MG 12 hr capsule Take 200 mg by mouth 2 (two) times daily.    [provider]  cephALEXin (KEFLEX) 500 MG capsule Take 1 capsule (500 mg total) by mouth 4 (four) times daily. 08/08/17   Montine Circle, PA-C  chlorhexidine (HIBICLENS) 4 % external liquid Apply topically daily as needed. 08/08/17   Montine Circle, PA-C  DULoxetine (CYMBALTA) 60 MG capsule Take 60 mg by mouth 2 (two) times daily.     [provider]  furosemide (LASIX)  20 MG tablet Take 20 mg by mouth daily.    [provider]  gabapentin (NEURONTIN) 600 MG tablet Take 600 mg by mouth 3 (three) times daily.    [provider]  HYDROcodone-acetaminophen (NORCO/VICODIN) 5-325 MG tablet Take 1-2 tablets by mouth every 6 (six) hours as needed. 08/08/17   Montine Circle, PA-C  insulin aspart (NOVOLOG) 100 UNIT/ML injection Inject 1-30 Units into the skin 2 (two) times daily. Per sliding scale-    [provider]  insulin detemir (LEVEMIR) 100 UNIT/ML injection Inject 60 Units into the skin every morning.    [provider]  lisinopril-hydrochlorothiazide (PRINZIDE,ZESTORETIC) 10-12.5 MG tablet Take 1 tablet by mouth daily.    [provider]    Physical Exam: Vitals:   05/16/21 1845 05/16/21 1900 05/16/21 1930 05/16/21 2000  BP: 116/64 (!) 113/54 (!) 122/105 (!) 98/55  Pulse: 82 83 87 92  Resp: 15 14 19 15   Temp:      TempSrc:      SpO2: 93% 92% 100% 93%  Weight:      Height:        Constitutional: Appears comfortable, not in any acute distress. Vitals:   05/16/21 1845 05/16/21 1900 05/16/21 1930 05/16/21 2000  BP: 116/64 (!) 113/54 (!) 122/105 (!) 98/55  Pulse: 82 83 87 92  Resp: 15 14 19 15   Temp:      TempSrc:      SpO2: 93% 92% 100% 93%  Weight:      Height:       Eyes: PERRL, lids and conjunctivae normal ENMT: Mucous membranes are moist.  Posterior pharynx no exudate..Normal dentition.  Neck: normal, supple, no masses, no thyromegaly Respiratory: Clear to auscultation bilaterally, no wheezes, no crackles, respiratory effort normal. Cardiovascular: Regular rate and rhythm, no murmur, S1-S2 heard. Abdomen: +Abdomen is soft, nontender, nondistended, BS+ Musculoskeletal: No clubbing, no cyanosis.  No joint deformity upper and lower extremities. Good ROM, no contractures. Normal muscle tone.  Skin: no rashes, lesions, ulcers. No induration Neurologic: CN 2-12 grossly intact. Sensation intact, DTR  normal. Strength 5/5 in all 4.  Psychiatric: Normal judgment and insight. Alert and oriented x 3. Normal mood.    Labs on Admission: I have personally reviewed following labs and imaging studies  CBC: Recent Labs  Lab 05/16/21 1559  WBC 10.1  NEUTROABS 7.3  HGB 12.2  HCT 38.6  MCV 98.7  PLT 580   Basic Metabolic Panel: Recent Labs  Lab 05/16/21 1559  NA 136  K 5.5*  CL 102  CO2 26  GLUCOSE 188*  BUN 72*  CREATININE 3.08*  CALCIUM 8.2*   GFR: Estimated Creatinine Clearance: 26.6 mL/min (A) (by C-G formula based on SCr of 3.08 mg/dL (H)). Liver Function Tests: Recent Labs  Lab 05/16/21 1559  AST 13*  ALT 14  ALKPHOS 125  BILITOT 0.6  PROT 6.6  ALBUMIN 3.5   Recent Labs  Lab 05/16/21 1559  LIPASE 25   No results for input(s): AMMONIA in the last 168 hours. Coagulation Profile: No results for input(s): INR, PROTIME in the last 168 hours. Cardiac Enzymes: No results for input(s): CKTOTAL, CKMB, CKMBINDEX, TROPONINI in the last 168 hours. BNP (last 3 results) No results for input(s): PROBNP in the last 8760 hours. HbA1C: No results for input(s): HGBA1C in the last 72 hours. CBG: No results for input(s): GLUCAP in the last 168 hours. Lipid Profile: No results for input(s): CHOL, HDL, LDLCALC, TRIG, CHOLHDL, LDLDIRECT in the last 72 hours. Thyroid Function Tests: No results for input(s): TSH, T4TOTAL, FREET4, T3FREE, THYROIDAB in the last 72 hours. Anemia Panel: No results for input(s): VITAMINB12, FOLATE, FERRITIN, TIBC, IRON, RETICCTPCT in the last 72 hours. Urine analysis:    Component Value Date/Time   COLORURINE YELLOW 08/08/2017 [place] 08/08/2017 1958   LABSPEC 1.020 08/08/2017 1958   PHURINE 5.0 08/08/2017 1958   GLUCOSEU NEGATIVE 08/08/2017 1958   HGBUR NEGATIVE 08/08/2017 1958   BILIRUBINUR NEGATIVE 08/08/2017 1958   KETONESUR NEGATIVE 08/08/2017 1958   PROTEINUR NEGATIVE 08/08/2017 1958   UROBILINOGEN 0.2 01/29/2011  0831   NITRITE NEGATIVE 08/08/2017 1958   LEUKOCYTESUR NEGATIVE 08/08/2017 1958    Radiological Exams on Admission: CT Head Wo Contrast  Result Date: 05/16/2021 CLINICAL DATA:  Mental status change, unknown cause. EXAM: CT HEAD WITHOUT CONTRAST TECHNIQUE: Contiguous axial images were obtained from the base of the skull through the vertex without intravenous contrast. COMPARISON:  Brain MRI 04/28/2010. FINDINGS: Brain: Cerebral volume is normal for age. There is no acute intracranial hemorrhage. No demarcated cortical infarct. No extra-axial fluid collection. No evidence of an intracranial mass. No midline shift. Vascular: No hyperdense vessel. Atherosclerotic calcifications Skull: Normal. Negative for fracture or focal lesion. Sinuses/Orbits: Visualized orbits show no acute finding. Small mucous retention cyst within the left maxillary sinus. IMPRESSION: No evidence of acute intracranial abnormality. Small left maxillary sinus mucous retention cyst. Electronically Signed   By: Kellie Simmering D.O.   On: 05/16/2021 17:45    EKG: Independently reviewed.  Normal sinus rhythm, nonspecific LBBB.  No new changes compared to prior EKG  Assessment/Plan Principal Problem:   AKI (acute kidney injury) ([place]) Active Problems:   Type 2 diabetes mellitus ([facility])   OBESITY   MDD (major depressive disorder)   Migraine headache   Essential hypertension   GERD  AKI on CKD stage IIIA: Baseline creatinine 1.7 few months back. Patient presented with serum creatinine of 3.08, Could be prerenal, could be medication induced patient takes ACEI/HCTZ Continue IV hydration.  Avoid nephrotoxic medications. Monitor renal functions.  Obtain renal ultrasound. Consider nephro consult if renal function does not improve.  Hyperkalemia : Could be secondary to acute kidney injury. Patient received Lokelma, monitor serum potassium  Presumed UTI: Patient was recently started on Keflex. Will continue Keflex to complete  course.  Essential hypertension: Blood pressure is on the soft side. Hold ACEI/HCTZ due to AKI  Hyperlipidemia: Continue atorvastatin  Depression: Continue Cymbalta  Diabetes mellitus: Start regular insulin sliding scale Resume home medications once reconciliation complete.  Hallucinations: This could be sec, to UTI as patient describes. Continue to monitor.  She reports improvement in symptoms.   DVT prophylaxis: Heparin sq Code Status: Full code. Family Communication: No family at bed side. Disposition Plan:   Status is: Inpatient  Remains inpatient appropriate because: Admitted for AKI requiring IV hydration.  Anticipated discharge home in 1 to 2 days once renal functions improved  Consults called: None Admission status: Inpatient   Shawna Clamp MD [facility]   If 7PM-7AM, please contact night-coverage [email or web address] Password [email or web address]  05/16/2021, 9:04 PM

## 2021-05-16 NOTE — ED Triage Notes (Signed)
Pt brought in by [facility] from home with c/o n/v and hallucinations that started this morning. Pt was seen yesterday at Urgent Care and dx with UTI yesterday. Pt also c/o weakness. BP 104/50, HR 74, O2 sat 97% RA for EMS.

## 2021-05-17 ENCOUNTER — Inpatient Hospital Stay (HOSPITAL_COMMUNITY): Payer: MEDICARE

## 2021-05-17 DIAGNOSIS — R350 Frequency of micturition: Secondary | ICD-10-CM | POA: Diagnosis not present

## 2021-05-17 DIAGNOSIS — N179 Acute kidney failure, unspecified: Secondary | ICD-10-CM | POA: Diagnosis not present

## 2021-05-17 LAB — COMPREHENSIVE METABOLIC PANEL
ALT: 14 U/L (ref 0–44)
AST: 11 U/L — ABNORMAL LOW (ref 15–41)
Albumin: 3 g/dL — ABNORMAL LOW (ref 3.5–5.0)
Alkaline Phosphatase: 107 U/L (ref 38–126)
Anion gap: 8 (ref 5–15)
BUN: 67 mg/dL — ABNORMAL HIGH (ref 6–20)
CO2: 26 mmol/L (ref 22–32)
Calcium: 8 mg/dL — ABNORMAL LOW (ref 8.9–10.3)
Chloride: 106 mmol/L (ref 98–111)
Creatinine, Ser: 2.28 mg/dL — ABNORMAL HIGH (ref 0.44–1.00)
GFR, Estimated: 24 mL/min — ABNORMAL LOW (ref >60)
Glucose, Bld: 143 mg/dL — ABNORMAL HIGH (ref 70–99)
Potassium: 4.8 mmol/L (ref 3.5–5.1)
Sodium: 140 mmol/L (ref 135–145)
Total Bilirubin: 0.2 mg/dL — ABNORMAL LOW (ref 0.3–1.2)
Total Protein: 6 g/dL — ABNORMAL LOW (ref 6.5–8.1)

## 2021-05-17 LAB — HEMOGLOBIN A1C
Hgb A1c MFr Bld: 9.8 % — ABNORMAL HIGH (ref 4.8–5.6)
Mean Plasma Glucose: 234.56 mg/dL

## 2021-05-17 LAB — GLUCOSE, CAPILLARY
Glucose-Capillary: 122 mg/dL — ABNORMAL HIGH (ref 70–99)
Glucose-Capillary: 145 mg/dL — ABNORMAL HIGH (ref 70–99)
Glucose-Capillary: 157 mg/dL — ABNORMAL HIGH (ref 70–99)
Glucose-Capillary: 216 mg/dL — ABNORMAL HIGH (ref 70–99)
Glucose-Capillary: 220 mg/dL — ABNORMAL HIGH (ref 70–99)

## 2021-05-17 LAB — CBC
HCT: 34.7 % — ABNORMAL LOW (ref 36.0–46.0)
Hemoglobin: 10.8 g/dL — ABNORMAL LOW (ref 12.0–15.0)
MCH: 30.2 pg (ref 26.0–34.0)
MCHC: 31.1 g/dL (ref 30.0–36.0)
MCV: 96.9 fL (ref 80.0–100.0)
Platelets: 193 10*3/uL (ref 150–400)
RBC: 3.58 MIL/uL — ABNORMAL LOW (ref 3.87–5.11)
RDW: 12.9 % (ref 11.5–15.5)
WBC: 7.9 10*3/uL (ref 4.0–10.5)
nRBC: 0 % (ref 0.0–0.2)

## 2021-05-17 LAB — PHOSPHORUS: Phosphorus: 5.1 mg/dL — ABNORMAL HIGH (ref 2.5–4.6)

## 2021-05-17 LAB — MAGNESIUM: Magnesium: 2.3 mg/dL (ref 1.7–2.4)

## 2021-05-17 LAB — HIV ANTIBODY (ROUTINE TESTING W REFLEX): HIV Screen 4th Generation wRfx: NONREACTIVE

## 2021-05-17 MED ORDER — ESCITALOPRAM OXALATE 10 MG PO TABS
10.0000 mg | ORAL_TABLET | Freq: Every day | ORAL | Status: DC
  Administered 2021-05-17 – 2021-05-18 (×2): 10 mg via ORAL
  Filled 2021-05-17 (×2): qty 1

## 2021-05-17 MED ORDER — HYDROXYZINE HCL 25 MG PO TABS: 50.0000 mg | ORAL_TABLET | Freq: Four times a day (QID) | ORAL | Status: DC | PRN

## 2021-05-17 MED ORDER — TIZANIDINE HCL 4 MG PO TABS: 4.0000 mg | ORAL_TABLET | Freq: Three times a day (TID) | ORAL | Status: DC | PRN

## 2021-05-17 MED ORDER — ENALAPRIL MALEATE 5 MG PO TABS: 10.0000 mg | ORAL_TABLET | Freq: Every day | ORAL | Status: DC

## 2021-05-17 MED ORDER — GABAPENTIN 300 MG PO CAPS
300.0000 mg | ORAL_CAPSULE | Freq: Three times a day (TID) | ORAL | Status: DC
  Administered 2021-05-17 – 2021-05-18 (×5): 300 mg via ORAL
  Filled 2021-05-17 (×5): qty 1

## 2021-05-17 MED ORDER — FUROSEMIDE 40 MG PO TABS: 40.0000 mg | ORAL_TABLET | Freq: Two times a day (BID) | ORAL | Status: DC

## 2021-05-17 MED ORDER — PIOGLITAZONE HCL 15 MG PO TABS
15.0000 mg | ORAL_TABLET | Freq: Every day | ORAL | Status: DC
  Filled 2021-05-17 (×2): qty 1

## 2021-05-17 MED ORDER — PRAZOSIN HCL 1 MG PO CAPS
2.0000 mg | ORAL_CAPSULE | Freq: Every day | ORAL | Status: DC
  Filled 2021-05-17: qty 2

## 2021-05-17 NOTE — Progress Notes (Signed)
Inpatient Diabetes Program Recommendations  AACE/ADA: New Consensus Statement on Inpatient Glycemic Control (2015)  Target Ranges:  Prepandial:   less than 140 mg/dL      Peak postprandial:   less than 180 mg/dL (1-2 hours)      Critically ill patients:  140 - 180 mg/dL   Lab Results  Component Value Date   GLUCAP 145 (H) 05/17/2021   HGBA1C 9.8 (H) 05/16/2021    Review of Glycemic Control Results for MESHELL, ABDULAZIZ (MRN [id number]) as of 05/17/2021 12:25  Ref. Range 05/17/2021 07:26 05/17/2021 11:38 05/17/2021 11:57  Glucose-Capillary Latest Ref Range: 70 - 99 mg/dL 122 (H) 157 (H) 145 (H)   Diabetes history: DM 2 Outpatient Diabetes medications:  Novolog 1-30 units bid, Tresiba 85 units q HS Current orders for Inpatient glycemic control:  Novolog sensitive tid with meals  Inpatient Diabetes Program Recommendations:    Note patient was on high dose of Tresiba at home based on medication reconciliation. Note Tyler Aas has a longer half life and therefore may still be active.  If blood sugars increase>150 mg/dL, may need a portion of SQ basal insulin restarted.    Will follow.   Thanks,  Adah Perl, RN, BC-ADM Inpatient Diabetes Coordinator Pager [phone]  (8a-5p)

## 2021-05-17 NOTE — Progress Notes (Signed)
PROGRESS NOTE    [name]  JOA:[id number] DOB: 29-Oct-1960 DOA: 05/16/2021 PCP: Patient, No Pcp Per (Inactive)   Brief Narrative:  Per HPI: [name] is a 60 y.o. female with  PMH significant for type 2 diabetes, depression, hypertension, neuropathy presented to the ED with multiple complaints. Patient reports increased urinary frequency, urgency and dysuria for few days.  She feels she has a UTI,  she was prescribed Keflex by her primary care physician.  She has been taking it and reports slight improvement in her symptoms.  Patient reports having hallucinations whenever she has a UTI.  This happened several times in the past,  Patient also reports having jerking movements of upper and lower extremities which has been ongoing for few months.  -Patient was admitted for AKI on CKD stage IIIA along with ongoing UTI.  She has ongoing episodes of involuntary jerking and shaking to her extremities which has been progressing over the last year and she is wheelchair-bound at home.  She will require neurology evaluation in the outpatient setting once she gets back to her hometown in [place].  Assessment & Plan:   Principal Problem:   AKI (acute kidney injury) ([place]) Active Problems:   Type 2 diabetes mellitus ([place])   OBESITY   MDD (major depressive disorder)   Migraine headache   Essential hypertension   GERD  AKI on CKD stage IIIA: Baseline creatinine 1.7 few months back. Patient presented with serum creatinine of 3.08 which is improving Could be prerenal, could be medication induced patient takes ACEI/HCTZ Continue IV hydration.  Avoid nephrotoxic medications. Monitor renal functions.  Renal ultrasound pending   Hyperkalemia : Resolved, continue to monitor   Presumed UTI: Patient was recently started on Keflex. Will continue Keflex to complete course.   Essential hypertension: Blood pressure is on the soft side. Hold ACEI/HCTZ due to AKI    Hyperlipidemia: Continue atorvastatin   Depression: Continue Cymbalta   Diabetes mellitus: Continue hold pioglitazone regular insulin sliding scale Resume home medications once reconciliation complete.   Hallucinations: This could be sec, to UTI as patient describes. Continue to monitor.  She reports improvement in symptoms.  Involuntary movements of extremities Recommend outpatient neurology follow-up as this has been progressing over the last 1 year Wheelchair-bound at home, currently visiting from out of town for [employer] wedding  Morbid obesity Lifestyle changes outpatient   DVT prophylaxis: Heparin Code Status: Full Family Communication: None at bedside Disposition Plan:  Status is: Inpatient  Remains inpatient appropriate because: Continues to require IV fluid  Consultants:  None  Procedures:  None  Antimicrobials:  Anti-infectives (From admission, onward)    Start     Dose/Rate Route Frequency Ordered Stop   05/16/21 2200  cephALEXin (KEFLEX) capsule 500 mg        500 mg Oral Every 12 hours 05/16/21 2105        Subjective: Patient seen and evaluated today with no new acute complaints or concerns. No acute concerns or events noted overnight.  She continues to have some episodes of shaking, but this has been ongoing over the last 1 year.  Objective: Vitals:   05/16/21 2326 05/16/21 2327 05/17/21 0332 05/17/21 0843  BP: (!) 118/45  112/61 (!) 109/49  Pulse: 91  90 86  Resp: (!) 21  18 17   Temp: 97.7 F (36.5 C)  97.8 F (36.6 C) 98.3 F (36.8 C)  TempSrc: Oral  Oral Oral  SpO2: 95%  95% 93%  Weight:  Marland Kitchen)  138.6 kg    Height:  5\' 4"  (1.626 m)      Intake/Output Summary (Last 24 hours) at 05/17/2021 0927 Last data filed at 05/17/2021 0400 Gross per 24 hour  Intake 906.25 ml  Output --  Net 906.25 ml   Filed Weights   05/16/21 1340 05/16/21 2327  Weight: 129.3 kg (!) 138.6 kg    Examination:  General exam: Appears calm and comfortable,  morbidly obese Respiratory system: Clear to auscultation. Respiratory effort normal. Cardiovascular system: S1 & S2 heard, RRR.  Gastrointestinal system: Abdomen is soft Central nervous system: Alert and awake Extremities: No edema Skin: No significant lesions noted Psychiatry: Flat affect.    Data Reviewed: I have personally reviewed following labs and imaging studies  CBC: Recent Labs  Lab 05/16/21 1559 05/17/21 0533  WBC 10.1 7.9  NEUTROABS 7.3  --   HGB 12.2 10.8*  HCT 38.6 34.7*  MCV 98.7 96.9  PLT 206 314   Basic Metabolic Panel: Recent Labs  Lab 05/16/21 1559 05/17/21 0533  NA 136 140  K 5.5* 4.8  CL 102 106  CO2 26 26  GLUCOSE 188* 143*  BUN 72* 67*  CREATININE 3.08* 2.28*  CALCIUM 8.2* 8.0*  MG  --  2.3  PHOS  --  5.1*   GFR: Estimated Creatinine Clearance: 37.5 mL/min (A) (by C-G formula based on SCr of 2.28 mg/dL (H)). Liver Function Tests: Recent Labs  Lab 05/16/21 1559 05/17/21 0533  AST 13* 11*  ALT 14 14  ALKPHOS 125 107  BILITOT 0.6 0.2*  PROT 6.6 6.0*  ALBUMIN 3.5 3.0*   Recent Labs  Lab 05/16/21 1559  LIPASE 25   No results for input(s): AMMONIA in the last 168 hours. Coagulation Profile: No results for input(s): INR, PROTIME in the last 168 hours. Cardiac Enzymes: No results for input(s): CKTOTAL, CKMB, CKMBINDEX, TROPONINI in the last 168 hours. BNP (last 3 results) No results for input(s): PROBNP in the last 8760 hours. HbA1C: Recent Labs    05/16/21 1559  HGBA1C 9.8*   CBG: Recent Labs  Lab 05/17/21 0726  GLUCAP 122*   Lipid Profile: No results for input(s): CHOL, HDL, LDLCALC, TRIG, CHOLHDL, LDLDIRECT in the last 72 hours. Thyroid Function Tests: No results for input(s): TSH, T4TOTAL, FREET4, T3FREE, THYROIDAB in the last 72 hours. Anemia Panel: No results for input(s): VITAMINB12, FOLATE, FERRITIN, TIBC, IRON, RETICCTPCT in the last 72 hours. Sepsis Labs: Recent Labs  Lab 05/16/21 1559  LATICACIDVEN 1.6     Recent Results (from the past 240 hour(s))  Blood culture (routine x 2)     Status: None (Preliminary result)   Collection Time: 05/16/21  3:42 PM   Specimen: Right Antecubital; Blood  Result Value Ref Range Status   Specimen Description RIGHT ANTECUBITAL  Final   Special Requests   Final    BOTTLES DRAWN AEROBIC AND ANAEROBIC Blood Culture adequate volume   Culture   Final    NO GROWTH < 24 HOURS Performed at [facility], [address], [place], [place] [address]    Report Status PENDING  Incomplete  Blood culture (routine x 2)     Status: None (Preliminary result)   Collection Time: 05/16/21  3:46 PM   Specimen: BLOOD LEFT HAND  Result Value Ref Range Status   Specimen Description BLOOD LEFT HAND  Final   Special Requests   Final    BOTTLES DRAWN AEROBIC AND ANAEROBIC Blood Culture results may not be optimal due to an  inadequate volume of blood received in culture bottles   Culture   Final    NO GROWTH < 24 HOURS Performed at [facility], [address] [place], [place] [address]    Report Status PENDING  Incomplete  Resp Panel by RT-PCR (Flu A&B, Covid) Nasopharyngeal Swab     Status: None   Collection Time: 05/16/21  7:17 PM   Specimen: Nasopharyngeal Swab; Nasopharyngeal(NP) swabs in vial transport medium  Result Value Ref Range Status   SARS Coronavirus 2 by RT PCR NEGATIVE NEGATIVE Final    Comment: (NOTE) SARS-CoV-2 target nucleic acids are NOT DETECTED.  The SARS-CoV-2 RNA is generally detectable in upper respiratory specimens during the acute phase of infection. The lowest concentration of SARS-CoV-2 viral copies this assay can detect is 138 copies/mL. A negative result does not preclude SARS-Cov-2 infection and should not be used as the sole basis for treatment or other patient management decisions. A negative result may occur with  improper specimen collection/handling, submission of specimen other than nasopharyngeal swab, presence of viral mutation(s)  within the areas targeted by this assay, and inadequate number of viral copies(<138 copies/mL). A negative result must be combined with clinical observations, patient history, and epidemiological information. The expected result is Negative.  Fact Sheet for Patients:  EntrepreneurPulse.com.au  Fact Sheet for Healthcare Providers:  IncredibleEmployment.be  This test is no t yet approved or cleared by the [place] FDA and  has been authorized for detection and/or diagnosis of SARS-CoV-2 by FDA under an Emergency Use Authorization (EUA). This EUA will remain  in effect (meaning this test can be used) for the duration of the COVID-19 declaration under Section 564(b)(1) of the Act, 21 U.S.C.section 360bbb-3(b)(1), unless the authorization is terminated  or revoked sooner.       Influenza A by PCR NEGATIVE NEGATIVE Final   Influenza B by PCR NEGATIVE NEGATIVE Final    Comment: (NOTE) The Xpert Xpress SARS-CoV-2/FLU/RSV plus assay is intended as an aid in the diagnosis of influenza from Nasopharyngeal swab specimens and should not be used as a sole basis for treatment. Nasal washings and aspirates are unacceptable for Xpert Xpress SARS-CoV-2/FLU/RSV testing.  Fact Sheet for Patients: EntrepreneurPulse.com.au  Fact Sheet for Healthcare Providers: IncredibleEmployment.be  This test is not yet approved or cleared by the [place] FDA and has been authorized for detection and/or diagnosis of SARS-CoV-2 by FDA under an Emergency Use Authorization (EUA). This EUA will remain in effect (meaning this test can be used) for the duration of the COVID-19 declaration under Section 564(b)(1) of the Act, 21 U.S.C. section 360bbb-3(b)(1), unless the authorization is terminated or revoked.  Performed at [facility], [address], [place], [place] [address]          Radiology Studies: CT Head Wo  Contrast  Result Date: 05/16/2021 CLINICAL DATA:  Mental status change, unknown cause. EXAM: CT HEAD WITHOUT CONTRAST TECHNIQUE: Contiguous axial images were obtained from the base of the skull through the vertex without intravenous contrast. COMPARISON:  Brain MRI 04/28/2010. FINDINGS: Brain: Cerebral volume is normal for age. There is no acute intracranial hemorrhage. No demarcated cortical infarct. No extra-axial fluid collection. No evidence of an intracranial mass. No midline shift. Vascular: No hyperdense vessel. Atherosclerotic calcifications Skull: Normal. Negative for fracture or focal lesion. Sinuses/Orbits: Visualized orbits show no acute finding. Small mucous retention cyst within the left maxillary sinus. IMPRESSION: No evidence of acute intracranial abnormality. Small left maxillary sinus mucous retention cyst. Electronically Signed   By:  Kellie Simmering D.O.   On: 05/16/2021 17:45        Scheduled Meds:  cephALEXin  500 mg Oral Q12H   docusate sodium  100 mg Oral BID   DULoxetine  60 mg Oral BID   enalapril  10 mg Oral Daily   escitalopram  10 mg Oral Daily   furosemide  40 mg Oral BID   gabapentin  300 mg Oral TID   heparin  5,000 Units Subcutaneous Q8H   insulin aspart  0-9 Units Subcutaneous TID WC   pioglitazone  15 mg Oral Daily   prazosin  2 mg Oral QHS   senna  1 tablet Oral BID   Continuous Infusions:  sodium chloride 125 mL/hr at 05/17/21 0410     LOS: 1 day    Time spent: 35 minutes    Destinee Taber [name] Crocker, DO [facility]  If 7PM-7AM, please contact night-coverage [email or web address] 05/17/2021, 9:27 AM

## 2021-05-18 DIAGNOSIS — E875 Hyperkalemia: Secondary | ICD-10-CM

## 2021-05-18 DIAGNOSIS — R350 Frequency of micturition: Secondary | ICD-10-CM | POA: Diagnosis not present

## 2021-05-18 DIAGNOSIS — I10 Essential (primary) hypertension: Secondary | ICD-10-CM | POA: Diagnosis not present

## 2021-05-18 DIAGNOSIS — N179 Acute kidney failure, unspecified: Secondary | ICD-10-CM | POA: Diagnosis not present

## 2021-05-18 LAB — CBC
HCT: 34.9 % — ABNORMAL LOW (ref 36.0–46.0)
Hemoglobin: 11.5 g/dL — ABNORMAL LOW (ref 12.0–15.0)
MCH: 31.6 pg (ref 26.0–34.0)
MCHC: 33 g/dL (ref 30.0–36.0)
MCV: 95.9 fL (ref 80.0–100.0)
Platelets: 206 10*3/uL (ref 150–400)
RBC: 3.64 MIL/uL — ABNORMAL LOW (ref 3.87–5.11)
RDW: 12.8 % (ref 11.5–15.5)
WBC: 6.2 10*3/uL (ref 4.0–10.5)
nRBC: 0 % (ref 0.0–0.2)

## 2021-05-18 LAB — BASIC METABOLIC PANEL
Anion gap: 6 (ref 5–15)
BUN: 45 mg/dL — ABNORMAL HIGH (ref 6–20)
CO2: 25 mmol/L (ref 22–32)
Calcium: 8.3 mg/dL — ABNORMAL LOW (ref 8.9–10.3)
Chloride: 107 mmol/L (ref 98–111)
Creatinine, Ser: 1.5 mg/dL — ABNORMAL HIGH (ref 0.44–1.00)
GFR, Estimated: 40 mL/min — ABNORMAL LOW (ref >60)
Glucose, Bld: 289 mg/dL — ABNORMAL HIGH (ref 70–99)
Potassium: 5 mmol/L (ref 3.5–5.1)
Sodium: 138 mmol/L (ref 135–145)

## 2021-05-18 LAB — GLUCOSE, CAPILLARY
Glucose-Capillary: 267 mg/dL — ABNORMAL HIGH (ref 70–99)
Glucose-Capillary: 233 mg/dL — ABNORMAL HIGH (ref 70–99)

## 2021-05-18 LAB — URINE CULTURE: Culture: <10000 — AB

## 2021-05-18 LAB — MAGNESIUM: Magnesium: 2.2 mg/dL (ref 1.7–2.4)

## 2021-05-18 MED ORDER — GABAPENTIN 600 MG PO TABS: 300.0000 mg | ORAL_TABLET | Freq: Three times a day (TID) | ORAL | Status: AC

## 2021-05-18 MED ORDER — INSULIN GLARGINE-YFGN 100 UNIT/ML ~~LOC~~ SOLN
20.0000 [IU] | Freq: Every day | SUBCUTANEOUS | Status: DC
  Administered 2021-05-18: 20 [IU] via SUBCUTANEOUS
  Filled 2021-05-18 (×2): qty 0.2

## 2021-05-18 NOTE — Discharge Summary (Signed)
Physician Discharge Summary  [name] NOM:[id number] DOB: [birth year] DOA: 05/16/2021  PCP: Patient, No Pcp Per (Inactive)  Admit date: 05/16/2021 Discharge date: 05/18/2021  Admitted From: Home Disposition: Home  Recommendations for Outpatient Follow-up:  Follow up with PCP in 1-2 weeks Please obtain BMP/CBC in one week Patient plans to follow-up in [place] for further work-up of her involuntary movement of extremities  Discharge Condition: Stable CODE STATUS: Full code Diet recommendation: Heart healthy, carb modified  Brief/Interim Summary: 60 year old female with a history of diabetes, hypertension, neuropathy, presents to the hospital with increased urinary frequency, dysuria as well as hallucinations.  She was found to have urinary tract infection and was noted to have acute kidney injury on chronic kidney disease stage IIIa.  Patient was treated with antibiotics and IV fluids.  Overall hallucinations have resolved.  Renal function is now approaching baseline.  Discharge Diagnoses:  Principal Problem:   AKI (acute kidney injury) ([place]) Active Problems:   Type 2 diabetes mellitus ([place])   OBESITY   MDD (major depressive disorder)   Migraine headache   Essential hypertension   GERD  AKI on CKD stage IIIA: Baseline creatinine 1.7 few months back. Patient presented with serum creatinine of 3.08 which improved to 1.5 with IV fluids Could be prerenal, could be medication induced patient takes ACEI/HCTZ Renal ultrasound without any evidence of hydronephrosis Will continue to hold ACEI/HCTZ and Lasix for now Repeat Fina in 1 to 2 weeks to ensure stability   Hyperkalemia : Resolved   Presumed UTI: Patient was recently started on Keflex. Will continue Keflex to complete course.   Essential hypertension: Blood pressure is on the soft side. Ho continue to hold resume ACEI/HCTZ due to AKI   Hyperlipidemia: Continue atorvastatin   Depression: Continue Cymbalta    Diabetes mellitus: Continue hold pioglitazone, regular insulin sliding scale, Tresiba on discharge   Hallucinations: This could be sec, to UTI as patient describes. Continue to monitor.  She reports improvement in symptoms.   Involuntary movements of extremities Recommend outpatient neurology follow-up as this has been progressing over the last 1 year Wheelchair-bound at home, currently visiting from out of town for nephew's wedding   Morbid obesity  Discharge Instructions  Discharge Instructions     Diet - low sodium heart healthy   Complete by: As directed    Increase activity slowly   Complete by: As directed       Allergies as of 05/18/2021       Reactions   Ibuprofen Swelling   Codeine Palpitations   Nitrofurantoin Diarrhea, Nausea And Vomiting        Medication List     STOP taking these medications    atorvastatin 40 MG tablet Commonly known as: LIPITOR   chlorhexidine 4 % external liquid Commonly known as: Hibiclens   enalapril 10 MG tablet Commonly known as: VASOTEC   furosemide 40 MG tablet Commonly known as: LASIX   HYDROcodone-acetaminophen 5-325 MG tablet Commonly known as: NORCO/VICODIN       TAKE these medications    carbamazepine 200 MG 12 hr capsule Commonly known as: CARBATROL Take 200 mg by mouth 2 (two) times daily.   cephALEXin 500 MG capsule Commonly known as: KEFLEX Take 1 capsule (500 mg total) by mouth 4 (four) times daily. What changed:  how much to take when to take this additional instructions   DULoxetine 60 MG capsule Commonly known as: CYMBALTA Take 60 mg by mouth 2 (two) times daily.   escitalopram 10  MG tablet Commonly known as: LEXAPRO Take 10 mg by mouth daily.   gabapentin 600 MG tablet Commonly known as: NEURONTIN Take 0.5 tablets (300 mg total) by mouth 3 (three) times daily. What changed:  how much to take when to take this   hydrOXYzine 25 MG tablet Commonly known as: ATARAX/VISTARIL Take  50 mg by mouth every 6 (six) hours as needed for itching.   insulin aspart 100 UNIT/ML injection Commonly known as: novoLOG Inject 1-30 Units into the skin 2 (two) times daily. Per sliding scale-   phenazopyridine 200 MG tablet Commonly known as: PYRIDIUM Take 200 mg by mouth 3 (three) times daily as needed for pain.   pioglitazone 15 MG tablet Commonly known as: ACTOS Take 15 mg by mouth daily.   prazosin 2 MG capsule Commonly known as: MINIPRESS Take 2 mg by mouth at bedtime.   tiZANidine 4 MG tablet Commonly known as: ZANAFLEX Take 4 mg by mouth every 8 (eight) hours as needed for muscle spasms.   Tyler Aas FlexTouch 200 UNIT/ML FlexTouch Pen Generic drug: insulin degludec Inject 85 Units into the skin at bedtime.        Follow-up Information     follow up with primary care in 2 weeks and have repeat blood work for kidney function. Restart lasix if you notice swelling in your legs Follow up.                 Allergies  Allergen Reactions   Ibuprofen Swelling   Codeine Palpitations   Nitrofurantoin Diarrhea and Nausea And Vomiting    Consultations:    Procedures/Studies: CT Head Wo Contrast  Result Date: 05/16/2021 CLINICAL DATA:  Mental status change, unknown cause. EXAM: CT HEAD WITHOUT CONTRAST TECHNIQUE: Contiguous axial images were obtained from the base of the skull through the vertex without intravenous contrast. COMPARISON:  Brain MRI 04/28/2010. FINDINGS: Brain: Cerebral volume is normal for age. There is no acute intracranial hemorrhage. No demarcated cortical infarct. No extra-axial fluid collection. No evidence of an intracranial mass. No midline shift. Vascular: No hyperdense vessel. Atherosclerotic calcifications Skull: Normal. Negative for fracture or focal lesion. Sinuses/Orbits: Visualized orbits show no acute finding. Small mucous retention cyst within the left maxillary sinus. IMPRESSION: No evidence of acute intracranial abnormality. Small  left maxillary sinus mucous retention cyst. Electronically Signed   By: Kellie Simmering D.O.   On: 05/16/2021 17:45   US RENAL  Result Date: 05/17/2021 CLINICAL DATA:  Acute kidney injury. EXAM: RENAL / URINARY TRACT ULTRASOUND COMPLETE COMPARISON:  Dec 07, 2009. FINDINGS: Right Kidney: Renal measurements: 12.1 x 6.0 x 5.9 cm = volume: 224 mL. Echogenicity within normal limits. No mass or hydronephrosis visualized. Left Kidney: Renal measurements: 12.0 x 7.2 x 5 1 cm = volume: 227 mL. Echogenicity within normal limits. No mass or hydronephrosis visualized. Bladder: Appears normal for degree of bladder distention. Only left ureteral jet is visualized. Calculated bladder volume of 725 mL. Other: None. IMPRESSION: No renal abnormality seen. Mild to moderate urinary bladder distention is noted. Exam is limited due to body habitus. Electronically Signed   By: Marijo Conception M.D.   On: 05/17/2021 10:00      Subjective: No new complaints, feeling better.  No nausea or vomiting.  Oral intake has been reasonable.  She is making urine.  Discharge Exam: Vitals:   05/17/21 1525 05/17/21 2012 05/18/21 0520 05/18/21 1504  BP: (!) 120/53 (!) 142/44 (!) 157/63 139/67  Pulse: 82 84 78 81  Resp:  18 19 19 18   Temp: 98.4 F (36.9 C) 98.3 F (36.8 C) 98.5 F (36.9 C) 98.4 F (36.9 C)  TempSrc:      SpO2: 97% 95% 95% 95%  Weight:      Height:        General: Pt is alert, awake, not in acute distress Cardiovascular: RRR, S1/S2 +, no rubs, no gallops Respiratory: CTA bilaterally, no wheezing, no rhonchi Abdominal: Soft, NT, ND, bowel sounds + Extremities: no edema, no cyanosis    The results of significant diagnostics from this hospitalization (including imaging, microbiology, ancillary and laboratory) are listed below for reference.     Microbiology: Recent Results (from the past 240 hour(s))  Blood culture (routine x 2)     Status: None (Preliminary result)   Collection Time: 05/16/21  3:42 PM    Specimen: Right Antecubital; Blood  Result Value Ref Range Status   Specimen Description RIGHT ANTECUBITAL  Final   Special Requests   Final    BOTTLES DRAWN AEROBIC AND ANAEROBIC Blood Culture adequate volume   Culture   Final    NO GROWTH 2 DAYS Performed at [facility], [address] [place], [place] [address]    Report Status PENDING  Incomplete  Blood culture (routine x 2)     Status: None (Preliminary result)   Collection Time: 05/16/21  3:46 PM   Specimen: BLOOD LEFT HAND  Result Value Ref Range Status   Specimen Description BLOOD LEFT HAND  Final   Special Requests   Final    BOTTLES DRAWN AEROBIC AND ANAEROBIC Blood Culture results may not be optimal due to an inadequate volume of blood received in culture bottles   Culture   Final    NO GROWTH 2 DAYS Performed at [facility], [address] [place], [place] [address]    Report Status PENDING  Incomplete  Resp Panel by RT-PCR (Flu A&B, Covid) Nasopharyngeal Swab     Status: None   Collection Time: 05/16/21  7:17 PM   Specimen: Nasopharyngeal Swab; Nasopharyngeal(NP) swabs in vial transport medium  Result Value Ref Range Status   SARS Coronavirus 2 by RT PCR NEGATIVE NEGATIVE Final    Comment: (NOTE) SARS-CoV-2 target nucleic acids are NOT DETECTED.  The SARS-CoV-2 RNA is generally detectable in upper respiratory specimens during the acute phase of infection. The lowest concentration of SARS-CoV-2 viral copies this assay can detect is 138 copies/mL. A negative result does not preclude SARS-Cov-2 infection and should not be used as the sole basis for treatment or other patient management decisions. A negative result may occur with  improper specimen collection/handling, submission of specimen other than nasopharyngeal swab, presence of viral mutation(s) within the areas targeted by this assay, and inadequate number of viral copies(<138 copies/mL). A negative result must be combined with clinical observations, patient  history, and epidemiological information. The expected result is Negative.  Fact Sheet for Patients:  EntrepreneurPulse.com.au  Fact Sheet for Healthcare Providers:  IncredibleEmployment.be  This test is no t yet approved or cleared by the [place] FDA and  has been authorized for detection and/or diagnosis of SARS-CoV-2 by FDA under an Emergency Use Authorization (EUA). This EUA will remain  in effect (meaning this test can be used) for the duration of the COVID-19 declaration under Section 564(b)(1) of the Act, 21 U.S.C.section 360bbb-3(b)(1), unless the authorization is terminated  or revoked sooner.       Influenza A by PCR NEGATIVE NEGATIVE Final   Influenza B by  PCR NEGATIVE NEGATIVE Final    Comment: (NOTE) The Xpert Xpress SARS-CoV-2/FLU/RSV plus assay is intended as an aid in the diagnosis of influenza from Nasopharyngeal swab specimens and should not be used as a sole basis for treatment. Nasal washings and aspirates are unacceptable for Xpert Xpress SARS-CoV-2/FLU/RSV testing.  Fact Sheet for Patients: EntrepreneurPulse.com.au  Fact Sheet for Healthcare Providers: IncredibleEmployment.be  This test is not yet approved or cleared by the [place] FDA and has been authorized for detection and/or diagnosis of SARS-CoV-2 by FDA under an Emergency Use Authorization (EUA). This EUA will remain in effect (meaning this test can be used) for the duration of the COVID-19 declaration under Section 564(b)(1) of the Act, 21 U.S.C. section 360bbb-3(b)(1), unless the authorization is terminated or revoked.  Performed at [facility], [address], [place], [place] [address]   Urine Culture     Status: Abnormal   Collection Time: 05/16/21 10:05 PM   Specimen: Urine, Clean Catch  Result Value Ref Range Status   Specimen Description   Final    URINE, CLEAN CATCH Performed at [facility],  [address] [place], [place] [address]    Special Requests   Final    NONE Performed at [facility], [address], [place], [place] [address]    Culture (A)  Final    <10,000 COLONIES/mL INSIGNIFICANT GROWTH Performed at [facility] [address] [place],  [address]    Report Status 05/18/2021 FINAL  Final     Labs: BNP (last 3 results) No results for input(s): BNP in the last 8760 hours. Basic Metabolic Panel: Recent Labs  Lab 05/16/21 1559 05/17/21 0533 05/18/21 0532  NA 136 140 138  K 5.5* 4.8 5.0  CL 102 106 107  CO2 26 26 25   GLUCOSE 188* 143* 289*  BUN 72* 67* 45*  CREATININE 3.08* 2.28* 1.50*  CALCIUM 8.2* 8.0* 8.3*  MG  --  2.3 2.2  PHOS  --  5.1*  --    Liver Function Tests: Recent Labs  Lab 05/16/21 1559 05/17/21 0533  AST 13* 11*  ALT 14 14  ALKPHOS 125 107  BILITOT 0.6 0.2*  PROT 6.6 6.0*  ALBUMIN 3.5 3.0*   Recent Labs  Lab 05/16/21 1559  LIPASE 25   No results for input(s): AMMONIA in the last 168 hours. CBC: Recent Labs  Lab 05/16/21 1559 05/17/21 0533 05/18/21 0532  WBC 10.1 7.9 6.2  NEUTROABS 7.3  --   --   HGB 12.2 10.8* 11.5*  HCT 38.6 34.7* 34.9*  MCV 98.7 96.9 95.9  PLT 206 193 206   Cardiac Enzymes: No results for input(s): CKTOTAL, CKMB, CKMBINDEX, TROPONINI in the last 168 hours. BNP: Invalid input(s): POCBNP CBG: Recent Labs  Lab 05/17/21 1157 05/17/21 1656 05/17/21 2103 05/18/21 0738 05/18/21 1209  GLUCAP 145* 216* 220* 267* 233*   D-Dimer No results for input(s): DDIMER in the last 72 hours. Hgb A1c Recent Labs    05/16/21 1559  HGBA1C 9.8*   Lipid Profile No results for input(s): CHOL, HDL, LDLCALC, TRIG, CHOLHDL, LDLDIRECT in the last 72 hours. Thyroid function studies No results for input(s): TSH, T4TOTAL, T3FREE, THYROIDAB in the last 72 hours.  Invalid input(s): FREET3 Anemia work up No results for input(s): VITAMINB12, FOLATE, FERRITIN, TIBC, IRON, RETICCTPCT in the last 72  hours. Urinalysis    Component Value Date/Time   COLORURINE AMBER (A) 05/16/2021 2205   APPEARANCEUR CLEAR 05/16/2021 2205   LABSPEC 1.020  05/16/2021 2205   PHURINE 5.0 05/16/2021 [place] 05/16/2021 2205   HGBUR NEGATIVE 05/16/2021 2205   BILIRUBINUR NEGATIVE 05/16/2021 [place] 05/16/2021 2205   PROTEINUR 30 (A) 05/16/2021 2205   UROBILINOGEN 0.2 01/29/2011 0831   NITRITE POSITIVE (A) 05/16/2021 2205   LEUKOCYTESUR NEGATIVE 05/16/2021 2205   Sepsis Labs Invalid input(s): PROCALCITONIN,  WBC,  LACTICIDVEN Microbiology Recent Results (from the past 240 hour(s))  Blood culture (routine x 2)     Status: None (Preliminary result)   Collection Time: 05/16/21  3:42 PM   Specimen: Right Antecubital; Blood  Result Value Ref Range Status   Specimen Description RIGHT ANTECUBITAL  Final   Special Requests   Final    BOTTLES DRAWN AEROBIC AND ANAEROBIC Blood Culture adequate volume   Culture   Final    NO GROWTH 2 DAYS Performed at [facility], [address], [place], [place] [address]    Report Status PENDING  Incomplete  Blood culture (routine x 2)     Status: None (Preliminary result)   Collection Time: 05/16/21  3:46 PM   Specimen: BLOOD LEFT HAND  Result Value Ref Range Status   Specimen Description BLOOD LEFT HAND  Final   Special Requests   Final    BOTTLES DRAWN AEROBIC AND ANAEROBIC Blood Culture results may not be optimal due to an inadequate volume of blood received in culture bottles   Culture   Final    NO GROWTH 2 DAYS Performed at [facility], [address] [place], [place] [address]    Report Status PENDING  Incomplete  Resp Panel by RT-PCR (Flu A&B, Covid) Nasopharyngeal Swab     Status: None   Collection Time: 05/16/21  7:17 PM   Specimen: Nasopharyngeal Swab; Nasopharyngeal(NP) swabs in vial transport medium  Result Value Ref Range Status   SARS Coronavirus 2 by RT PCR NEGATIVE NEGATIVE Final    Comment: (NOTE) SARS-CoV-2  target nucleic acids are NOT DETECTED.  The SARS-CoV-2 RNA is generally detectable in upper respiratory specimens during the acute phase of infection. The lowest concentration of SARS-CoV-2 viral copies this assay can detect is 138 copies/mL. A negative result does not preclude SARS-Cov-2 infection and should not be used as the sole basis for treatment or other patient management decisions. A negative result may occur with  improper specimen collection/handling, submission of specimen other than nasopharyngeal swab, presence of viral mutation(s) within the areas targeted by this assay, and inadequate number of viral copies(<138 copies/mL). A negative result must be combined with clinical observations, patient history, and epidemiological information. The expected result is Negative.  Fact Sheet for Patients:  EntrepreneurPulse.com.au  Fact Sheet for Healthcare Providers:  IncredibleEmployment.be  This test is no t yet approved or cleared by the [place] FDA and  has been authorized for detection and/or diagnosis of SARS-CoV-2 by FDA under an Emergency Use Authorization (EUA). This EUA will remain  in effect (meaning this test can be used) for the duration of the COVID-19 declaration under Section 564(b)(1) of the Act, 21 U.S.C.section 360bbb-3(b)(1), unless the authorization is terminated  or revoked sooner.       Influenza A by PCR NEGATIVE NEGATIVE Final   Influenza B by PCR NEGATIVE NEGATIVE Final    Comment: (NOTE) The Xpert Xpress SARS-CoV-2/FLU/RSV plus assay is intended as an aid in the diagnosis of influenza from Nasopharyngeal swab specimens and should not be used as a sole basis for treatment. Nasal washings and  aspirates are unacceptable for Xpert Xpress SARS-CoV-2/FLU/RSV testing.  Fact Sheet for Patients: EntrepreneurPulse.com.au  Fact Sheet for Healthcare  Providers: IncredibleEmployment.be  This test is not yet approved or cleared by the [place] FDA and has been authorized for detection and/or diagnosis of SARS-CoV-2 by FDA under an Emergency Use Authorization (EUA). This EUA will remain in effect (meaning this test can be used) for the duration of the COVID-19 declaration under Section 564(b)(1) of the Act, 21 U.S.C. section 360bbb-3(b)(1), unless the authorization is terminated or revoked.  Performed at [facility], [address] [place], [place] [address]   Urine Culture     Status: Abnormal   Collection Time: 05/16/21 10:05 PM   Specimen: Urine, Clean Catch  Result Value Ref Range Status   Specimen Description   Final    URINE, CLEAN CATCH Performed at [facility], [address], [place], [place] [address]    Special Requests   Final    NONE Performed at [facility], [address], [place], [place] [address]    Culture (A)  Final    <10,000 COLONIES/mL INSIGNIFICANT GROWTH Performed at [facility] [address], [place], [place] [address]    Report Status 05/18/2021 FINAL  Final     Time coordinating discharge: 18mins  SIGNED:   Kathie Dike, MD  [facility] 05/18/2021, 7:02 PM   If 7PM-7AM, please contact night-coverage [email or web address]

## 2021-05-18 NOTE — Progress Notes (Addendum)
Inpatient Diabetes Program Recommendations  AACE/ADA: New Consensus Statement on Inpatient Glycemic Control  Target Ranges:  Prepandial:   less than 140 mg/dL      Peak postprandial:   less than 180 mg/dL (1-2 hours)      Critically ill patients:  140 - 180 mg/dL  Results for [name], PEGUES (MRN [id number]) as of 05/18/2021 10:08  Ref. Range 05/17/2021 07:26 05/17/2021 11:38 05/17/2021 11:57 05/17/2021 16:56 05/17/2021 21:03 05/18/2021 07:38  Glucose-Capillary Latest Ref Range: 70 - 99 mg/dL 122 (H) 157 (H) 145 (H) 216 (H) 220 (H) 267 (H)   Results for [name], JEON (MRN [id number]) as of 05/18/2021 10:08  Ref. Range 05/16/2021 15:59  Hemoglobin A1C Latest Ref Range: 4.8 - 5.6 % 9.8 (H)   Review of Glycemic Control  Diabetes history: DM2 Outpatient Diabetes medications: Tresiba 85 units QHS, Novolog 1-30 units TID with meals (not taking consistently), Actos 30 mg daily Current orders for Inpatient glycemic control: Novolog 0-9 units TID with meals  Inpatient Diabetes Program Recommendations:    Insulin: Please consider ordering Semglee 14 units Q24H (based on 138.6 kg x 0.1 units).   HbgA1C: A1C 9.8% on 05/16/21 indicating an average glucose of 235 mg/dl over the past 2-3 months.   NOTE: Per chart, patient is from out of town (visiting for [employer] wedding) and patient is planning on following up in her hometown with her regular doctors in [place]. Patient admitted with aKI on CKD, hyperkalemia, presumed UTI, and hallucinations. Spoke with patient over the phone about diabetes and home regimen for diabetes control. Patient reports that she is from [place] and is here in [place] visiting for her grandson's wedding. Patient reports that she is still not feeling well today but notes that she is no longer having any hallucinations. Patient reports being followed by PCP for diabetes management but notes that she has an appointment with an Endocrinology group in January 2023 to establish  care and get assistance with DM and weight management.  Patient states she is taking Tresiba 85 units QHS, Actos 30 mg daily, and Novolog per sliding scale TID with meals as an outpatient for diabetes control. Patient reports she is consistently taking [place] and Actos but notes she does not consistently take the Novolog.  Patient reports checking glucose 2-3 times per day and that it is usually in the 200's mg/dl and sometimes goes up to 300's mg/dl. Inquired about prior A1C and patient reports her last A1C was down to 7% range. Discussed A1C results (9.8% on 05/16/21) and explained that current A1C indicates an average glucose of 235 mg/dl over the past 2-3 months. Discussed glucose and A1C goals. Discussed importance of checking CBGs and maintaining good CBG control to prevent long-term and short-term complications. Stressed to the patient the importance of improving glycemic control to prevent further complications from uncontrolled diabetes. Patient states that she is trying to eat better and limiting her intake of sweets and bread. Discussed how the [place] and Novolog insulin works and encouraged patient to take Novolog insulin consistently as prescribed to get DM better controlled. Encouraged patient to keep appointment with Endocrinology group that she has already scheduled for January so they can assist with DM management. Patient states she has all needed DM medications and supplies here in [place] and she has no needs at this time.   Patient verbalized understanding of information discussed and reports no further questions at this time related to diabetes.  Thanks, Barnie Alderman, RN, MSN, CDE Diabetes Coordinator  Inpatient Diabetes Program [phone] (Team Pager from 8am to 5pm)

## 2021-05-18 NOTE — Progress Notes (Signed)
Discharge instructions provided to patient. Patient verbalized understanding. Awaiting ride.

## 2021-05-21 LAB — CULTURE, BLOOD (ROUTINE X 2)
Culture: NO GROWTH
Culture: NO GROWTH
Special Requests: ADEQUATE

## 2021-10-09 ENCOUNTER — Other Ambulatory Visit: Payer: Self-pay

## 2021-10-09 ENCOUNTER — Ambulatory Visit
Admission: EM | Admit: 2021-10-09 | Discharge: 2021-10-09 | Disposition: A | Discharge status: Home / Self Care | Payer: Medicare (Managed Care) | Attending: Urgent Care | Admitting: Urgent Care

## 2021-10-09 ENCOUNTER — Ambulatory Visit (HOSPITAL_COMMUNITY)
Admission: RE | Admit: 2021-10-09 | Discharge: 2021-10-09 | Disposition: A | Discharge status: Home / Self Care | Payer: Medicare (Managed Care) | Source: Ambulatory Visit | Attending: Urgent Care | Admitting: Urgent Care

## 2021-10-09 ENCOUNTER — Encounter: Payer: Self-pay | Admitting: Emergency Medicine

## 2021-10-09 DIAGNOSIS — S92321A Displaced fracture of second metatarsal bone, right foot, initial encounter for closed fracture: Secondary | ICD-10-CM | POA: Insufficient documentation

## 2021-10-09 DIAGNOSIS — E119 Type 2 diabetes mellitus without complications: Secondary | ICD-10-CM | POA: Insufficient documentation

## 2021-10-09 DIAGNOSIS — X58XXXA Exposure to other specified factors, initial encounter: Secondary | ICD-10-CM | POA: Insufficient documentation

## 2021-10-09 DIAGNOSIS — E1169 Type 2 diabetes mellitus with other specified complication: Secondary | ICD-10-CM

## 2021-10-09 DIAGNOSIS — S91301A Unspecified open wound, right foot, initial encounter: Secondary | ICD-10-CM | POA: Diagnosis present

## 2021-10-09 DIAGNOSIS — Z794 Long term (current) use of insulin: Secondary | ICD-10-CM | POA: Diagnosis present

## 2021-10-09 DIAGNOSIS — R300 Dysuria: Secondary | ICD-10-CM

## 2021-10-09 DIAGNOSIS — M19071 Primary osteoarthritis, right ankle and foot: Secondary | ICD-10-CM | POA: Insufficient documentation

## 2021-10-09 DIAGNOSIS — R350 Frequency of micturition: Secondary | ICD-10-CM | POA: Diagnosis present

## 2021-10-09 DIAGNOSIS — M7731 Calcaneal spur, right foot: Secondary | ICD-10-CM | POA: Insufficient documentation

## 2021-10-09 DIAGNOSIS — G9589 Other specified diseases of spinal cord: Secondary | ICD-10-CM | POA: Insufficient documentation

## 2021-10-09 LAB — POCT URINALYSIS DIP (MANUAL ENTRY)
Bilirubin, UA: NEGATIVE
Blood, UA: NEGATIVE
Glucose, UA: NEGATIVE mg/dL
Ketones, POC UA: NEGATIVE mg/dL
Leukocytes, UA: NEGATIVE
Nitrite, UA: NEGATIVE
Protein Ur, POC: NEGATIVE mg/dL
Spec Grav, UA: 1.02 (ref 1.010–1.025)
Urobilinogen, UA: 0.2 E.U./dL
pH, UA: 5.5 (ref 5.0–8.0)

## 2021-10-09 MED ORDER — ACETAMINOPHEN 325 MG PO TABS: 650.0000 mg | ORAL_TABLET | Freq: Four times a day (QID) | ORAL | 0 refills | Status: DC | PRN

## 2021-10-09 NOTE — ED Triage Notes (Signed)
Burning on urination and frequency with pressure x 3 days.  States she also wants her right foot checked.  States she has an area on the bottom of her right foot that is bother her. ?

## 2021-10-09 NOTE — ED Provider Notes (Signed)
?[place] ? ? ?MRN: [id number] DOB: 11/16/1960 ? ?Subjective:  ? ?[name] is a 61 y.o. female presenting for 3-day history of persistent urinary frequency, dysuria, urinary urgency.  Patient is a type II diabetic that is uncontrolled, is on insulin.  She would also like to have her right foot evaluated.  Reports that she has had a callus that popped open in the past 3 weeks.  She had a podiatrist but has not seen them for 2 years.  [place] from [place] town and recently moved here, is trying to establish care.  Does not have sensation in her foot.  No fevers, nausea, vomiting, flank pain, hematuria. ? ?No current facility-administered medications for this encounter. ? ?Current Outpatient Medications:  ?  carbamazepine (CARBATROL) 200 MG 12 hr capsule, Take 200 mg by mouth 2 (two) times daily., Disp: , Rfl:  ?  cephALEXin (KEFLEX) 500 MG capsule, Take 1 capsule (500 mg total) by mouth 4 (four) times daily. (Patient taking differently: Take 1,000 mg by mouth 2 (two) times daily. Starting 10.23.22 x 7 days.), Disp: 28 capsule, Rfl: 0 ?  DULoxetine (CYMBALTA) 60 MG capsule, Take 60 mg by mouth 2 (two) times daily. , Disp: , Rfl:  ?  escitalopram (LEXAPRO) 10 MG tablet, Take 10 mg by mouth daily., Disp: , Rfl:  ?  gabapentin (NEURONTIN) 600 MG tablet, Take 0.5 tablets (300 mg total) by mouth 3 (three) times daily., Disp: , Rfl:  ?  hydrOXYzine (ATARAX/VISTARIL) 25 MG tablet, Take 50 mg by mouth every 6 (six) hours as needed for itching., Disp: , Rfl:  ?  insulin aspart (NOVOLOG) 100 UNIT/ML injection, Inject 1-30 Units into the skin 2 (two) times daily. Per sliding scale-, Disp: , Rfl:  ?  phenazopyridine (PYRIDIUM) 200 MG tablet, Take 200 mg by mouth 3 (three) times daily as needed for pain., Disp: , Rfl:  ?  pioglitazone (ACTOS) 15 MG tablet, Take 15 mg by mouth daily., Disp: , Rfl:  ?  prazosin (MINIPRESS) 2 MG capsule, Take 2 mg by mouth at bedtime., Disp: , Rfl:  ?  tiZANidine  (ZANAFLEX) 4 MG tablet, Take 4 mg by mouth every 8 (eight) hours as needed for muscle spasms., Disp: , Rfl:  ?  TRESIBA FLEXTOUCH 200 UNIT/ML FlexTouch Pen, Inject 85 Units into the skin at bedtime., Disp: , Rfl:   ? ?Allergies  ?Allergen Reactions  ? Ibuprofen Swelling  ? Codeine Palpitations  ? Nitrofurantoin Diarrhea and Nausea And Vomiting  ? ? ?Past Medical History:  ?Diagnosis Date  ? Chest pain   ? a. 06/2017: cath showing normal cors with a preserved EF of 55-65%.   ? Depression   ? Diabetes mellitus   ? Hypertension   ? Neuropathy   ? Pneumonia   ? Sepsis ([place])   ?  ? ?Past Surgical History:  ?Procedure Laterality Date  ? ABDOMINAL HYSTERECTOMY    ? APPENDECTOMY    ? CESAREAN SECTION    ? CHOLECYSTECTOMY    ? LEFT HEART CATH AND CORONARY ANGIOGRAPHY N/A 07/12/2017  ? Procedure: LEFT HEART CATH AND CORONARY ANGIOGRAPHY;  Surgeon: Lorretta Harp, MD;  Location: [facility];  Service: Cardiovascular;  Laterality: N/A;  ? ? ?Family History  ?Problem Relation Age of Onset  ? Diabetes Mother   ? Heart failure Mother   ? Hypertension Mother   ? Cancer Mother   ? CAD Mother 8  ?     Died  of MI  ? Diabetes Father   ? Alcohol abuse Father   ? ? ?Social History  ? ?Tobacco Use  ? Smoking status: Never  ? Smokeless tobacco: Never  ?Substance Use Topics  ? Alcohol use: No  ? Drug use: No  ? ? ?ROS ? ? ?Objective:  ? ?Vitals: ?BP 130/62 (BP Location: Right Arm)   Pulse 75   Temp 98.2 ?F (36.8 ?C) (Oral)   Resp 18   SpO2 96%  ? ?Physical Exam ?Constitutional:   ?   General: She is not in acute distress. ?   Appearance: Normal appearance. She is well-developed. She is not ill-appearing, toxic-appearing or diaphoretic.  ?HENT:  ?   Head: Normocephalic and atraumatic.  ?   Right Ear: External ear normal.  ?   Left Ear: External ear normal.  ?   Nose: Nose normal.  ?   Mouth/Throat:  ?   Mouth: Mucous membranes are moist.  ?Eyes:  ?   General: No scleral icterus.    ?   Right eye: No discharge.     ?   Left  eye: No discharge.  ?   Extraocular Movements: Extraocular movements intact.  ?   Conjunctiva/sclera: Conjunctivae normal.  ?Cardiovascular:  ?   Rate and Rhythm: Normal rate.  ?Pulmonary:  ?   Effort: Pulmonary effort is normal.  ?Abdominal:  ?   General: Bowel sounds are normal. There is no distension.  ?   Palpations: Abdomen is soft. There is no mass.  ?   Tenderness: There is no abdominal tenderness. There is no right CVA tenderness, left CVA tenderness, guarding or rebound.  ?Musculoskeletal:  ?   Right foot: Normal range of motion and normal capillary refill. No swelling, deformity, laceration, tenderness, bony tenderness or crepitus.  ?     Feet: ? ?Skin: ?   General: Skin is warm and dry.  ?Neurological:  ?   General: No focal deficit present.  ?   Mental Status: She is alert and oriented to person, place, and time.  ?Psychiatric:     ?   Mood and Affect: Mood normal.     ?   Behavior: Behavior normal.     ?   Thought Content: Thought content normal.     ?   Judgment: Judgment normal.  ? ? ?Results for orders placed or performed during the hospital encounter of 10/09/21 (from the past 24 hour(s))  ?POCT urinalysis dipstick     Status: None  ? Collection Time: 10/09/21  2:47 PM  ?Result Value Ref Range  ? Color, UA yellow yellow  ? Clarity, UA clear clear  ? Glucose, UA negative negative mg/dL  ? Bilirubin, UA negative negative  ? Ketones, POC UA negative negative mg/dL  ? Spec Grav, UA 1.020 1.010 - 1.025  ? Blood, UA negative negative  ? pH, UA 5.5 5.0 - 8.0  ? Protein Ur, POC negative negative mg/dL  ? Urobilinogen, UA 0.2 0.2 or 1.0 E.U./dL  ? Nitrite, UA Negative Negative  ? Leukocytes, UA Negative Negative  ? ? ?Assessment and Plan :  ? ?PDMP not reviewed this encounter. ? ?1. Wound of right foot   ?2. Urinary frequency   ?3. Dysuria   ?4. Type 2 diabetes mellitus treated with insulin ([place])   ? ?Emphasize better diabetic control for her urinary symptoms.  Low suspicion for yeast infection as she does  not have vaginal symptoms, vaginal discharge.  Regarding her foot, emphasized the  need for establishing care with a podiatrist.  For now we will pursue an x-ray to rule out osteomyelitis.  Patient is presenting to [facility] for an x-ray of her right foot.  Establish care with a new PCP, demonstrated how to pursue this through the [facility] website.  We will follow-up with the x-ray results as soon as they are available.  Tylenol for pain relief as needed. ?  ?Jaynee Eagles, PA-C ?10/09/21 1527 ? ?

## 2021-10-12 LAB — URINE CULTURE: Culture: 10000 — AB

## 2021-10-24 ENCOUNTER — Ambulatory Visit (INDEPENDENT_AMBULATORY_CARE_PROVIDER_SITE_OTHER): Payer: PRIVATE HEALTH INSURANCE

## 2021-10-24 ENCOUNTER — Ambulatory Visit (INDEPENDENT_AMBULATORY_CARE_PROVIDER_SITE_OTHER): Payer: PRIVATE HEALTH INSURANCE | Admitting: Podiatry

## 2021-10-24 DIAGNOSIS — M19072 Primary osteoarthritis, left ankle and foot: Secondary | ICD-10-CM

## 2021-10-24 DIAGNOSIS — L97512 Non-pressure chronic ulcer of other part of right foot with fat layer exposed: Secondary | ICD-10-CM

## 2021-10-24 DIAGNOSIS — S9002XA Contusion of left ankle, initial encounter: Secondary | ICD-10-CM | POA: Diagnosis not present

## 2021-10-24 DIAGNOSIS — M14671 Charcot's joint, right ankle and foot: Secondary | ICD-10-CM | POA: Diagnosis not present

## 2021-10-24 DIAGNOSIS — E0843 Diabetes mellitus due to underlying condition with diabetic autonomic (poly)neuropathy: Secondary | ICD-10-CM | POA: Diagnosis not present

## 2021-10-24 MED ORDER — GENTAMICIN SULFATE 0.1 % EX CREA: 1.0000 "application " | TOPICAL_CREAM | Freq: Two times a day (BID) | CUTANEOUS | 1 refills | Status: DC

## 2021-10-24 NOTE — Progress Notes (Signed)
? ?HPI: 61 y.o. female presenting today for bilateral foot pain.  Patient states that about 3 years ago she sustained an ankle injury to her left ankle.  Since that time she has had 3 surgeries and she continues to have pain and tenderness to her left ankle. ? 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  She has not had the wound evaluated by a physician.  She has been keeping it covered with a Band-Aid.  She presents for further treatment and evaluation ? ?Past Medical History:  ?Diagnosis Date  ? Chest pain   ? a. 06/2017: cath showing normal cors with a preserved EF of 55-65%.   ? Depression   ? Diabetes mellitus   ? Hypertension   ? Neuropathy   ? Pneumonia   ? Sepsis ([place])   ? ? ?Past Surgical History:  ?Procedure Laterality Date  ? ABDOMINAL HYSTERECTOMY    ? APPENDECTOMY    ? CESAREAN SECTION    ? CHOLECYSTECTOMY    ? LEFT HEART CATH AND CORONARY ANGIOGRAPHY N/A 07/12/2017  ? Procedure: LEFT HEART CATH AND CORONARY ANGIOGRAPHY;  Surgeon: Lorretta Harp, MD;  Location: [facility];  Service: Cardiovascular;  Laterality: N/A;  ? ? ?Allergies  ?Allergen Reactions  ? Ibuprofen Swelling  ? Codeine Palpitations  ? Nitrofurantoin Diarrhea and Nausea And Vomiting  ? ?  ? ?Physical Exam: ?General: The patient is alert and oriented x3 in no acute distress. ? ?Dermatology: Skin is warm, dry and supple bilateral lower extremities. Negative for open lesions or macerations.  Incision to the lateral aspect of the left ankle has healed completely.  There is no open wound to the left lower extremity ? ?Ulcer noted to the plantar aspect of the right foot measuring approximately 1.0 x 1.0 x 0.2 cm.  To the noted ulceration there is no eschar.  There is moderate slough fibrin and necrotic tissue noted.  Wound base is red with a granular tissue.  No exposed bone muscle tendon ligament or joint.   Periwound is callused but intact. ? ?Vascular: Palpable pedal pulses bilaterally. Capillary refill within normal limits.  Negative for any significant edema or erythema.  Clinically there is no evidence of vascular compromise ? ?Neurological: Light touch and protective threshold absent ? ?Musculoskeletal Exam: No pedal deformities noted ? ?Radiographic Exam RT foot:  ?Degenerative changes that are noted throughout the midtarsal joint with dorsal dislocation of the TMT joint.  Findings consistent with a chronic degenerative Charcot neuroarthropathy of the right foot.  No acute fractures identified. ? ?Radiographic exam LT ankle: ?Degenerative changes noted to the tibiotalar joint.  Cortical irregularities noted to the distal portion of the ankle and fibula.  No acute fractures identified.  Malalignment of the ankle joint.  There is also some radiolucencies within the osseous structures of the distal portion of the fibula and tibia with evidence of old hardware.  There is no hardware in the ankle currently.  Pes planovalgus deformity noted on lateral view with collapse of medial longitudinal arch. ? ?Assessment: ?1.  Diabetic Charcot neuroarthropathy right foot ?2.  Ulcer right plantar foot ?3. DJD left ankle and foot ? ? ?Plan of Care:  ?1. Patient evaluated. X-Rays reviewed.  ?2.  Medically necessary excisional debridement including subcutaneous tissue was performed using a tissue nipper and 312 scalpel.  Excisional debridement of all the necrotic  nonviable tissue down to healthier bleeding viable tissue was performed with postdebridement measurement same as pre- ?3.  Prescription for gentamicin cream applied daily with a light dressing ?4.  In regards to the patient's Charcot neuroarthropathy and degenerative changes of the left ankle, the patient would like to have it fixed or discuss surgical options.  I recommended the patient be referred to [facility] for that discussion.  Explained to the patient that my  surgical training does not include Charcot reconstructive surgery.  Referral placed for [facility] ?5.  Return to clinic as needed ? ?  ?  ?Edrick Kins, DPM ?[place] ? ?Dr. Edrick Kins, DPM  ?  ?[address]                                        ?[place], [place] [address]                ?Office [phone]  ?Fax [phone] ? ? ? ? ?

## 2021-10-26 DIAGNOSIS — Z794 Long term (current) use of insulin: Secondary | ICD-10-CM | POA: Diagnosis not present

## 2021-10-26 DIAGNOSIS — Z1211 Encounter for screening for malignant neoplasm of colon: Secondary | ICD-10-CM | POA: Diagnosis not present

## 2021-10-26 DIAGNOSIS — G43909 Migraine, unspecified, not intractable, without status migrainosus: Secondary | ICD-10-CM | POA: Diagnosis not present

## 2021-10-26 DIAGNOSIS — E1165 Type 2 diabetes mellitus with hyperglycemia: Secondary | ICD-10-CM | POA: Diagnosis not present

## 2021-10-26 DIAGNOSIS — Z1231 Encounter for screening mammogram for malignant neoplasm of breast: Secondary | ICD-10-CM | POA: Diagnosis not present

## 2021-10-26 DIAGNOSIS — Z7689 Persons encountering health services in other specified circumstances: Secondary | ICD-10-CM | POA: Diagnosis not present

## 2021-10-26 DIAGNOSIS — E1169 Type 2 diabetes mellitus with other specified complication: Secondary | ICD-10-CM | POA: Diagnosis not present

## 2021-11-01 ENCOUNTER — Other Ambulatory Visit: Payer: Self-pay | Admitting: *Deleted

## 2021-11-01 ENCOUNTER — Telehealth: Payer: Self-pay | Admitting: *Deleted

## 2021-11-01 DIAGNOSIS — M19072 Primary osteoarthritis, left ankle and foot: Secondary | ICD-10-CM

## 2021-11-01 NOTE — Telephone Encounter (Signed)
Faxed referral and notes to [facility] per Dr Amalia Hailey last office visit note 11/01/21,confirmation received. ?

## 2021-11-23 ENCOUNTER — Emergency Department (HOSPITAL_COMMUNITY)
Admission: EM | Admit: 2021-11-23 | Discharge: 2021-11-23 | Disposition: A | Discharge status: Home / Self Care | Payer: Medicare HMO | Attending: Emergency Medicine | Admitting: Emergency Medicine

## 2021-11-23 ENCOUNTER — Emergency Department (HOSPITAL_COMMUNITY): Payer: Medicare HMO

## 2021-11-23 ENCOUNTER — Other Ambulatory Visit: Payer: Self-pay

## 2021-11-23 ENCOUNTER — Encounter (HOSPITAL_COMMUNITY): Payer: Self-pay | Admitting: *Deleted

## 2021-11-23 DIAGNOSIS — N189 Chronic kidney disease, unspecified: Secondary | ICD-10-CM | POA: Insufficient documentation

## 2021-11-23 DIAGNOSIS — I129 Hypertensive chronic kidney disease with stage 1 through stage 4 chronic kidney disease, or unspecified chronic kidney disease: Secondary | ICD-10-CM | POA: Diagnosis not present

## 2021-11-23 DIAGNOSIS — M545 Low back pain, unspecified: Secondary | ICD-10-CM | POA: Diagnosis not present

## 2021-11-23 DIAGNOSIS — E1169 Type 2 diabetes mellitus with other specified complication: Secondary | ICD-10-CM

## 2021-11-23 DIAGNOSIS — E119 Type 2 diabetes mellitus without complications: Secondary | ICD-10-CM

## 2021-11-23 DIAGNOSIS — I10 Essential (primary) hypertension: Secondary | ICD-10-CM | POA: Diagnosis present

## 2021-11-23 DIAGNOSIS — R251 Tremor, unspecified: Secondary | ICD-10-CM | POA: Insufficient documentation

## 2021-11-23 DIAGNOSIS — M5459 Other low back pain: Secondary | ICD-10-CM | POA: Diagnosis not present

## 2021-11-23 DIAGNOSIS — E11622 Type 2 diabetes mellitus with other skin ulcer: Secondary | ICD-10-CM | POA: Insufficient documentation

## 2021-11-23 DIAGNOSIS — L97519 Non-pressure chronic ulcer of other part of right foot with unspecified severity: Secondary | ICD-10-CM | POA: Diagnosis not present

## 2021-11-23 DIAGNOSIS — L97419 Non-pressure chronic ulcer of right heel and midfoot with unspecified severity: Secondary | ICD-10-CM | POA: Insufficient documentation

## 2021-11-23 DIAGNOSIS — Z794 Long term (current) use of insulin: Secondary | ICD-10-CM | POA: Insufficient documentation

## 2021-11-23 DIAGNOSIS — Z7984 Long term (current) use of oral hypoglycemic drugs: Secondary | ICD-10-CM | POA: Insufficient documentation

## 2021-11-23 DIAGNOSIS — E860 Dehydration: Secondary | ICD-10-CM | POA: Diagnosis present

## 2021-11-23 DIAGNOSIS — G8929 Other chronic pain: Secondary | ICD-10-CM | POA: Insufficient documentation

## 2021-11-23 DIAGNOSIS — Z79899 Other long term (current) drug therapy: Secondary | ICD-10-CM | POA: Insufficient documentation

## 2021-11-23 DIAGNOSIS — N179 Acute kidney failure, unspecified: Secondary | ICD-10-CM | POA: Diagnosis present

## 2021-11-23 DIAGNOSIS — E11621 Type 2 diabetes mellitus with foot ulcer: Secondary | ICD-10-CM | POA: Diagnosis not present

## 2021-11-23 DIAGNOSIS — I739 Peripheral vascular disease, unspecified: Secondary | ICD-10-CM | POA: Diagnosis not present

## 2021-11-23 DIAGNOSIS — Z87448 Personal history of other diseases of urinary system: Secondary | ICD-10-CM | POA: Diagnosis not present

## 2021-11-23 DIAGNOSIS — M5136 Other intervertebral disc degeneration, lumbar region: Secondary | ICD-10-CM | POA: Diagnosis not present

## 2021-11-23 LAB — URINALYSIS, ROUTINE W REFLEX MICROSCOPIC
Glucose, UA: NEGATIVE mg/dL
Hgb urine dipstick: NEGATIVE
Ketones, ur: NEGATIVE mg/dL
Leukocytes,Ua: NEGATIVE
Nitrite: NEGATIVE
Protein, ur: NEGATIVE mg/dL
Specific Gravity, Urine: 1.021 (ref 1.005–1.030)
pH: 5 (ref 5.0–8.0)

## 2021-11-23 LAB — CBC WITH DIFFERENTIAL/PLATELET
Abs Immature Granulocytes: 0.06 10*3/uL (ref 0.00–0.07)
Basophils Absolute: 0.1 10*3/uL (ref 0.0–0.1)
Basophils Relative: 1 %
Eosinophils Absolute: 0.1 10*3/uL (ref 0.0–0.5)
Eosinophils Relative: 1 %
HCT: 38.7 % (ref 36.0–46.0)
Hemoglobin: 12.4 g/dL (ref 12.0–15.0)
Immature Granulocytes: 1 %
Lymphocytes Relative: 12 %
Lymphs Abs: 1.4 10*3/uL (ref 0.7–4.0)
MCH: 30.3 pg (ref 26.0–34.0)
MCHC: 32 g/dL (ref 30.0–36.0)
MCV: 94.6 fL (ref 80.0–100.0)
Monocytes Absolute: 0.6 10*3/uL (ref 0.1–1.0)
Monocytes Relative: 5 %
Neutro Abs: 9.3 10*3/uL — ABNORMAL HIGH (ref 1.7–7.7)
Neutrophils Relative %: 80 %
Platelets: 159 10*3/uL (ref 150–400)
RBC: 4.09 MIL/uL (ref 3.87–5.11)
RDW: 13.8 % (ref 11.5–15.5)
WBC: 11.5 10*3/uL — ABNORMAL HIGH (ref 4.0–10.5)
nRBC: 0 % (ref 0.0–0.2)

## 2021-11-23 LAB — BASIC METABOLIC PANEL
Anion gap: 9 (ref 5–15)
Anion gap: 9 (ref 5–15)
BUN: 83 mg/dL — ABNORMAL HIGH (ref 6–20)
BUN: 78 mg/dL — ABNORMAL HIGH (ref 6–20)
CO2: 22 mmol/L (ref 22–32)
CO2: 19 mmol/L — ABNORMAL LOW (ref 22–32)
Calcium: 8.3 mg/dL — ABNORMAL LOW (ref 8.9–10.3)
Calcium: 7.7 mg/dL — ABNORMAL LOW (ref 8.9–10.3)
Chloride: 106 mmol/L (ref 98–111)
Chloride: 110 mmol/L (ref 98–111)
Creatinine, Ser: 2.51 mg/dL — ABNORMAL HIGH (ref 0.44–1.00)
Creatinine, Ser: 2.27 mg/dL — ABNORMAL HIGH (ref 0.44–1.00)
GFR, Estimated: 22 mL/min — ABNORMAL LOW (ref >60)
GFR, Estimated: 24 mL/min — ABNORMAL LOW (ref >60)
Glucose, Bld: 88 mg/dL (ref 70–99)
Glucose, Bld: 64 mg/dL — ABNORMAL LOW (ref 70–99)
Potassium: 4.8 mmol/L (ref 3.5–5.1)
Potassium: 3.9 mmol/L (ref 3.5–5.1)
Sodium: 137 mmol/L (ref 135–145)
Sodium: 138 mmol/L (ref 135–145)

## 2021-11-23 LAB — CBG MONITORING, ED
Glucose-Capillary: 86 mg/dL (ref 70–99)
Glucose-Capillary: 73 mg/dL (ref 70–99)

## 2021-11-23 MED ORDER — FUROSEMIDE 40 MG PO TABS: 40.0000 mg | ORAL_TABLET | Freq: Every day | ORAL | 11 refills | Status: DC

## 2021-11-23 MED ORDER — SODIUM CHLORIDE 0.9 % IV BOLUS
1000.0000 mL | Freq: Once | INTRAVENOUS | Status: AC
Start: 2021-11-23 — End: 2021-11-23
  Administered 2021-11-23: 1000 mL via INTRAVENOUS

## 2021-11-23 MED ORDER — TRAMADOL HCL 50 MG PO TABS: 50.0000 mg | ORAL_TABLET | Freq: Three times a day (TID) | ORAL | 0 refills | Status: DC | PRN

## 2021-11-23 MED ORDER — SODIUM CHLORIDE 0.9 % IV BOLUS
1000.0000 mL | Freq: Once | INTRAVENOUS | Status: AC
  Administered 2021-11-23: 1000 mL via INTRAVENOUS

## 2021-11-23 MED ORDER — HYDROCODONE-ACETAMINOPHEN 5-325 MG PO TABS
1.0000 | ORAL_TABLET | Freq: Once | ORAL | Status: AC
  Administered 2021-11-23: 1 via ORAL
  Filled 2021-11-23: qty 1

## 2021-11-23 MED ORDER — SODIUM CHLORIDE 0.9 % IV SOLN: INTRAVENOUS | Status: DC
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
## 2021-11-23 NOTE — ED Triage Notes (Signed)
Pt c/o lower back pain; pt c/o tremors; both started last night; pt has stage 3 kidney disease; pt denies any urinary sx ?

## 2021-11-23 NOTE — ED Notes (Signed)
Pt up to bedside commode with assist. Pt unsteady and shaky on feet, states she feels a little dizzy.  ? ?Pt able to urinate about 250cc  ?
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
## 2021-11-23 NOTE — ED Notes (Signed)
Pt provided juice and cracker for low blood sugar.  ? ?

## 2021-11-23 NOTE — Discharge Instructions (Addendum)
1)Hold Lasix/Furosemide for now, Restart on Friday 11/25/2021 at 40 mg --1 tablet daily ?2)Hold Enalapril until you see your primary care physician to review your medications and recheck your kidney test (BMP Blood test) ?3)Repeat BMP blood test to check your kidney numbers in about 5 to 7 days from now ?4)Please keep your appointment with Dr. Sharion Dove Deorio --orthopedic surgeon who is a foot and ankle specialist with [facility] on Thursday, 11/24/2021 at 9 AM ?5)Avoid ibuprofen/Advil/Aleve/Motrin/Goody Powders/Naproxen/BC powders/Meloxicam/Diclofenac/Indomethacin and other Nonsteroidal anti-inflammatory medications as these will make you more likely to bleed and can cause stomach ulcers, can also cause Kidney problems.  ? ?

## 2021-11-23 NOTE — ED Notes (Signed)
Pt's fluids reconnected.  ?

## 2021-11-24 DIAGNOSIS — M25572 Pain in left ankle and joints of left foot: Secondary | ICD-10-CM | POA: Diagnosis not present

## 2021-11-24 DIAGNOSIS — M19071 Primary osteoarthritis, right ankle and foot: Secondary | ICD-10-CM | POA: Diagnosis not present

## 2021-11-24 DIAGNOSIS — Q666 Other congenital valgus deformities of feet: Secondary | ICD-10-CM | POA: Diagnosis not present

## 2021-11-24 DIAGNOSIS — M21072 Valgus deformity, not elsewhere classified, left ankle: Secondary | ICD-10-CM | POA: Diagnosis not present

## 2021-11-24 DIAGNOSIS — M1460 Charcot's joint, unspecified site: Secondary | ICD-10-CM | POA: Diagnosis not present

## 2021-11-24 DIAGNOSIS — G8929 Other chronic pain: Secondary | ICD-10-CM | POA: Diagnosis not present

## 2021-11-24 DIAGNOSIS — M14671 Charcot's joint, right ankle and foot: Secondary | ICD-10-CM | POA: Diagnosis not present

## 2021-11-24 DIAGNOSIS — M12572 Traumatic arthropathy, left ankle and foot: Secondary | ICD-10-CM | POA: Diagnosis not present

## 2021-12-21 ENCOUNTER — Emergency Department (HOSPITAL_COMMUNITY): Payer: Medicare HMO

## 2021-12-21 ENCOUNTER — Encounter (HOSPITAL_COMMUNITY): Payer: Self-pay

## 2021-12-21 ENCOUNTER — Other Ambulatory Visit: Payer: Self-pay

## 2021-12-21 ENCOUNTER — Inpatient Hospital Stay (HOSPITAL_COMMUNITY)
Admission: EM | Admit: 2021-12-21 | Discharge: 2021-12-24 | DRG: 552 | Disposition: A | Discharge status: Home Health | Payer: Medicare HMO | Attending: Family Medicine | Admitting: Family Medicine

## 2021-12-21 DIAGNOSIS — I129 Hypertensive chronic kidney disease with stage 1 through stage 4 chronic kidney disease, or unspecified chronic kidney disease: Secondary | ICD-10-CM | POA: Diagnosis not present

## 2021-12-21 DIAGNOSIS — E1122 Type 2 diabetes mellitus with diabetic chronic kidney disease: Secondary | ICD-10-CM | POA: Diagnosis not present

## 2021-12-21 DIAGNOSIS — M5104 Intervertebral disc disorders with myelopathy, thoracic region: Secondary | ICD-10-CM | POA: Diagnosis present

## 2021-12-21 DIAGNOSIS — E161 Other hypoglycemia: Secondary | ICD-10-CM | POA: Diagnosis not present

## 2021-12-21 DIAGNOSIS — M5106 Intervertebral disc disorders with myelopathy, lumbar region: Principal | ICD-10-CM | POA: Diagnosis present

## 2021-12-21 DIAGNOSIS — N289 Disorder of kidney and ureter, unspecified: Secondary | ICD-10-CM | POA: Diagnosis not present

## 2021-12-21 DIAGNOSIS — Z811 Family history of alcohol abuse and dependence: Secondary | ICD-10-CM | POA: Diagnosis not present

## 2021-12-21 DIAGNOSIS — N1831 Chronic kidney disease, stage 3a: Secondary | ICD-10-CM | POA: Diagnosis not present

## 2021-12-21 DIAGNOSIS — I10 Essential (primary) hypertension: Secondary | ICD-10-CM | POA: Diagnosis not present

## 2021-12-21 DIAGNOSIS — R262 Difficulty in walking, not elsewhere classified: Secondary | ICD-10-CM | POA: Diagnosis not present

## 2021-12-21 DIAGNOSIS — I959 Hypotension, unspecified: Secondary | ICD-10-CM | POA: Diagnosis not present

## 2021-12-21 DIAGNOSIS — M545 Low back pain, unspecified: Principal | ICD-10-CM

## 2021-12-21 DIAGNOSIS — E278 Other specified disorders of adrenal gland: Secondary | ICD-10-CM

## 2021-12-21 DIAGNOSIS — E119 Type 2 diabetes mellitus without complications: Secondary | ICD-10-CM

## 2021-12-21 DIAGNOSIS — F32A Depression, unspecified: Secondary | ICD-10-CM | POA: Diagnosis not present

## 2021-12-21 DIAGNOSIS — D3502 Benign neoplasm of left adrenal gland: Secondary | ICD-10-CM | POA: Diagnosis not present

## 2021-12-21 DIAGNOSIS — Z91041 Radiographic dye allergy status: Secondary | ICD-10-CM

## 2021-12-21 DIAGNOSIS — G629 Polyneuropathy, unspecified: Secondary | ICD-10-CM

## 2021-12-21 DIAGNOSIS — M48061 Spinal stenosis, lumbar region without neurogenic claudication: Secondary | ICD-10-CM | POA: Diagnosis present

## 2021-12-21 DIAGNOSIS — E1140 Type 2 diabetes mellitus with diabetic neuropathy, unspecified: Secondary | ICD-10-CM | POA: Diagnosis present

## 2021-12-21 DIAGNOSIS — Z794 Long term (current) use of insulin: Secondary | ICD-10-CM

## 2021-12-21 DIAGNOSIS — D3501 Benign neoplasm of right adrenal gland: Secondary | ICD-10-CM | POA: Diagnosis not present

## 2021-12-21 DIAGNOSIS — L97519 Non-pressure chronic ulcer of other part of right foot with unspecified severity: Secondary | ICD-10-CM | POA: Diagnosis not present

## 2021-12-21 DIAGNOSIS — Z885 Allergy status to narcotic agent status: Secondary | ICD-10-CM | POA: Diagnosis not present

## 2021-12-21 DIAGNOSIS — Z833 Family history of diabetes mellitus: Secondary | ICD-10-CM | POA: Diagnosis not present

## 2021-12-21 DIAGNOSIS — R269 Unspecified abnormalities of gait and mobility: Secondary | ICD-10-CM | POA: Diagnosis present

## 2021-12-21 DIAGNOSIS — M5116 Intervertebral disc disorders with radiculopathy, lumbar region: Secondary | ICD-10-CM | POA: Diagnosis present

## 2021-12-21 DIAGNOSIS — Z6841 Body Mass Index (BMI) 40.0 and over, adult: Secondary | ICD-10-CM | POA: Diagnosis not present

## 2021-12-21 DIAGNOSIS — R29898 Other symptoms and signs involving the musculoskeletal system: Secondary | ICD-10-CM | POA: Diagnosis not present

## 2021-12-21 DIAGNOSIS — R52 Pain, unspecified: Secondary | ICD-10-CM | POA: Diagnosis not present

## 2021-12-21 DIAGNOSIS — R531 Weakness: Secondary | ICD-10-CM | POA: Diagnosis present

## 2021-12-21 DIAGNOSIS — E11621 Type 2 diabetes mellitus with foot ulcer: Secondary | ICD-10-CM | POA: Diagnosis present

## 2021-12-21 DIAGNOSIS — Z886 Allergy status to analgesic agent status: Secondary | ICD-10-CM | POA: Diagnosis not present

## 2021-12-21 DIAGNOSIS — Z79899 Other long term (current) drug therapy: Secondary | ICD-10-CM

## 2021-12-21 DIAGNOSIS — Z8249 Family history of ischemic heart disease and other diseases of the circulatory system: Secondary | ICD-10-CM

## 2021-12-21 DIAGNOSIS — Z993 Dependence on wheelchair: Secondary | ICD-10-CM | POA: Diagnosis not present

## 2021-12-21 DIAGNOSIS — M4800 Spinal stenosis, site unspecified: Secondary | ICD-10-CM

## 2021-12-21 DIAGNOSIS — M4316 Spondylolisthesis, lumbar region: Secondary | ICD-10-CM | POA: Diagnosis not present

## 2021-12-21 DIAGNOSIS — N184 Chronic kidney disease, stage 4 (severe): Secondary | ICD-10-CM

## 2021-12-21 DIAGNOSIS — M549 Dorsalgia, unspecified: Secondary | ICD-10-CM | POA: Diagnosis not present

## 2021-12-21 DIAGNOSIS — E162 Hypoglycemia, unspecified: Secondary | ICD-10-CM | POA: Diagnosis not present

## 2021-12-21 DIAGNOSIS — M7989 Other specified soft tissue disorders: Secondary | ICD-10-CM | POA: Diagnosis not present

## 2021-12-21 MED ORDER — HYDROMORPHONE HCL 1 MG/ML IJ SOLN
1.0000 mg | Freq: Once | INTRAMUSCULAR | Status: AC
  Administered 2021-12-21: 1 mg via INTRAVENOUS
  Filled 2021-12-21: qty 1

## 2021-12-21 NOTE — ED Triage Notes (Signed)
[facility] from home. Cc of neuropathy  esp in her right hip and leg.  Takes gabapentin

## 2021-12-21 NOTE — ED Provider Notes (Incomplete)
[facility] Emergency Department Provider Note MRN:  [id number]  Arrival date & time: 12/22/21     Chief Complaint   Pain   History of Present Illness   [name] is a 61 y.o. year-old female with a history of diabetes, neuropathy presenting to the ED with chief complaint of flank pain.  Pain to the right flank for 2 or 3 days, radiation down the right leg.  Becoming more severe.  Also endorsing decreased strength to the left leg.  Has chronic decreased sensation to the legs due to neuropathy.  Denies fever, no trouble with bowel or bladder function.  Review of Systems  A thorough review of systems was obtained and all systems are negative except as noted in the HPI and PMH.   Patient's Health History    Past Medical History:  Diagnosis Date   Chest pain    a. 06/2017: cath showing normal cors with a preserved EF of 55-65%.    Depression    Diabetes mellitus    Hypertension    Neuropathy    Pneumonia    Sepsis ([place])     Past Surgical History:  Procedure Laterality Date   ABDOMINAL HYSTERECTOMY     APPENDECTOMY     CESAREAN SECTION     CHOLECYSTECTOMY     LEFT HEART CATH AND CORONARY ANGIOGRAPHY N/A 07/12/2017   Procedure: LEFT HEART CATH AND CORONARY ANGIOGRAPHY;  Surgeon: Lorretta Harp, MD;  Location: [facility];  Service: Cardiovascular;  Laterality: N/A;    Family History  Problem Relation Age of Onset   Diabetes Mother    Heart failure Mother    Hypertension Mother    Cancer Mother    CAD Mother 71       Died of MI   Diabetes Father    Alcohol abuse Father     Social History   Socioeconomic History   Marital status: Single    Spouse name: Not on file   Number of children: Not on file   Years of education: Not on file   Highest education level: Not on file  Occupational History   Occupation: Disability  Tobacco Use   Smoking status: Never   Smokeless tobacco: Never  Substance and Sexual Activity    Alcohol use: No   Drug use: No   Sexual activity: Yes    Birth control/protection: None  Other Topics Concern   Not on file  Social History Narrative   Not on file   Social Determinants of Health   Financial Resource Strain: Not on file  Food Insecurity: Not on file  Transportation Needs: Not on file  Physical Activity: Not on file  Stress: Not on file  Social Connections: Not on file  Intimate Partner Violence: Not on file     Physical Exam   Vitals:   12/22/21 0345 12/22/21 0500  BP: (!) 121/50 105/67  Pulse: 86 84  Resp: 18 17  Temp:    SpO2: 96% 95%    CONSTITUTIONAL: Chronically ill-appearing, NAD NEURO/PSYCH:  Alert and oriented x 3, decreased strength to the left lower extremity EYES:  eyes equal and reactive ENT/NECK:  no LAD, no JVD CARDIO: Regular rate, well-perfused, normal S1 and S2 PULM:  CTAB no wheezing or rhonchi GI/GU:  non-distended, non-tender MSK/SPINE:  No gross deformities, no edema SKIN:  no rash, atraumatic   *Additional and/or pertinent findings included in MDM below  Diagnostic and Interventional Summary    EKG  Interpretation  Date/Time:    Ventricular Rate:    PR Interval:    QRS Duration:   QT Interval:    QTC Calculation:   R Axis:     Text Interpretation:         Labs Reviewed  COMPREHENSIVE METABOLIC PANEL - Abnormal; Notable for the following components:      Result Value   Glucose, Bld 67 (*)    BUN 56 (*)    Creatinine, Ser 2.04 (*)    Calcium 8.3 (*)    Albumin 3.3 (*)    GFR, Estimated 28 (*)    All other components within normal limits  URINALYSIS, ROUTINE W REFLEX MICROSCOPIC - Abnormal; Notable for the following components:   Hgb urine dipstick SMALL (*)    Bacteria, UA RARE (*)    All other components within normal limits  CBC    DG Foot Complete Right  Final Result    CT ABDOMEN PELVIS WO CONTRAST  Final Result    CT L-SPINE NO CHARGE  Final Result    MR LUMBAR SPINE WO CONTRAST    (Results  Pending)    Medications  HYDROmorphone (DILAUDID) injection 1 mg (1 mg Intravenous Given 12/21/21 2355)  HYDROmorphone (DILAUDID) injection 1 mg (1 mg Intravenous Given 12/22/21 0356)     Procedures  /  Critical Care Procedures  ED Course and Medical Decision Making  Initial Impression and Ddx Differential diagnosis includes neuropathy related pain, kidney stone, radiculopathy, myelopathy.  Will check labs, urinalysis, CT imaging of the abdomen, pelvis, lumbar spine.  May need MRI if continues to exhibit this weakness/progressive neurological deficit.  Past medical/surgical history that increases complexity of ED encounter: History of neuropathy, diabetes  Interpretation of Diagnostics I personally reviewed the laboratory assessment and my interpretation is as follows: No significant blood count or electrolyte disturbance  CT imaging is without intra-abdominal abnormality.  Extensive abnormalities of the lumbar spine with significant stenosis.  Given this finding in the setting of patient's symptoms, there is raised concern for myelopathy.  Patient Reassessment and Ultimate Disposition/Management     Awaiting MRI, question myelopathy versus radiculopathy.  Will need reassessment of symptoms and ambulatory status to determine disposition.  Signed out to oncoming provider at shift change.  Patient management required discussion with the following services or consulting groups:  None  Complexity of Problems Addressed Acute illness or injury that poses threat of life of bodily function  Additional Data Reviewed and Analyzed Further history obtained from: Further history from spouse/family member  Additional Factors Impacting ED Encounter Risk Use of parenteral controlled substances and Consideration of hospitalization  Barth Kirks. Sedonia Small, MD [place] [email or web address]  Final Clinical Impressions(s) / ED Diagnoses     ICD-10-CM    1. Acute right-sided low back pain, unspecified whether sciatica present  M54.50     2. Left leg weakness  R29.898       ED Discharge Orders     None        Discharge Instructions Discussed with and Provided to Patient:   Discharge Instructions   None      Maudie Flakes, MD 12/22/21 3299    Maudie Flakes, MD 12/22/21 [id number]

## 2021-12-22 ENCOUNTER — Emergency Department (HOSPITAL_COMMUNITY): Payer: Medicare HMO

## 2021-12-22 ENCOUNTER — Inpatient Hospital Stay (HOSPITAL_COMMUNITY): Payer: Medicare HMO

## 2021-12-22 DIAGNOSIS — R29898 Other symptoms and signs involving the musculoskeletal system: Secondary | ICD-10-CM | POA: Diagnosis present

## 2021-12-22 DIAGNOSIS — E11621 Type 2 diabetes mellitus with foot ulcer: Secondary | ICD-10-CM | POA: Diagnosis present

## 2021-12-22 DIAGNOSIS — Z811 Family history of alcohol abuse and dependence: Secondary | ICD-10-CM | POA: Diagnosis not present

## 2021-12-22 DIAGNOSIS — G629 Polyneuropathy, unspecified: Secondary | ICD-10-CM

## 2021-12-22 DIAGNOSIS — N1831 Chronic kidney disease, stage 3a: Secondary | ICD-10-CM | POA: Diagnosis not present

## 2021-12-22 DIAGNOSIS — N184 Chronic kidney disease, stage 4 (severe): Secondary | ICD-10-CM

## 2021-12-22 DIAGNOSIS — Z8249 Family history of ischemic heart disease and other diseases of the circulatory system: Secondary | ICD-10-CM | POA: Diagnosis not present

## 2021-12-22 DIAGNOSIS — D3502 Benign neoplasm of left adrenal gland: Secondary | ICD-10-CM | POA: Diagnosis not present

## 2021-12-22 DIAGNOSIS — Z79899 Other long term (current) drug therapy: Secondary | ICD-10-CM | POA: Diagnosis not present

## 2021-12-22 DIAGNOSIS — L97519 Non-pressure chronic ulcer of other part of right foot with unspecified severity: Secondary | ICD-10-CM | POA: Diagnosis not present

## 2021-12-22 DIAGNOSIS — Z6841 Body Mass Index (BMI) 40.0 and over, adult: Secondary | ICD-10-CM | POA: Diagnosis not present

## 2021-12-22 DIAGNOSIS — N289 Disorder of kidney and ureter, unspecified: Secondary | ICD-10-CM

## 2021-12-22 DIAGNOSIS — R531 Weakness: Secondary | ICD-10-CM | POA: Diagnosis present

## 2021-12-22 DIAGNOSIS — Z885 Allergy status to narcotic agent status: Secondary | ICD-10-CM | POA: Diagnosis not present

## 2021-12-22 DIAGNOSIS — M4316 Spondylolisthesis, lumbar region: Secondary | ICD-10-CM | POA: Diagnosis not present

## 2021-12-22 DIAGNOSIS — F32A Depression, unspecified: Secondary | ICD-10-CM | POA: Diagnosis not present

## 2021-12-22 DIAGNOSIS — I10 Essential (primary) hypertension: Secondary | ICD-10-CM | POA: Diagnosis not present

## 2021-12-22 DIAGNOSIS — E278 Other specified disorders of adrenal gland: Secondary | ICD-10-CM

## 2021-12-22 DIAGNOSIS — Z886 Allergy status to analgesic agent status: Secondary | ICD-10-CM | POA: Diagnosis not present

## 2021-12-22 DIAGNOSIS — M5104 Intervertebral disc disorders with myelopathy, thoracic region: Secondary | ICD-10-CM | POA: Diagnosis present

## 2021-12-22 DIAGNOSIS — M5116 Intervertebral disc disorders with radiculopathy, lumbar region: Secondary | ICD-10-CM | POA: Diagnosis present

## 2021-12-22 DIAGNOSIS — R262 Difficulty in walking, not elsewhere classified: Secondary | ICD-10-CM | POA: Diagnosis not present

## 2021-12-22 DIAGNOSIS — E1122 Type 2 diabetes mellitus with diabetic chronic kidney disease: Secondary | ICD-10-CM | POA: Diagnosis present

## 2021-12-22 DIAGNOSIS — M5106 Intervertebral disc disorders with myelopathy, lumbar region: Secondary | ICD-10-CM | POA: Diagnosis not present

## 2021-12-22 DIAGNOSIS — Z794 Long term (current) use of insulin: Secondary | ICD-10-CM | POA: Diagnosis not present

## 2021-12-22 DIAGNOSIS — D3501 Benign neoplasm of right adrenal gland: Secondary | ICD-10-CM | POA: Diagnosis not present

## 2021-12-22 DIAGNOSIS — Z91041 Radiographic dye allergy status: Secondary | ICD-10-CM | POA: Diagnosis not present

## 2021-12-22 DIAGNOSIS — R269 Unspecified abnormalities of gait and mobility: Secondary | ICD-10-CM | POA: Diagnosis present

## 2021-12-22 DIAGNOSIS — I129 Hypertensive chronic kidney disease with stage 1 through stage 4 chronic kidney disease, or unspecified chronic kidney disease: Secondary | ICD-10-CM | POA: Diagnosis present

## 2021-12-22 DIAGNOSIS — M7989 Other specified soft tissue disorders: Secondary | ICD-10-CM | POA: Diagnosis not present

## 2021-12-22 DIAGNOSIS — Z833 Family history of diabetes mellitus: Secondary | ICD-10-CM | POA: Diagnosis not present

## 2021-12-22 DIAGNOSIS — E1140 Type 2 diabetes mellitus with diabetic neuropathy, unspecified: Secondary | ICD-10-CM | POA: Diagnosis not present

## 2021-12-22 DIAGNOSIS — M48061 Spinal stenosis, lumbar region without neurogenic claudication: Secondary | ICD-10-CM | POA: Diagnosis present

## 2021-12-22 DIAGNOSIS — M545 Low back pain, unspecified: Secondary | ICD-10-CM | POA: Diagnosis not present

## 2021-12-22 DIAGNOSIS — Z993 Dependence on wheelchair: Secondary | ICD-10-CM | POA: Diagnosis not present

## 2021-12-22 LAB — HEMOGLOBIN A1C
Hgb A1c MFr Bld: 5.9 % — ABNORMAL HIGH (ref 4.8–5.6)
Mean Plasma Glucose: 122.63 mg/dL

## 2021-12-22 LAB — COMPREHENSIVE METABOLIC PANEL
ALT: 15 U/L (ref 0–44)
AST: 21 U/L (ref 15–41)
Albumin: 3.3 g/dL — ABNORMAL LOW (ref 3.5–5.0)
Alkaline Phosphatase: 123 U/L (ref 38–126)
Anion gap: 7 (ref 5–15)
BUN: 56 mg/dL — ABNORMAL HIGH (ref 6–20)
CO2: 26 mmol/L (ref 22–32)
Calcium: 8.3 mg/dL — ABNORMAL LOW (ref 8.9–10.3)
Chloride: 104 mmol/L (ref 98–111)
Creatinine, Ser: 2.04 mg/dL — ABNORMAL HIGH (ref 0.44–1.00)
GFR, Estimated: 28 mL/min — ABNORMAL LOW (ref >60)
Glucose, Bld: 67 mg/dL — ABNORMAL LOW (ref 70–99)
Potassium: 3.8 mmol/L (ref 3.5–5.1)
Sodium: 137 mmol/L (ref 135–145)
Total Bilirubin: 1.2 mg/dL (ref 0.3–1.2)
Total Protein: 6.9 g/dL (ref 6.5–8.1)

## 2021-12-22 LAB — URINALYSIS, ROUTINE W REFLEX MICROSCOPIC
Bilirubin Urine: NEGATIVE
Glucose, UA: NEGATIVE mg/dL
Ketones, ur: NEGATIVE mg/dL
Leukocytes,Ua: NEGATIVE
Nitrite: NEGATIVE
Protein, ur: NEGATIVE mg/dL
Specific Gravity, Urine: 1.009 (ref 1.005–1.030)
pH: 5 (ref 5.0–8.0)

## 2021-12-22 LAB — CBG MONITORING, ED
Glucose-Capillary: 48 mg/dL — ABNORMAL LOW (ref 70–99)
Glucose-Capillary: 138 mg/dL — ABNORMAL HIGH (ref 70–99)
Glucose-Capillary: 52 mg/dL — ABNORMAL LOW (ref 70–99)
Glucose-Capillary: 115 mg/dL — ABNORMAL HIGH (ref 70–99)
Glucose-Capillary: 78 mg/dL (ref 70–99)

## 2021-12-22 LAB — CBC
HCT: 37.2 % (ref 36.0–46.0)
Hemoglobin: 12.1 g/dL (ref 12.0–15.0)
MCH: 29.7 pg (ref 26.0–34.0)
MCHC: 32.5 g/dL (ref 30.0–36.0)
MCV: 91.4 fL (ref 80.0–100.0)
Platelets: 206 10*3/uL (ref 150–400)
RBC: 4.07 MIL/uL (ref 3.87–5.11)
RDW: 13.1 % (ref 11.5–15.5)
WBC: 6.4 10*3/uL (ref 4.0–10.5)
nRBC: 0 % (ref 0.0–0.2)

## 2021-12-22 LAB — GLUCOSE, CAPILLARY: Glucose-Capillary: 65 mg/dL — ABNORMAL LOW (ref 70–99)

## 2021-12-22 MED ORDER — HYDROMORPHONE HCL 1 MG/ML IJ SOLN
0.5000 mg | INTRAMUSCULAR | Status: DC | PRN
  Administered 2021-12-22 – 2021-12-24 (×15): 1 mg via INTRAVENOUS
  Filled 2021-12-22 (×15): qty 1

## 2021-12-22 MED ORDER — PRAZOSIN HCL 2 MG PO CAPS
2.0000 mg | ORAL_CAPSULE | Freq: Every day | ORAL | Status: DC
  Administered 2021-12-22 – 2021-12-23 (×2): 2 mg via ORAL
  Filled 2021-12-22 (×4): qty 1

## 2021-12-22 MED ORDER — TIZANIDINE HCL 4 MG PO TABS
4.0000 mg | ORAL_TABLET | Freq: Four times a day (QID) | ORAL | Status: DC | PRN
Start: 2021-12-22 — End: 2021-12-24
  Administered 2021-12-23 (×2): 4 mg via ORAL
  Filled 2021-12-22 (×2): qty 1

## 2021-12-22 MED ORDER — DULOXETINE HCL 60 MG PO CPEP
60.0000 mg | ORAL_CAPSULE | Freq: Two times a day (BID) | ORAL | Status: DC
  Administered 2021-12-22 – 2021-12-24 (×5): 60 mg via ORAL
  Filled 2021-12-22 (×2): qty 1
  Filled 2021-12-22: qty 2
  Filled 2021-12-22 (×2): qty 1

## 2021-12-22 MED ORDER — GABAPENTIN 300 MG PO CAPS
300.0000 mg | ORAL_CAPSULE | Freq: Three times a day (TID) | ORAL | Status: DC
  Administered 2021-12-22 – 2021-12-24 (×7): 300 mg via ORAL
  Filled 2021-12-22 (×7): qty 1

## 2021-12-22 MED ORDER — INSULIN ASPART 100 UNIT/ML IJ SOLN: 0.0000 [IU] | Freq: Three times a day (TID) | INTRAMUSCULAR | Status: DC

## 2021-12-22 MED ORDER — FUROSEMIDE 40 MG PO TABS
40.0000 mg | ORAL_TABLET | Freq: Every day | ORAL | Status: DC
  Administered 2021-12-22 – 2021-12-24 (×3): 40 mg via ORAL
  Filled 2021-12-22 (×3): qty 1

## 2021-12-22 MED ORDER — HEPARIN SODIUM (PORCINE) 5000 UNIT/ML IJ SOLN
5000.0000 [IU] | Freq: Three times a day (TID) | INTRAMUSCULAR | Status: DC
  Administered 2021-12-22 – 2021-12-24 (×6): 5000 [IU] via SUBCUTANEOUS
  Filled 2021-12-22 (×6): qty 1

## 2021-12-22 MED ORDER — ONDANSETRON HCL 4 MG/2ML IJ SOLN: 4.0000 mg | Freq: Four times a day (QID) | INTRAMUSCULAR | Status: DC | PRN

## 2021-12-22 MED ORDER — HYDROXYZINE HCL 25 MG PO TABS
50.0000 mg | ORAL_TABLET | Freq: Four times a day (QID) | ORAL | Status: DC | PRN
Start: 2021-12-22 — End: 2021-12-24

## 2021-12-22 MED ORDER — DEXTROSE 50 % IV SOLN
1.0000 | Freq: Once | INTRAVENOUS | Status: AC
Start: 2021-12-22 — End: 2021-12-22
  Administered 2021-12-22: 50 mL via INTRAVENOUS

## 2021-12-22 MED ORDER — ACETAMINOPHEN 325 MG PO TABS: 650.0000 mg | ORAL_TABLET | Freq: Four times a day (QID) | ORAL | Status: DC | PRN

## 2021-12-22 MED ORDER — HYDROMORPHONE HCL 1 MG/ML IJ SOLN
1.0000 mg | Freq: Once | INTRAMUSCULAR | Status: AC
  Administered 2021-12-22: 1 mg via INTRAVENOUS
  Filled 2021-12-22: qty 1

## 2021-12-22 MED ORDER — INSULIN ASPART 100 UNIT/ML IJ SOLN: 0.0000 [IU] | Freq: Every day | INTRAMUSCULAR | Status: DC

## 2021-12-22 MED ORDER — DEXTROSE 50 % IV SOLN
1.0000 | Freq: Once | INTRAVENOUS | Status: AC
  Administered 2021-12-22: 50 mL via INTRAVENOUS
  Filled 2021-12-22: qty 50

## 2021-12-22 MED ORDER — CARBAMAZEPINE ER 100 MG PO TB12
200.0000 mg | ORAL_TABLET | Freq: Two times a day (BID) | ORAL | Status: DC
  Administered 2021-12-22 – 2021-12-24 (×5): 200 mg via ORAL
  Filled 2021-12-22 (×8): qty 2

## 2021-12-22 MED ORDER — DEXTROSE 50 % IV SOLN
INTRAVENOUS | Status: AC
  Filled 2021-12-22: qty 50

## 2021-12-22 MED ORDER — INSULIN GLARGINE-YFGN 100 UNIT/ML ~~LOC~~ SOLN
42.0000 [IU] | Freq: Every day | SUBCUTANEOUS | Status: DC
Start: 2021-12-22 — End: 2021-12-24
  Administered 2021-12-23: 42 [IU] via SUBCUTANEOUS
  Filled 2021-12-22 (×3): qty 0.42

## 2021-12-22 MED ORDER — ONDANSETRON HCL 4 MG PO TABS: 4.0000 mg | ORAL_TABLET | Freq: Four times a day (QID) | ORAL | Status: DC | PRN

## 2021-12-22 MED ORDER — ESCITALOPRAM OXALATE 10 MG PO TABS
10.0000 mg | ORAL_TABLET | Freq: Every day | ORAL | Status: DC
  Administered 2021-12-22 – 2021-12-24 (×3): 10 mg via ORAL
  Filled 2021-12-22 (×4): qty 1

## 2021-12-22 NOTE — ED Provider Notes (Signed)
Patient with back pain and pain radiating down her right leg along with weakness in the left leg since Monday.  MRI shows multilevel multifactorial lumbar spinal stenosis.  I spoke with neurosurgery Dr. Kathyrn Sheriff and he states the patient can be admitted to medicine at [facility] and he will consult on the patient   Milton Ferguson, MD 12/22/21 1043

## 2021-12-22 NOTE — ED Notes (Signed)
Beeped to [phone] per Dr. Roderic Palau.[employer]

## 2021-12-22 NOTE — ED Notes (Signed)
Pt resting peacefully. Unlabored Symmetrical chest rise noted

## 2021-12-22 NOTE — H&P (Addendum)
History and Physical    [name]:[id number] DOB: April 09, 1961 DOA: 12/21/2021  PCP: Teressa Senter, FNP   Patient coming from: Home  Chief Complaint: Low back pain with leg weakness  HPI: [name] is a 61 y.o. female with medical history significant for morbid obesity, CKD stage IV, hypertension, type 2 diabetes, and neuropathy who presented to the ED with noted low back and flank pain that suddenly started on Monday of this week.  She denies any trauma or particular aggravating or alleviating factors, but states that the pain was dull and achy and radiated down her right lower extremity into her foot.  She normally is wheelchair-bound, but does tend to ambulate to go to the bathroom and states that she has been having a more difficult time weightbearing on her left lower extremity and is complaining of some weakness and unsteadiness.  She denies any bowel or bladder incontinence.  She has a sore on the dorsum of her right foot with some weeping drainage occasionally that she states was supposed to be addressed by a provider at an upcoming appointment.  No new fevers or chills noted and she states that she is compliant with her home medications.  She denies any alcohol or tobacco use.   ED Course: Imaging studies reveal lumbar spinal stenosis on MRI as well as thoracic disc herniation at the level of T8/T9 with concern for thoracic myelopathy.  Creatinine is around baseline at 2.04 and vital signs are otherwise stable.  Case has been discussed with neurosurgery Dr. Kathyrn Sheriff who is in agreement with transfer to [facility] for further evaluation.  Review of Systems: Reviewed as noted above, otherwise negative.  Past Medical History:  Diagnosis Date   Chest pain    a. 06/2017: cath showing normal cors with a preserved EF of 55-65%.    Depression    Diabetes mellitus    Hypertension    Neuropathy    Pneumonia    Sepsis ([place])     Past Surgical History:  Procedure  Laterality Date   ABDOMINAL HYSTERECTOMY     APPENDECTOMY     CESAREAN SECTION     CHOLECYSTECTOMY     LEFT HEART CATH AND CORONARY ANGIOGRAPHY N/A 07/12/2017   Procedure: LEFT HEART CATH AND CORONARY ANGIOGRAPHY;  Surgeon: Lorretta Harp, MD;  Location: [facility];  Service: Cardiovascular;  Laterality: N/A;     reports that she has never smoked. She has never used smokeless tobacco. She reports that she does not drink alcohol and does not use drugs.  Allergies  Allergen Reactions   Ibuprofen Swelling   Red Dye     Other reaction(s): Nervous syncope   Codeine Palpitations   Nitrofurantoin Diarrhea and Nausea And Vomiting    Family History  Problem Relation Age of Onset   Diabetes Mother    Heart failure Mother    Hypertension Mother    Cancer Mother    CAD Mother 81       Died of MI   Diabetes Father    Alcohol abuse Father     Prior to Admission medications   Medication Sig Start Date End Date Taking? Authorizing Provider  acetaminophen (TYLENOL) 325 MG tablet Take 2 tablets (650 mg total) by mouth every 6 (six) hours as needed for moderate pain or fever. 10/09/21  Yes Jaynee Eagles, PA-C  carbamazepine (CARBATROL) 200 MG 12 hr capsule Take 200 mg by mouth 2 (two) times daily.   Yes [provider]  DULoxetine (CYMBALTA) 60 MG capsule Take 60 mg by mouth 2 (two) times daily.    Yes [provider]  escitalopram (LEXAPRO) 10 MG tablet Take 10 mg by mouth daily. 02/15/21  Yes [provider]  furosemide (LASIX) 40 MG tablet Take 1 tablet (40 mg total) by mouth daily. Start 11/25/2021 11/25/21 11/25/22 Yes Emokpae, Courage, MD  gabapentin (NEURONTIN) 600 MG tablet Take 0.5 tablets (300 mg total) by mouth 3 (three) times daily. Patient taking differently: Take 600 mg by mouth in the morning, at noon, in the evening, and at bedtime. 05/18/21  Yes Kathie Dike, MD  hydrOXYzine (ATARAX/VISTARIL) 25 MG tablet Take 50 mg by mouth every 6 (six) hours  as needed for itching. 03/11/21  Yes [provider]  MOUNJARO 5 MG/0.5ML Pen Inject 0.5 mLs into the skin once a week. 11/23/21  Yes [provider]  NOVOLOG RELION 100 UNIT/ML injection SLIDING SCALE: BLOOD SUGAR <70: INITIATE HYPOGLYCEMIA PROTOCOL. 70-130: 0 UNITS; 130 TO 180: 4 UNITS; 180 TO 240: 8 UNITS; 241 TO 300: 10 UNITS; 301 TO 350: 12 UNITS; 351 TO 400: 16 UNITS; >400: 20 UNITS AND NOTIFY PROVIDER 10/31/21  Yes [provider]  pioglitazone (ACTOS) 15 MG tablet Take 15 mg by mouth daily. 02/12/21  Yes [provider]  prazosin (MINIPRESS) 2 MG capsule Take 2 mg by mouth at bedtime. 03/15/21  Yes [provider]  tiZANidine (ZANAFLEX) 4 MG tablet Take 4 mg by mouth every 6 (six) hours as needed for muscle spasms. 05/15/21  Yes [provider]  TRESIBA 100 UNIT/ML SOLN Inject 84 Units into the skin at bedtime. 10/31/21  Yes [provider]  traMADol (ULTRAM) 50 MG tablet Take 1 tablet (50 mg total) by mouth every 8 (eight) hours as needed. Patient not taking: Reported on 12/22/2021 11/23/21 11/23/22  Roxan Hockey, MD    Physical Exam: Vitals:   12/22/21 0345 12/22/21 0500 12/22/21 1010 12/22/21 1100  BP: (!) 121/50 105/67 129/60 (!) 127/54  Pulse: 86 84 93 96  Resp: '18 17 20   '$ Temp:      TempSrc:      SpO2: 96% 95% 92% 97%  Weight:      Height:        Constitutional: NAD, calm, comfortable, morbidly obese Vitals:   12/22/21 0345 12/22/21 0500 12/22/21 1010 12/22/21 1100  BP: (!) 121/50 105/67 129/60 (!) 127/54  Pulse: 86 84 93 96  Resp: '18 17 20   '$ Temp:      TempSrc:      SpO2: 96% 95% 92% 97%  Weight:      Height:       Eyes: lids and conjunctivae normal Neck: normal, supple Respiratory: clear to auscultation bilaterally. Normal respiratory effort. No accessory muscle use.  Cardiovascular: Regular rate and rhythm, no murmurs. Abdomen: no tenderness, no distention. Bowel sounds positive.  Musculoskeletal:  No  edema. Skin: no rashes, lesions, ulcers.     Psychiatric: Flat affect  Labs on Admission: I have personally reviewed following labs and imaging studies  CBC: Recent Labs  Lab 12/21/21 2336  WBC 6.4  HGB 12.1  HCT 37.2  MCV 91.4  PLT 852   Basic Metabolic Panel: Recent Labs  Lab 12/21/21 2336  NA 137  K 3.8  CL 104  CO2 26  GLUCOSE 67*  BUN 56*  CREATININE 2.04*  CALCIUM 8.3*   GFR: Estimated Creatinine Clearance: 38.8 mL/min (A) (by C-G formula based on SCr  of 2.04 mg/dL (H)). Liver Function Tests: Recent Labs  Lab 12/21/21 2336  AST 21  ALT 15  ALKPHOS 123  BILITOT 1.2  PROT 6.9  ALBUMIN 3.3*   No results for input(s): LIPASE, AMYLASE in the last 168 hours. No results for input(s): AMMONIA in the last 168 hours. Coagulation Profile: No results for input(s): INR, PROTIME in the last 168 hours. Cardiac Enzymes: No results for input(s): CKTOTAL, CKMB, CKMBINDEX, TROPONINI in the last 168 hours. BNP (last 3 results) No results for input(s): PROBNP in the last 8760 hours. HbA1C: No results for input(s): HGBA1C in the last 72 hours. CBG: Recent Labs  Lab 12/22/21 1013 12/22/21 1043  GLUCAP 48* 138*   Lipid Profile: No results for input(s): CHOL, HDL, LDLCALC, TRIG, CHOLHDL, LDLDIRECT in the last 72 hours. Thyroid Function Tests: No results for input(s): TSH, T4TOTAL, FREET4, T3FREE, THYROIDAB in the last 72 hours. Anemia Panel: No results for input(s): VITAMINB12, FOLATE, FERRITIN, TIBC, IRON, RETICCTPCT in the last 72 hours. Urine analysis:    Component Value Date/Time   COLORURINE YELLOW 12/21/2021 0352   APPEARANCEUR CLEAR 12/21/2021 0352   LABSPEC 1.009 12/21/2021 0352   PHURINE 5.0 12/21/2021 0352   GLUCOSEU NEGATIVE 12/21/2021 0352   HGBUR SMALL (A) 12/21/2021 0352   BILIRUBINUR NEGATIVE 12/21/2021 0352   BILIRUBINUR negative 10/09/2021 1447   KETONESUR NEGATIVE 12/21/2021 0352   PROTEINUR NEGATIVE 12/21/2021 0352   UROBILINOGEN 0.2  10/09/2021 1447   UROBILINOGEN 0.2 01/29/2011 0831   NITRITE NEGATIVE 12/21/2021 0352   LEUKOCYTESUR NEGATIVE 12/21/2021 0352    Radiological Exams on Admission: CT ABDOMEN PELVIS WO CONTRAST  Result Date: 12/22/2021 CLINICAL DATA:  Flank pain, kidney stone suspected EXAM: CT ABDOMEN AND PELVIS WITHOUT CONTRAST TECHNIQUE: Multidetector CT imaging of the abdomen and pelvis was performed following the standard protocol without IV contrast. RADIATION DOSE REDUCTION: This exam was performed according to the departmental dose-optimization program which includes automated exposure control, adjustment of the mA and/or kV according to patient size and/or use of iterative reconstruction technique. COMPARISON:  12/07/2009 FINDINGS: Lower chest: Old left rib fractures, healed. Scarring in the lung bases. No effusions. Heart is normal size. Hepatobiliary: No focal liver abnormality is seen. Status post cholecystectomy. No biliary dilatation. Pancreas: No focal abnormality or ductal dilatation. Spleen: No focal abnormality.  Normal size. Adrenals/Urinary Tract: 4.4 cm fat containing lesion in the left adrenal gland compatible with myelolipoma. Small fatty lesion in the right adrenal gland measuring 10 mm also compatible with myelolipoma. Subtle low-density area in the lower pole of the left kidney measuring 2.7 cm. This previously measured 10 mm on prior study. No renal or ureteral stones. No hydronephrosis. Urinary bladder is unremarkable. Stomach/Bowel: Stomach, large and small bowel grossly unremarkable. Vascular/Lymphatic: Aortic atherosclerosis. No evidence of aneurysm or adenopathy. Reproductive: Prior hysterectomy.  No adnexal masses. Other: No free fluid or free air. Musculoskeletal: No acute bony abnormality. IMPRESSION: Bilateral adrenal myelolipomas. 2.7 cm low-density lesion in the lower pole of the left kidney cannot be characterized on this noncontrast study. This could be further characterized with renal  ultrasound. No renal or ureteral stones.  No hydronephrosis. Aortic atherosclerosis. Electronically Signed   By: Rolm Baptise M.D.   On: 12/22/2021 00:48   MR LUMBAR SPINE WO CONTRAST  Result Date: 12/22/2021 CLINICAL DATA:  61 year old female with pain radiating to the right flank, down the right leg, left leg weakness. EXAM: MRI LUMBAR SPINE WITHOUT CONTRAST TECHNIQUE: Multiplanar, multisequence MR imaging of the lumbar spine was  performed. No intravenous contrast was administered. COMPARISON:  CT Abdomen and Pelvis and lumbar spine CT 0020 hours today. FINDINGS: Segmentation:  Normal on the comparison. Alignment: Preserved lumbar lordosis with mild grade 1 anterolisthesis of L4 on L5, 3-4 mm. Vertebrae: No convincing marrow edema. Normal background bone marrow signal. Intact visible sacrum and SI joints. Conus medullaris and cauda equina: Conus extends to the T12-L1 level. No lower spinal cord or conus signal abnormality. Paraspinal and other soft tissues: There is confluent soft tissue inflammation adjacent to the left L5-S1 facets best seen on series 6, image 11. No definite associated facet edema. But underlying chronic facet arthropathy, see details below. No paraspinal fluid collection. Distended urinary bladder. Stable visible abdominal viscera. Disc levels: T8-T9 vacuum disc and bulky disc herniation demonstrated on the CT 0020 hours today ( CT Abdomen and Pelvis series 2, image 19). On these images there is a left paracentral disc protrusion at T11-T12 but no significant lower thoracic spinal stenosis. T12-L1:  Mostly anterior mild disc bulging. L1-L2: Negative except for epidural lipomatosis, which results in mild spinal stenosis. L2-L3: Circumferential disc bulge eccentric to the left. Mild to moderate epidural lipomatosis. Mild facet and ligament flavum hypertrophy. Mild to moderate spinal stenosis. Mild bilateral L2 foraminal stenosis. L3-L4: Circumferential disc bulge. Moderate epidural  lipomatosis and posterior element hypertrophy. Small posteriorly situated synovial cyst on the right series 8, image 18). Moderate to severe spinal stenosis. Mild bilateral L3 foraminal stenosis. L4-L5: Grade 1 anterolisthesis. Circumferential disc/pseudo disc. Severe facet hypertrophy. Epidural lipomatosis. Severe spinal stenosis. Lateral recess stenosis greater on the right (right L5 nerve level). Foraminal disc is greater on the left. Moderate to severe left and moderate right L4 foraminal stenosis. L5-S1: Mostly far lateral disc bulging. Moderate facet hypertrophy. No facet joint fluid. Epidural lipomatosis. No significant stenosis. IMPRESSION: 1. Note the presence of moderate size thoracic disc herniation at T8-T9 on CT earlier today. Query Thoracic myelopathy. 2. Multilevel multifactorial Lumbar Spinal Stenosis, in part related to epidural lipomatosis, is moderate or severe from L2-L3 through L4-L5 - maximal at the latter with underlying grade 1 anterolisthesis and severe facet arthropathy there. 3. Nonspecific soft tissue inflammation adjacent to chronically degenerated left side L5-S1 facets. But no marrow edema or facet joint fluid to suggest septic arthritis. Electronically Signed   By: Genevie Ann M.D.   On: 12/22/2021 07:44   CT L-SPINE NO CHARGE  Result Date: 12/22/2021 CLINICAL DATA:  Initial evaluation for lower back pain, right lower extremity neuropathy. EXAM: CT LUMBAR SPINE WITHOUT CONTRAST TECHNIQUE: Multidetector CT imaging of the lumbar spine was performed without intravenous contrast administration. Multiplanar CT image reconstructions were also generated. RADIATION DOSE REDUCTION: This exam was performed according to the departmental dose-optimization program which includes automated exposure control, adjustment of the mA and/or kV according to patient size and/or use of iterative reconstruction technique. COMPARISON:  Radiograph from 11/23/2021. FINDINGS: Segmentation: Standard. Lowest  well-formed disc space labeled the L5-S1 level. Alignment: 4 mm facet mediated anterolisthesis of L4 on L5. Alignment otherwise normal with preservation of the normal lumbar lordosis. Vertebrae: Vertebral body height maintained without acute or chronic fracture. Visualized sacrum and pelvis intact. SI joints symmetric and within normal limits. No discrete or worrisome osseous lesions. Paraspinal and other soft tissues: Paraspinous soft tissues demonstrate no acute finding. Aortic atherosclerosis. Bilateral adrenal myelolipomas, better evaluated on concomitant CT of the abdomen and pelvis. Disc levels: L1-2: Negative interspace. Mild facet hypertrophy. No significant canal or foraminal stenosis. L2-3: Mild circumferential disc bulge,  asymmetric to the right. Left greater than right facet hypertrophy. Resultant mild to moderate right lateral recess stenosis, descending L3 nerve root level. Central canal remains patent. Mild left with mild to moderate right L2 foraminal narrowing. L3-4: Diffuse disc bulge. Superimposed left foraminal to extraforaminal disc protrusion contacts the exiting left L3 nerve root (series 7, image 71). Mild to moderate facet hypertrophy. Resultant mild spinal stenosis. Mild to moderate bilateral L3 foraminal narrowing. L4-5: 4 mm anterolisthesis. Associated intervertebral disc space narrowing with broad posterior disc bulge. Severe bilateral facet arthrosis. Resultant severe canal with bilateral subarticular stenosis, with severe right and moderate left L4 foraminal narrowing. L5-S1: Disc bulge with endplate spurring. Moderate bilateral facet hypertrophy. No significant spinal stenosis. Mild to moderate right with mild left L5 foraminal stenosis. IMPRESSION: 1. No acute abnormality within the lumbar spine. 2. 4 mm facet mediated anterolisthesis of L4 on L5 with resultant severe canal with bilateral subarticular stenosis, with severe right and moderate left L4 foraminal narrowing. 3. Disc bulge  with facet hypertrophy at L2-3 with resultant mild to moderate right lateral recess and foraminal stenosis. 4. Left foraminal to extraforaminal disc protrusion at L3-4, potentially affecting the exiting left L3 nerve root. 5. Bilateral adrenal myelolipomas, better evaluated on concomitant CT of the abdomen and pelvis. 6. Aortic Atherosclerosis (ICD10-I70.0). Electronically Signed   By: Jeannine Boga M.D.   On: 12/22/2021 01:18   DG Foot Complete Right  Result Date: 12/22/2021 CLINICAL DATA:  Ulcer on dorsal aspect of foot. EXAM: RIGHT FOOT COMPLETE - 3+ VIEW COMPARISON:  Right foot x-ray 12/13/2021 FINDINGS: There is soft tissue swelling of the foot with ulceration over the plantar aspect of the midfoot. There is no underlying cortical erosion or periosteal reaction. There is no acute fracture or dislocation. Plantar calcaneal spur is again seen. Chronic changes of the midfoot and tarsal bones as well as tarsometatarsal joints appear unchanged from the prior examination compatible with Charcot foot. There are healed deformities of the proximal first and second metatarsals. No evidence for foreign body. Peripheral vascular calcifications are again seen. IMPRESSION: 1. Diffuse soft tissue swelling of the foot with plantar ulceration. 2. No acute bony abnormality. Electronically Signed   By: Ronney Asters M.D.   On: 12/22/2021 00:48     Assessment/Plan Principal Problem:   Leg weakness Active Problems:   Obesity, Class III, BMI 40-49.9 (morbid obesity) (HCC)   Type 2 diabetes mellitus (HCC)   Essential hypertension   CKD (chronic kidney disease) stage 4, GFR 15-29 ml/min (HCC)   Neuropathy   Mass of both adrenal glands (HCC)    Low back pain with radiculopathy and leg weakness -Findings on lumbar MRI concerning for stenosis and potential thoracic myelopathy at T8/T9 -No concern for cauda equina at this time -Transfer to [facility] for neurosurgery evaluation -Pain management -PT/OT  evaluation  Bilateral adrenal mass -Suspicious of adrenal myelolipomas -Obtain renal ultrasound for further characterization  Right foot plantar ulceration -Continue close monitoring/wound care -Does not appear infected -Patient states that she has an appointment upcoming to address this  Left Charcot foot -Cannot weight-bear adequately on this leg -Multiple surgeries in the past in this area  Type 2 diabetes -SSI while inpatient -Continue home Tresiba at half dose -Carb modified diet  Hypertension -Continue prazosin at bedtime -Continue Lasix  CKD stage IV -Baseline creatinine approximately 1.5-2 and creatinine is near baseline at this time -Continue to monitor  Neuropathy -Likely multifactorial in the setting of diabetes as well as findings on  imaging -Continue gabapentin, Cymbalta, and carbamazepine     DVT prophylaxis: Heparin Code Status: Full Family Communication: Discussed with daughter on phone 6/1 Disposition Plan: Transfer to [facility] for neurosurgery evaluation Consults called: Neurosurgery-Dr. Kathyrn Sheriff Admission status: Inpatient, MedSurg  Severity of Illness: The appropriate patient status for this patient is INPATIENT. Inpatient status is judged to be reasonable and necessary in order to provide the required intensity of service to ensure the patient's safety. The patient's presenting symptoms, physical exam findings, and initial radiographic and laboratory data in the context of their chronic comorbidities is felt to place them at high risk for further clinical deterioration. Furthermore, it is not anticipated that the patient will be medically stable for discharge from the hospital within 2 midnights of admission.   * I certify that at the point of admission it is my clinical judgment that the patient will require inpatient hospital care spanning beyond 2 midnights from the point of admission due to high intensity of service, high risk for further  deterioration and high frequency of surveillance required.*   Aleysha Meckler D Joren Rehm DO [facility]  If 7PM-7AM, please contact night-coverage [email or web address]  12/22/2021, 11:48 AM

## 2021-12-22 NOTE — ED Notes (Signed)
Inquired about pt needing to urinate. Pt states does not need to urinate at this time

## 2021-12-22 NOTE — ED Notes (Signed)
[facility] called for transport at this time. 

## 2021-12-22 NOTE — ED Notes (Signed)
Pt is on cellphone and comfortable

## 2021-12-23 DIAGNOSIS — R262 Difficulty in walking, not elsewhere classified: Secondary | ICD-10-CM

## 2021-12-23 DIAGNOSIS — I10 Essential (primary) hypertension: Secondary | ICD-10-CM

## 2021-12-23 DIAGNOSIS — L97519 Non-pressure chronic ulcer of other part of right foot with unspecified severity: Secondary | ICD-10-CM

## 2021-12-23 DIAGNOSIS — M5104 Intervertebral disc disorders with myelopathy, thoracic region: Secondary | ICD-10-CM

## 2021-12-23 DIAGNOSIS — N289 Disorder of kidney and ureter, unspecified: Secondary | ICD-10-CM

## 2021-12-23 LAB — BASIC METABOLIC PANEL
Anion gap: 6 (ref 5–15)
BUN: 41 mg/dL — ABNORMAL HIGH (ref 6–20)
CO2: 25 mmol/L (ref 22–32)
Calcium: 8.4 mg/dL — ABNORMAL LOW (ref 8.9–10.3)
Chloride: 107 mmol/L (ref 98–111)
Creatinine, Ser: 1.55 mg/dL — ABNORMAL HIGH (ref 0.44–1.00)
GFR, Estimated: 39 mL/min — ABNORMAL LOW (ref >60)
Glucose, Bld: 84 mg/dL (ref 70–99)
Potassium: 4.1 mmol/L (ref 3.5–5.1)
Sodium: 138 mmol/L (ref 135–145)

## 2021-12-23 LAB — CBC
HCT: 37.7 % (ref 36.0–46.0)
Hemoglobin: 12.2 g/dL (ref 12.0–15.0)
MCH: 29.8 pg (ref 26.0–34.0)
MCHC: 32.4 g/dL (ref 30.0–36.0)
MCV: 92 fL (ref 80.0–100.0)
Platelets: 220 10*3/uL (ref 150–400)
RBC: 4.1 MIL/uL (ref 3.87–5.11)
RDW: 13 % (ref 11.5–15.5)
WBC: 5 10*3/uL (ref 4.0–10.5)
nRBC: 0 % (ref 0.0–0.2)

## 2021-12-23 LAB — MAGNESIUM: Magnesium: 2.5 mg/dL — ABNORMAL HIGH (ref 1.7–2.4)

## 2021-12-23 LAB — GLUCOSE, CAPILLARY
Glucose-Capillary: 78 mg/dL (ref 70–99)
Glucose-Capillary: 86 mg/dL (ref 70–99)
Glucose-Capillary: 118 mg/dL — ABNORMAL HIGH (ref 70–99)
Glucose-Capillary: 118 mg/dL — ABNORMAL HIGH (ref 70–99)

## 2021-12-23 NOTE — Progress Notes (Signed)
  Progress Note   Patient: [name] ZMO:[id number] DOB: 03-28-1961 DOA: 12/21/2021     1 DOS: the patient was seen and examined on 12/23/2021 at 9:28AM      Brief hospital course: Mrs. [name] is a 61 y.o. F with HTN, DM, CKD IIIb baseline 1.5 who presented with acute low back pain, radiculopathy and leg weakness.   6/1: Admitted; MRI in the ER showed thoracic disc herniation at T8/9 and ?myelopathy, transferred to [facility] for neurosurgery evaluation  6/2: Neurosurgery recommended medical treamtent only     Assessment and Plan: * Thoracic disc disease with myelopathy - Consult Neurosurgery - PT eval - Continue gabapentin, tixanidine, acetaminophen or hydromorphine for now  Ambulatory dysfunction At baseline patient is wheelchair bound, can transfer short distances independently.  Right foot ulcer ([place]) - Outpatient wound clinic follow up  Lesion of left native kidney Bilateral adrenal myelolipomas noted incidentally on CT are benign.  2.7 cm low density lesion in lower left kidney also visualized incidentally, significance unclear.  Renal US ordered in follow up but patient too obese to obtain useful images.  Given that I am not best suited to determine next courses of action based on imaging findings, rather than subject the patient to further imaging, will refer to Urology at discharge for evaluation and decisions regarding utility of further imaging.  Stage 3a chronic kidney disease (CKD) ([name]) CKD IV ruled out.  Baseline is around 1.5 per CE.  Obesity, Class III, BMI 40-49.9 (morbid obesity) (HCC) BMI 46  Essential hypertension BP normal - Continue furoesmide, prazosin  Type 2 diabetes mellitus (HCC) Glucose normal to low - Continue glargine for now - Continue SS corrections, reduce dose          Subjective: No change from yesterday     Physical Exam: Vitals:   12/23/21 0339 12/23/21 0801 12/23/21 1205 12/23/21 1634  BP: (!) 122/58 (!) 115/39  (!) 116/51 (!) 104/93  Pulse: 88 86 86 84  Resp: '20 18 20 20  '$ Temp: 98.9 F (37.2 C) 98.6 F (37 C) 98.6 F (37 C) 98.7 F (37.1 C)  TempSrc: Oral Oral Oral Oral  SpO2: 99% 94% 94% 96%  Weight:      Height:       Obese adult female, lying in bed, no acute distress RRR, no murmurs, no peripheral edema Respiratory rate normal, lungs clear without rales or wheezes Abdomen soft no tenderness palpation or guarding She has mild weakness in the right leg, mostly limited by pain, the left leg is also somewhat weak although stronger than the right no loss of sensation.  Data Reviewed: Discussed with neurosurgery, nursing notes reviewed, vital signs reviewed Glucose reviewed and somewhat low Creatinine down to 1.5 Sodium potassium normal, complete blood count normal      Disposition: Status is: Inpatient The patient was admitted with radiculopathy, question of myelopathy on imaging.  She was evaluated by neurosurgery who recommended no surgical intervention.  Patient is to be determined safe disposition she is medically ready for discharge        Author: Edwin Dada, MD 12/23/2021 5:43 PM  For on call review [email or web address].

## 2021-12-23 NOTE — Evaluation (Signed)
Physical Therapy Evaluation Patient Details Name: [name] MRN: [id number] DOB: 1961/03/08 Today's Date: 12/23/2021  History of Present Illness  Imaging studies reveal lumbar spinal stenosis on MRI as well as thoracic disc herniation at the level of T8/T9 with concern for thoracic myelopathy.  Clinical Impression  Pt admitted with/for LBP and associated weakness.  Pt needing up to moderate assist for bed mobility and transfers. .  Pt currently limited functionally due to the problems listed. ( See problems list.)   Pt will benefit from PT to maximize function and safety in order to get ready for next venue listed below.        Recommendations for follow up therapy are one component of a multi-disciplinary discharge planning process, led by the attending physician.  Recommendations may be updated based on patient status, additional functional criteria and insurance authorization.  Follow Up Recommendations Other (comment) (depending on further intervention may need therapy before going home)    Assistance Recommended at Discharge Frequent or constant Supervision/Assistance  Patient can return home with the following  A little help with walking and/or transfers;A little help with bathing/dressing/bathroom;Assistance with cooking/housework;Assist for transportation;Help with stairs or ramp for entrance    Equipment Recommendations Other (comment) (TBA)  Recommendations for Other Services       Functional Status Assessment Patient has had a recent decline in their functional status and demonstrates the ability to make significant improvements in function in a reasonable and predictable amount of time.     Precautions / Restrictions Precautions Precautions: Fall      Mobility  Bed Mobility Overal bed mobility: Needs Assistance Bed Mobility: Rolling, Sidelying to Sit, Sit to Sidelying Rolling: Min assist Sidelying to sit: Mod assist     Sit to sidelying: Mod  assist General bed mobility comments: truncal assist/LE assist due to pain    Transfers Overall transfer level: Needs assistance   Transfers: Sit to/from Stand, Bed to chair/wheelchair/BSC Sit to Stand: Min assist Stand pivot transfers: Min assist         General transfer comment: stability/safety assist due to weakness and pain.    Ambulation/Gait               General Gait Details: NT  Stairs            Wheelchair Mobility    Modified Rankin (Stroke Patients Only)       Balance Overall balance assessment: Needs assistance   Sitting balance-Leahy Scale: Good       Standing balance-Leahy Scale: Poor Standing balance comment: reliant on UE support                             Pertinent Vitals/Pain Pain Assessment Pain Assessment: 0-10 Pain Score: 9  (2 initially with dilaudid, then up to 9/10 as she sat on BSC) Pain Location: low back, unable to feel into LE's due to neuropathy Pain Descriptors / Indicators: Sharp, Sore Pain Intervention(s): Monitored during session, Limited activity within patient's tolerance, Premedicated before session, Repositioned    Home Living Family/patient expects to be discharged to:: Private residence Living Arrangements: Children Available Help at Discharge: Family;Available 24 hours/day Type of Home: House Home Access: Stairs to enter Entrance Stairs-Rails: Psychiatric nurse of Steps: several   Home Layout: One level Home Equipment: Wheelchair - manual;Other (comment) (?RW)      Prior Function Prior Level of Function : Needs assist       Physical Assist :  Mobility (physical)     Mobility Comments: needs assist up/down steps and in the shower.  Use W/C primarily, transfers mod I and walkes short distances from W/C to shower or toilet.       Hand Dominance        Extremity/Trunk Assessment   Upper Extremity Assessment Upper Extremity Assessment: Overall WFL for tasks  assessed;Generalized weakness    Lower Extremity Assessment Lower Extremity Assessment: Generalized weakness;RLE deficits/detail;LLE deficits/detail RLE Deficits / Details: weak, neuropathic, charcot-like foot with plantar wound RLE Sensation: history of peripheral neuropathy RLE Coordination: decreased fine motor LLE Deficits / Details: weak, neuropathic, charcot-like foot. LLE Sensation: history of peripheral neuropathy LLE Coordination: decreased fine motor    Cervical / Trunk Assessment Cervical / Trunk Assessment: Kyphotic  Communication   Communication: No difficulties  Cognition Arousal/Alertness: Awake/alert Behavior During Therapy: WFL for tasks assessed/performed Overall Cognitive Status: Within Functional Limits for tasks assessed                                          General Comments      Exercises     Assessment/Plan    PT Assessment Patient needs continued PT services  PT Problem List Decreased strength;Decreased activity tolerance;Decreased balance;Decreased mobility;Decreased knowledge of use of DME;Impaired sensation;Pain       PT Treatment Interventions DME instruction;Gait training;Stair training;Therapeutic activities;Balance training;Patient/family education    PT Goals (Current goals can be found in the Care Plan section)  Acute Rehab PT Goals Patient Stated Goal: I've got to decrease this pain. PT Goal Formulation: With patient Time For Goal Achievement: 01/06/22 Potential to Achieve Goals: Good    Frequency Min 4X/week     Co-evaluation               AM-PAC PT "6 Clicks" Mobility  Outcome Measure Help needed turning from your back to your side while in a flat bed without using bedrails?: A Little Help needed moving from lying on your back to sitting on the side of a flat bed without using bedrails?: A Lot Help needed moving to and from a bed to a chair (including a wheelchair)?: A Little Help needed standing up  from a chair using your arms (e.g., wheelchair or bedside chair)?: A Little Help needed to walk in hospital room?: A Lot Help needed climbing 3-5 steps with a railing? : A Lot 6 Click Score: 15    End of Session   Activity Tolerance: Patient tolerated treatment well;Patient limited by pain Patient left: in bed;with call bell/phone within reach;with bed alarm set Nurse Communication: Mobility status PT Visit Diagnosis: Other abnormalities of gait and mobility (R26.89);Pain Pain - part of body:  (back)    Time: 4540-9811 PT Time Calculation (min) (ACUTE ONLY): 42 min   Charges:   PT Evaluation $PT Eval Moderate Complexity: 1 Mod PT Treatments $Therapeutic Activity: 8-22 mins $Self Care/Home Management: 8-22        12/23/2021  Ginger Carne., PT Acute Rehabilitation Services [phone]  (pager) [phone]  (office)  Tessie Fass Trysta Showman 12/23/2021, 2:41 PM

## 2021-12-23 NOTE — Assessment & Plan Note (Signed)
BMI 46 ?

## 2021-12-23 NOTE — Assessment & Plan Note (Signed)
At baseline patient is wheelchair bound, can transfer short distances independently.

## 2021-12-23 NOTE — Assessment & Plan Note (Signed)
BP normal - Continue furoesmide, prazosin

## 2021-12-23 NOTE — Assessment & Plan Note (Signed)
-   Consult Neurosurgery - PT eval - Continue gabapentin, tixanidine, acetaminophen or hydromorphine for now

## 2021-12-23 NOTE — TOC Initial Note (Signed)
Transition of Care [facility]) - Initial/Assessment Note    Patient Details  Name: [name] MRN: [id number] Date of Birth: 06-Dec-1960  Transition of [facility]) CM/SW Contact:    Ninfa Meeker, RN Phone Number: 12/23/2021, 1:03 PM  Clinical Narrative:                 Transition of Care Screening Note:  Transition of Care Department [facility]) has reviewed patient and no TOC needs have been identified at this time. We will continue to monitor patient advancement through Interdisciplinary progressions. If new patient transition needs arise, please place a consult.          Patient Goals and CMS Choice        Expected Discharge Plan and Services                                                Prior Living Arrangements/Services                       Activities of Daily Living Home Assistive Devices/Equipment: Wheelchair ADL Screening (condition at time of admission) Patient's cognitive ability adequate to safely complete daily activities?: Yes Is the patient deaf or have difficulty hearing?: No Does the patient have difficulty seeing, even when wearing glasses/contacts?: No Does the patient have difficulty concentrating, remembering, or making decisions?: No Patient able to express need for assistance with ADLs?: Yes Does the patient have difficulty dressing or bathing?: No Independently performs ADLs?: Yes (appropriate for developmental age) Does the patient have difficulty walking or climbing stairs?: Yes Weakness of Legs: Both Weakness of Arms/Hands: None  Permission Sought/Granted                  Emotional Assessment              Admission diagnosis:  Leg weakness [R29.898] Left leg weakness [R29.898] Spinal stenosis, unspecified spinal region [M48.00] Acute right-sided low back pain, unspecified whether sciatica present [M54.50] Patient Active Problem List   Diagnosis Date Noted   Right foot ulcer ([place]) 12/23/2021    Ambulatory dysfunction 12/23/2021   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   Stage 3a chronic kidney disease (CKD) ([place]) 12/22/2021   Neuropathy 12/22/2021   Lesion of left native kidney 12/22/2021   Dehydration 11/23/2021   Obesity, Class III, BMI 40-49.9 (morbid obesity) ([place]) 11/23/2021   AKI (acute kidney injury) ([place]) 05/16/2021   Serum creatinine raised 08/24/2017   Atypical chest pain 07/12/2017   Chest pain 07/11/2017   Unstable angina ([place]) 07/11/2017   DIZZINESS 04/25/2010   CHEST PAIN, ATYPICAL 04/25/2010   ABDOMINAL PAIN 12/03/2009   PALPITATIONS 11/15/2009   OBESITY 10/07/2009   TROCHANTERIC BURSITIS, LEFT 10/07/2009   Type 2 diabetes mellitus ([place]) 09/30/2009   MDD (major depressive disorder) 09/30/2009   Migraine headache 09/30/2009   Essential hypertension 09/30/2009   GERD 04/15/2009   DIARRHEA, CHRONIC 04/15/2009   PCP:  Teressa Senter, FNP Pharmacy:   [facility], [place] [address] [place] [address] Phone: [phone] Fax: [place] [address] [address] [place] [address] Phone: [phone] Fax: [phone]     Social Determinants of Health (SDOH) Interventions    Readmission Risk Interventions  View : No data to display.

## 2021-12-23 NOTE — Consult Note (Signed)
Chief Complaint   Chief Complaint  Patient presents with   Pain    History of Present Illness  [name] is a 61 y.o. female initially admitted to the hospital as a transfer from [facility].  She presented yesterday with approximately 5 days of severe back and right-sided leg pain.  Symptoms started on Monday morning.  In speaking with her daughter, patient is essentially wheelchair-bound at baseline, with the ability to ambulate primarily to use the restroom.  On Sunday, she was up out of her wheelchair periodically much more than usual, helping her family move into a new house.  Subsequent to this, she woke up with severe pain on Monday morning.  She describes pain in the right side of her low back with radiation through the back of the right leg and into the right foot.  She describes much less left-sided pain.  She also complains of subjective right leg weakness.  She has not noted any changes in bladder function.  Of note, the patient has significant medical comorbidities including morbid obesity, hypertension, diabetes, stage IV chronic kidney disease.  She also has a Charcot foot on the right, and chronic nonhealing stasis ulcers on her feet.  Again, she is essentially wheelchair-bound at baseline.  Past Medical History   Past Medical History:  Diagnosis Date   Chest pain    a. 06/2017: cath showing normal cors with a preserved EF of 55-65%.    Depression    Diabetes mellitus    Hypertension    Neuropathy    Pneumonia    Sepsis ([place])     Past Surgical History   Past Surgical History:  Procedure Laterality Date   ABDOMINAL HYSTERECTOMY     APPENDECTOMY     CESAREAN SECTION     CHOLECYSTECTOMY     LEFT HEART CATH AND CORONARY ANGIOGRAPHY N/A 07/12/2017   Procedure: LEFT HEART CATH AND CORONARY ANGIOGRAPHY;  Surgeon: Lorretta Harp, MD;  Location: [facility];  Service: Cardiovascular;  Laterality: N/A;    Social History   Social History    Tobacco Use   Smoking status: Never   Smokeless tobacco: Never  Substance Use Topics   Alcohol use: No   Drug use: No    Medications   Prior to Admission medications   Medication Sig Start Date End Date Taking? Authorizing Provider  acetaminophen (TYLENOL) 325 MG tablet Take 2 tablets (650 mg total) by mouth every 6 (six) hours as needed for moderate pain or fever. 10/09/21  Yes Jaynee Eagles, PA-C  carbamazepine (CARBATROL) 200 MG 12 hr capsule Take 200 mg by mouth 2 (two) times daily.   Yes [provider]  DULoxetine (CYMBALTA) 60 MG capsule Take 60 mg by mouth 2 (two) times daily.    Yes [provider]  escitalopram (LEXAPRO) 10 MG tablet Take 10 mg by mouth daily. 02/15/21  Yes [provider]  furosemide (LASIX) 40 MG tablet Take 1 tablet (40 mg total) by mouth daily. Start 11/25/2021 11/25/21 11/25/22 Yes Emokpae, Courage, MD  gabapentin (NEURONTIN) 600 MG tablet Take 0.5 tablets (300 mg total) by mouth 3 (three) times daily. Patient taking differently: Take 600 mg by mouth in the morning, at noon, in the evening, and at bedtime. 05/18/21  Yes Kathie Dike, MD  hydrOXYzine (ATARAX/VISTARIL) 25 MG tablet Take 50 mg by mouth every 6 (six) hours as needed for itching. 03/11/21  Yes [provider]  MOUNJARO 5 MG/0.5ML Pen Inject 0.5  mLs into the skin once a week. 11/23/21  Yes [provider]  NOVOLOG RELION 100 UNIT/ML injection SLIDING SCALE: BLOOD SUGAR <70: INITIATE HYPOGLYCEMIA PROTOCOL. 70-130: 0 UNITS; 130 TO 180: 4 UNITS; 180 TO 240: 8 UNITS; 241 TO 300: 10 UNITS; 301 TO 350: 12 UNITS; 351 TO 400: 16 UNITS; >400: 20 UNITS AND NOTIFY PROVIDER 10/31/21  Yes [provider]  pioglitazone (ACTOS) 15 MG tablet Take 15 mg by mouth daily. 02/12/21  Yes [provider]  prazosin (MINIPRESS) 2 MG capsule Take 2 mg by mouth at bedtime. 03/15/21  Yes [provider]  tiZANidine (ZANAFLEX) 4 MG tablet Take 4 mg by mouth every 6  (six) hours as needed for muscle spasms. 05/15/21  Yes [provider]  TRESIBA 100 UNIT/ML SOLN Inject 84 Units into the skin at bedtime. 10/31/21  Yes [provider]  traMADol (ULTRAM) 50 MG tablet Take 1 tablet (50 mg total) by mouth every 8 (eight) hours as needed. Patient not taking: Reported on 12/22/2021 11/23/21 11/23/22  Roxan Hockey, MD    Allergies   Allergies  Allergen Reactions   Ibuprofen Swelling   Red Dye     Other reaction(s): Nervous syncope   Codeine Palpitations   Nitrofurantoin Diarrhea and Nausea And Vomiting    Review of Systems  ROS  Neurologic Exam  Awake, alert, oriented Memory and concentration grossly intact Speech fluent, appropriate CN grossly intact Motor exam: Upper Extremities Deltoid Bicep Tricep Grip  Right 5/5 5/5 5/5 5/5  Left 5/5 5/5 5/5 5/5   Lower Extremities IP Quad PF DF EHL  Right 5/5 5/5 5/5 5/5 5/5  Left 5/5 5/5 5/5 5/5 5/5   Sensation grossly intact to LT  Imaging  MRI of the lumbar spine dated 12/22/2021 was personally reviewed.  This demonstrates maintenance of lumbar lordosis.  Primary finding is at L3-4 and L4-5.  At both levels there is moderate to severe central and lateral recess stenosis.  Stenosis appears worst at L4-5 related primarily to grade 1 anterolisthesis with uncovered disc as well as bilateral severe facet arthropathy.  Impression  - 61 y.o. female with relatively acute onset of right-sided back and leg pain likely related to increased activity the day before.  Thankfully, she does not have symptoms of cauda equina compression.  Given her multiple medical comorbidities, I do not think she is an appropriate surgical candidat(e.  Plan  -Would recommend continuing with medical treatment of her back and leg pain.  Can consider short course of steroids as well as a neuro modulating medications (gabapentin/Lyrica/Pamelor/etc.) -Can consider outpatient referral to pain management for possible epidural  steroid injection  I did review the situation with the patient and her daughter who is a Therapist, sports.  I did tell them that I suspect her symptoms will improve with time.  Furthermore, I did tell them that I do not feel she is an appropriate candidate for any kind of surgical intervention given her multiple medical comorbidities.  All their questions today were answered.   Consuella Lose, MD [facility]

## 2021-12-23 NOTE — Assessment & Plan Note (Signed)
-   Outpatient wound clinic follow up

## 2021-12-23 NOTE — Assessment & Plan Note (Signed)
Glucose normal to low - Continue glargine for now - Continue SS corrections, reduce dose

## 2021-12-23 NOTE — Assessment & Plan Note (Signed)
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  2.7 cm low density lesion in lower left kidney also visualized incidentally, significance unclear.  Renal US ordered in follow up but patient too obese to obtain useful images.  Given that I am not best suited to determine next courses of action based on imaging findings, rather than subject the patient to further imaging, will refer to Urology at discharge for evaluation and decisions regarding utility of further imaging.

## 2021-12-23 NOTE — Hospital Course (Addendum)
[name] is a 61 y.o. F with HTN, DM, CKD IIIb baseline 1.5 who presented with acute low back pain, radiculopathy and leg weakness.   6/1: Admitted; MRI in the ER showed thoracic disc herniation at T8/9 and ?myelopathy, transferred to [facility] for neurosurgery evaluation  6/2: Neurosurgery recommended medical treamtent only

## 2021-12-23 NOTE — Assessment & Plan Note (Signed)
CKD IV ruled out.  Baseline is around 1.5 per CE.

## 2021-12-24 LAB — GLUCOSE, CAPILLARY
Glucose-Capillary: 83 mg/dL (ref 70–99)
Glucose-Capillary: 108 mg/dL — ABNORMAL HIGH (ref 70–99)

## 2021-12-24 MED ORDER — OXYCODONE HCL 5 MG PO TABS: 5.0000 mg | ORAL_TABLET | Freq: Three times a day (TID) | ORAL | 0 refills | Status: DC | PRN

## 2021-12-24 MED ORDER — OXYCODONE HCL 5 MG PO TABS: 5.0000 mg | ORAL_TABLET | Freq: Three times a day (TID) | ORAL | 0 refills | Status: AC | PRN

## 2021-12-24 NOTE — Progress Notes (Signed)
PIV removed. Discharge instructions completed. Patient verbalized understanding of medication regimen, follow up appointments and discharge instructions. Patient belongings gathered and packed to discharge.  

## 2021-12-24 NOTE — TOC Transition Note (Signed)
Transition of Care [facility]) - CM/SW Discharge Note   Patient Details  Name: [name] MRN: [id number] Date of Birth: April 28, 1961  Transition of Care [facility]) CM/SW Contact:  Carles Collet, RN Phone Number: 12/24/2021, 10:21 AM   Clinical Narrative:   Damaris Schooner w patient over the phone to discuss DC plan.  Patient will need RW, order placed and it will be delivered shortly through [employer]. She has no preference for [facility] agency and referral accepted by [facility].  No other TOC needs identified.     Final next level of care: [place] Barriers to Discharge: No Barriers Identified   Patient Goals and CMS Choice Patient states their goals for this hospitalization and ongoing recovery are:: to go home CMS Medicare.gov Compare Post Acute Care list provided to:: Patient Choice offered to / list presented to : Patient  Discharge Placement                       Discharge Plan and Services                DME Arranged: [place] rolling DME Agency: [employer] Date DME Agency Contacted: 12/24/21 Time DME Agency Contacted: [id number] Representative spoke with at DME Agency: [place]: OT, PT [place] Agency: [place] Date [place]: 12/24/21 Time [place]: 1021 Representative spoke with at [place]: [place] Determinants of Health ([place]) Interventions     Readmission Risk Interventions     View : No data to display.

## 2021-12-24 NOTE — Plan of Care (Signed)
  Problem: Education: Goal: Ability to describe self-care measures that may prevent or decrease complications (Diabetes Survival Skills Education) will improve Outcome: Progressing   

## 2021-12-24 NOTE — Discharge Summary (Signed)
Physician Discharge Summary   Patient: [name] MRN: [id number] DOB: 1960/12/18  Admit date:     12/21/2021  Discharge date: 12/24/21  Discharge Physician: Edwin Dada   PCP: Teressa Senter, FNP     Recommendations at discharge:  Follow up with [facility] pain management division for ESI Follow up with PCP in 1 week for trial steroids Lauren [name]: Please refer to Urology for evaluation of left kidney lesion     Discharge Diagnoses: Principal Problem:   Lumbar disc disease with myelopathy Active Problems:   Type 2 diabetes mellitus ([place])   Essential hypertension   Obesity, Class III, BMI 40-49.9 (morbid obesity) ([place])   Stage 3a chronic kidney disease (CKD) ([facility])   Lesion of left native kidney   Right foot ulcer ([place])   Ambulatory dysfunction      Hospital Course: [name] is a 61 y.o. F with HTN, DM, CKD IIIb baseline 1.5 who presented with acute low back pain, radiculopathy and leg weakness.     * Lumbar disc disease with myelopathy MRI in the ER showed anterolisthesis and facet arthropathy and thoracic disc herniation at L4/5 and ?myelopathy, transferred to [facility] for neurosurgery evaluation  Neurosurgery evaluated patient and reviewed imaging and recommended medical treamtent.  She is already on neuromodulators (gabapentin, Tegretol and Cymbalta).  I favor short course opiates first, and trial epidural steroid injection.    If oxycodone not helpful this week, steroids could be initiated by a clinician who is able to monitor her glucoses with close follow up in the office.     Lesion of left native kidney Bilateral adrenal myelolipomas noted incidentally on CT are benign.    2.7 cm low density lesion in lower left kidney also visualized incidentally, significance unclear.  Renal US ordered in hospital but habitus precluded useful images.    At this point, given her mobility limitations for imaging, I would recommend  evaluation by Urology prior to further imaging, for evaluation and decisions regarding utility of further imaging.  Right foot ulcer ([place]) Patient already has arranged outpatient wound clinic follow up  Stage 3a chronic kidney disease (CKD) ([place]) CKD IV ruled out.  Baseline is around 1.5 per CE.  Obesity, Class III, BMI 40-49.9 (morbid obesity) ([place]) BMI 46             The [facility] Controlled Substances Registry was reviewed for this patient prior to discharge.   Consultants: Neurosurgery     DISCHARGE MEDICATION: Allergies as of 12/24/2021       Reactions   Ibuprofen Swelling   Red Dye    Other reaction(s): Nervous syncope   Codeine Palpitations   Nitrofurantoin Diarrhea, Nausea And Vomiting        Medication List     STOP taking these medications    traMADol 50 MG tablet Commonly known as: Ultram       TAKE these medications    acetaminophen 325 MG tablet Commonly known as: Tylenol Take 2 tablets (650 mg total) by mouth every 6 (six) hours as needed for moderate pain or fever.   carbamazepine 200 MG 12 hr capsule Commonly known as: CARBATROL Take 200 mg by mouth 2 (two) times daily.   DULoxetine 60 MG capsule Commonly known as: CYMBALTA Take 60 mg by mouth 2 (two) times daily.   escitalopram 10 MG tablet Commonly known as: LEXAPRO Take 10 mg by mouth daily.   furosemide 40 MG tablet Commonly known as: Lasix Take 1 tablet (  40 mg total) by mouth daily. Start 11/25/2021   gabapentin 600 MG tablet Commonly known as: NEURONTIN Take 0.5 tablets (300 mg total) by mouth 3 (three) times daily. What changed:  how much to take when to take this   hydrOXYzine 25 MG tablet Commonly known as: ATARAX Take 50 mg by mouth every 6 (six) hours as needed for itching.   Mounjaro 5 MG/0.5ML Pen Generic drug: tirzepatide Inject 0.5 mLs into the skin once a week.   NovoLOG ReliOn 100 UNIT/ML injection Generic drug: insulin aspart SLIDING SCALE:  BLOOD SUGAR <70: INITIATE HYPOGLYCEMIA PROTOCOL. 70-130: 0 UNITS; 130 TO 180: 4 UNITS; 180 TO 240: 8 UNITS; 241 TO 300: 10 UNITS; 301 TO 350: 12 UNITS; 351 TO 400: 16 UNITS; >400: 20 UNITS AND NOTIFY PROVIDER   oxyCODONE 5 MG immediate release tablet Commonly known as: Roxicodone Take 1 tablet (5 mg total) by mouth every 8 (eight) hours as needed for up to 5 days.   pioglitazone 15 MG tablet Commonly known as: ACTOS Take 15 mg by mouth daily.   prazosin 2 MG capsule Commonly known as: MINIPRESS Take 2 mg by mouth at bedtime.   tiZANidine 4 MG tablet Commonly known as: ZANAFLEX Take 4 mg by mouth every 6 (six) hours as needed for muscle spasms.   Tresiba 100 UNIT/ML Soln Generic drug: Insulin Degludec Inject 84 Units into the skin at bedtime.               Discharge Care Instructions  (From admission, onward)           Start     Ordered   12/24/21 0000  Discharge wound care:       Comments: No change to previous regimen   12/24/21 0935            Follow-up Information     [name], Ander Purpura, FNP Follow up.   Specialty: Family Medicine Contact information: [address] [place] [address] [phone]         Consuella Lose, MD Follow up.   Specialty: Neurosurgery Contact information: [address] [place] [address] [phone]                 Discharge Instructions     Discharge instructions   Complete by: As directed    From Dr. Nelva Bush were admitted for right leg pain Your MRI here showed stenosis of the spinal canal and nerve route as a results of grade 1 anterolisthesis with uncovered disc as well as bilateral severe facet arthropathy (basically "a bad disc and also spine arthritis")  Dr. Kathyrn Sheriff recommends pain medicines, PT and possibly steroid injections.  Call [facility] (listed below as Dr. Cleotilde Neer number in the To Do section) and let them know you  were seen by him in the hospital and he recommended "steroid injections in your back" and ask if they will set that up.  For pain: Continue your cymbalta, Tegretol, and gabapentin  Add oxycodone 5 mg up to three times per day (start low, go slow) Take oxycodone with acetaminophen 500 mg whenever you take it  Go to the wound clinic for your foot  Go see your primary doctor in 1 week   Discharge wound care:   Complete by: As directed    No change to previous regimen   Increase activity slowly   Complete by: As directed        Discharge  Exam: Filed Weights   12/21/21 2324  Weight: 122.5 kg    General: Pt is alert, awake, not in acute distress, sitting in recliner, appears uncomfortable Cardiovascular: RRR, nl S1-S2, no murmurs appreciated.   No LE edema.  Heart sounds distant. Respiratory: Normal respiratory rate and rhythm.  CTAB without rales or wheezes.Lung sounds diminished by habitus. Abdominal: Abdomen soft and non-tender.  No distension or HSM.   Neuro/Psych: Strength symmetric in upper extremities, right leg weak, mostly limited by pain.    Judgment and insight appear normal .   Condition at discharge:   The results of significant diagnostics from this hospitalization (including imaging, microbiology, ancillary and laboratory) are listed below for reference.   Imaging Studies: CT ABDOMEN PELVIS WO CONTRAST  Result Date: 12/22/2021 CLINICAL DATA:  Flank pain, kidney stone suspected EXAM: CT ABDOMEN AND PELVIS WITHOUT CONTRAST TECHNIQUE: Multidetector CT imaging of the abdomen and pelvis was performed following the standard protocol without IV contrast. RADIATION DOSE REDUCTION: This exam was performed according to the departmental dose-optimization program which includes automated exposure control, adjustment of the mA and/or kV according to patient size and/or use of iterative reconstruction technique. COMPARISON:  12/07/2009 FINDINGS: Lower chest: Old left rib fractures,  healed. Scarring in the lung bases. No effusions. Heart is normal size. Hepatobiliary: No focal liver abnormality is seen. Status post cholecystectomy. No biliary dilatation. Pancreas: No focal abnormality or ductal dilatation. Spleen: No focal abnormality.  Normal size. Adrenals/Urinary Tract: 4.4 cm fat containing lesion in the left adrenal gland compatible with myelolipoma. Small fatty lesion in the right adrenal gland measuring 10 mm also compatible with myelolipoma. Subtle low-density area in the lower pole of the left kidney measuring 2.7 cm. This previously measured 10 mm on prior study. No renal or ureteral stones. No hydronephrosis. Urinary bladder is unremarkable. Stomach/Bowel: Stomach, large and small bowel grossly unremarkable. Vascular/Lymphatic: Aortic atherosclerosis. No evidence of aneurysm or adenopathy. Reproductive: Prior hysterectomy.  No adnexal masses. Other: No free fluid or free air. Musculoskeletal: No acute bony abnormality. IMPRESSION: Bilateral adrenal myelolipomas. 2.7 cm low-density lesion in the lower pole of the left kidney cannot be characterized on this noncontrast study. This could be further characterized with renal ultrasound. No renal or ureteral stones.  No hydronephrosis. Aortic atherosclerosis. Electronically Signed   By: Rolm Baptise M.D.   On: 12/22/2021 00:48   MR LUMBAR SPINE WO CONTRAST  Result Date: 12/22/2021 CLINICAL DATA:  61 year old female with pain radiating to the right flank, down the right leg, left leg weakness. EXAM: MRI LUMBAR SPINE WITHOUT CONTRAST TECHNIQUE: Multiplanar, multisequence MR imaging of the lumbar spine was performed. No intravenous contrast was administered. COMPARISON:  CT Abdomen and Pelvis and lumbar spine CT 0020 hours today. FINDINGS: Segmentation:  Normal on the comparison. Alignment: Preserved lumbar lordosis with mild grade 1 anterolisthesis of L4 on L5, 3-4 mm. Vertebrae: No convincing marrow edema. Normal background bone marrow  signal. Intact visible sacrum and SI joints. Conus medullaris and cauda equina: Conus extends to the T12-L1 level. No lower spinal cord or conus signal abnormality. Paraspinal and other soft tissues: There is confluent soft tissue inflammation adjacent to the left L5-S1 facets best seen on series 6, image 11. No definite associated facet edema. But underlying chronic facet arthropathy, see details below. No paraspinal fluid collection. Distended urinary bladder. Stable visible abdominal viscera. Disc levels: T8-T9 vacuum disc and bulky disc herniation demonstrated on the CT 0020 hours today ( CT Abdomen and Pelvis series 2, image 19).  On these images there is a left paracentral disc protrusion at T11-T12 but no significant lower thoracic spinal stenosis. T12-L1:  Mostly anterior mild disc bulging. L1-L2: Negative except for epidural lipomatosis, which results in mild spinal stenosis. L2-L3: Circumferential disc bulge eccentric to the left. Mild to moderate epidural lipomatosis. Mild facet and ligament flavum hypertrophy. Mild to moderate spinal stenosis. Mild bilateral L2 foraminal stenosis. L3-L4: Circumferential disc bulge. Moderate epidural lipomatosis and posterior element hypertrophy. Small posteriorly situated synovial cyst on the right series 8, image 18). Moderate to severe spinal stenosis. Mild bilateral L3 foraminal stenosis. L4-L5: Grade 1 anterolisthesis. Circumferential disc/pseudo disc. Severe facet hypertrophy. Epidural lipomatosis. Severe spinal stenosis. Lateral recess stenosis greater on the right (right L5 nerve level). Foraminal disc is greater on the left. Moderate to severe left and moderate right L4 foraminal stenosis. L5-S1: Mostly far lateral disc bulging. Moderate facet hypertrophy. No facet joint fluid. Epidural lipomatosis. No significant stenosis. IMPRESSION: 1. Note the presence of moderate size thoracic disc herniation at T8-T9 on CT earlier today. Query Thoracic myelopathy. 2.  Multilevel multifactorial Lumbar Spinal Stenosis, in part related to epidural lipomatosis, is moderate or severe from L2-L3 through L4-L5 - maximal at the latter with underlying grade 1 anterolisthesis and severe facet arthropathy there. 3. Nonspecific soft tissue inflammation adjacent to chronically degenerated left side L5-S1 facets. But no marrow edema or facet joint fluid to suggest septic arthritis. Electronically Signed   By: Genevie Ann M.D.   On: 12/22/2021 07:44   US RENAL  Result Date: 12/22/2021 CLINICAL DATA:  Renal dysfunction low-density lesion seen in the left kidney in the previous CT EXAM: RENAL / URINARY TRACT ULTRASOUND COMPLETE COMPARISON:  CT done earlier today FINDINGS: Right Kidney: Renal measurements: 11.7 x 5.9 x 7.1 cm = volume: 257 mL. There is no hydronephrosis. No focal abnormality is seen in the renal cortex. Technologist noted hyperechoic focus superior to the right kidney, possibly perinephric fat. Left Kidney: Renal measurements: 12.5 x 7.8 x 7.3 cm = volume: 366.8 mL. There is no hydronephrosis. Evaluation is technically limited due to patient's body habitus. 2.7 cm low-density lesion seen in the lower pole of left kidney seen in the CT examination is not optimally imaged and not evaluated in the sonogram. Bladder: Unremarkable. Other: None. IMPRESSION: There is no hydronephrosis. 2.7 cm low-density lesion noted in the lower pole of left kidney in the previous CT is not adequately visualized for evaluation due to patient's body habitus. Follow-up contrast enhanced CT or MRI may be considered. Electronically Signed   By: Elmer Picker M.D.   On: 12/22/2021 13:10   CT L-SPINE NO CHARGE  Result Date: 12/22/2021 CLINICAL DATA:  Initial evaluation for lower back pain, right lower extremity neuropathy. EXAM: CT LUMBAR SPINE WITHOUT CONTRAST TECHNIQUE: Multidetector CT imaging of the lumbar spine was performed without intravenous contrast administration. Multiplanar CT image  reconstructions were also generated. RADIATION DOSE REDUCTION: This exam was performed according to the departmental dose-optimization program which includes automated exposure control, adjustment of the mA and/or kV according to patient size and/or use of iterative reconstruction technique. COMPARISON:  Radiograph from 11/23/2021. FINDINGS: Segmentation: Standard. Lowest well-formed disc space labeled the L5-S1 level. Alignment: 4 mm facet mediated anterolisthesis of L4 on L5. Alignment otherwise normal with preservation of the normal lumbar lordosis. Vertebrae: Vertebral body height maintained without acute or chronic fracture. Visualized sacrum and pelvis intact. SI joints symmetric and within normal limits. No discrete or worrisome osseous lesions. Paraspinal and other soft tissues:  Paraspinous soft tissues demonstrate no acute finding. Aortic atherosclerosis. Bilateral adrenal myelolipomas, better evaluated on concomitant CT of the abdomen and pelvis. Disc levels: L1-2: Negative interspace. Mild facet hypertrophy. No significant canal or foraminal stenosis. L2-3: Mild circumferential disc bulge, asymmetric to the right. Left greater than right facet hypertrophy. Resultant mild to moderate right lateral recess stenosis, descending L3 nerve root level. Central canal remains patent. Mild left with mild to moderate right L2 foraminal narrowing. L3-4: Diffuse disc bulge. Superimposed left foraminal to extraforaminal disc protrusion contacts the exiting left L3 nerve root (series 7, image 71). Mild to moderate facet hypertrophy. Resultant mild spinal stenosis. Mild to moderate bilateral L3 foraminal narrowing. L4-5: 4 mm anterolisthesis. Associated intervertebral disc space narrowing with broad posterior disc bulge. Severe bilateral facet arthrosis. Resultant severe canal with bilateral subarticular stenosis, with severe right and moderate left L4 foraminal narrowing. L5-S1: Disc bulge with endplate spurring.  Moderate bilateral facet hypertrophy. No significant spinal stenosis. Mild to moderate right with mild left L5 foraminal stenosis. IMPRESSION: 1. No acute abnormality within the lumbar spine. 2. 4 mm facet mediated anterolisthesis of L4 on L5 with resultant severe canal with bilateral subarticular stenosis, with severe right and moderate left L4 foraminal narrowing. 3. Disc bulge with facet hypertrophy at L2-3 with resultant mild to moderate right lateral recess and foraminal stenosis. 4. Left foraminal to extraforaminal disc protrusion at L3-4, potentially affecting the exiting left L3 nerve root. 5. Bilateral adrenal myelolipomas, better evaluated on concomitant CT of the abdomen and pelvis. 6. Aortic Atherosclerosis (ICD10-I70.0). Electronically Signed   By: Jeannine Boga M.D.   On: 12/22/2021 01:18   DG Foot Complete Right  Result Date: 12/22/2021 CLINICAL DATA:  Ulcer on dorsal aspect of foot. EXAM: RIGHT FOOT COMPLETE - 3+ VIEW COMPARISON:  Right foot x-ray 12/13/2021 FINDINGS: There is soft tissue swelling of the foot with ulceration over the plantar aspect of the midfoot. There is no underlying cortical erosion or periosteal reaction. There is no acute fracture or dislocation. Plantar calcaneal spur is again seen. Chronic changes of the midfoot and tarsal bones as well as tarsometatarsal joints appear unchanged from the prior examination compatible with Charcot foot. There are healed deformities of the proximal first and second metatarsals. No evidence for foreign body. Peripheral vascular calcifications are again seen. IMPRESSION: 1. Diffuse soft tissue swelling of the foot with plantar ulceration. 2. No acute bony abnormality. Electronically Signed   By: Ronney Asters M.D.   On: 12/22/2021 00:48    Microbiology: Results for orders placed or performed during the hospital encounter of 10/09/21  Urine Culture     Status: Abnormal   Collection Time: 10/09/21  3:28 PM   Specimen: Urine, Clean  Catch  Result Value Ref Range Status   Specimen Description   Final    URINE, CLEAN CATCH Performed at [facility], [address], [place], [place] [address]    Special Requests   Final    NONE Performed at [facility], [address], [place], [place] [address]    Culture (A)  Final    10,000 COLONIES/mL MULTIPLE SPECIES PRESENT, SUGGEST RECOLLECTION   Report Status 10/12/2021 FINAL  Final    Labs: CBC: Recent Labs  Lab 12/21/21 2336 12/23/21 0339  WBC 6.4 5.0  HGB 12.1 12.2  HCT 37.2 37.7  MCV 91.4 92.0  PLT 206 124   Basic Metabolic Panel: Recent Labs  Lab 12/21/21 2336 12/23/21 0339  NA 137 138  K 3.8 4.1  CL 104 107  CO2 26 25  GLUCOSE 67* 84  BUN 56* 41*  CREATININE 2.04* 1.55*  CALCIUM 8.3* 8.4*  MG  --  2.5*   Liver Function Tests: Recent Labs  Lab 12/21/21 2336  AST 21  ALT 15  ALKPHOS 123  BILITOT 1.2  PROT 6.9  ALBUMIN 3.3*   CBG: Recent Labs  Lab 12/23/21 0620 12/23/21 1206 12/23/21 1635 12/23/21 2116 12/24/21 0611  GLUCAP 78 86 118* 118* 83    Discharge time spent: approximately 40 minutes spent on discharge counseling, evaluation of patient on day of discharge, and coordination of discharge planning with nursing, social work, pharmacy and case management  Signed: Edwin Dada, MD [facility] 12/24/2021

## 2021-12-24 NOTE — Progress Notes (Signed)
Physical Therapy Treatment Patient Details Name: [name] MRN: [id number] DOB: [birth year] Today's Date: 12/24/2021   History of Present Illness Imaging studies reveal lumbar spinal stenosis on MRI as well as thoracic disc herniation at the level of T8/T9 with concern for thoracic myelopathy.    PT Comments    Patient near baseline mobility, able to perform bed mobility without assistance today, stand and transfer to recliner with walker, required min assist for transfers without walker. She declines to practice navigating steps here and feels confident she can complete the few steps she has at home with her daughters help stating she has been doing this successfully for quite some time. She is agreeable to d/c with RW for support and will consider HHPT after she discusses with her daughter. Adequate for d/c from PT standpoint.    Recommendations for follow up therapy are one component of a multi-disciplinary discharge planning process, led by the attending physician.  Recommendations may be updated based on patient status, additional functional criteria and insurance authorization.  Follow Up Recommendations  Home health PT     Assistance Recommended at Discharge Intermittent Supervision/Assistance  Patient can return home with the following A little help with walking and/or transfers;A little help with bathing/dressing/bathroom;Assistance with cooking/housework;Assist for transportation;Help with stairs or ramp for entrance   Equipment Recommendations  Rolling walker (2 wheels)    Recommendations for Other Services       Precautions / Restrictions Precautions Precautions: Fall Restrictions Weight Bearing Restrictions: No     Mobility  Bed Mobility Overal bed mobility: Needs Assistance Bed Mobility: Rolling, Sidelying to Sit Rolling: Min guard Sidelying to sit: Min guard     Sit to sidelying: Min guard General bed mobility comments: Educated on log roll technique.  Cues for sequencing. Performed without physical assistance today. Close guard for safety.    Transfers Overall transfer level: Needs assistance Equipment used: Rolling walker (2 wheels), 1 person hand held assist Transfers: Sit to/from Stand, Bed to chair/wheelchair/BSC Sit to Stand: Min assist, Min guard Stand pivot transfers: Min assist, Min guard         General transfer comment: Min assist without AD (pt reported baseline is no AD no assist,) cues for technique, shows instability. With RW instructed on use shows ability to perform at [facility] level for safety.    Ambulation/Gait               General Gait Details: Declines, pt states she takes no more than 2 steps at home   Stairs Stairs:  (Patient declines - encouraged to try, however pt states she feels comfortable navigating her steps at home with a plastic rail on the left and with her daughters "light" assistance.)           Wheelchair Mobility    Modified Rankin (Stroke Patients Only)       Balance Overall balance assessment: Needs assistance   Sitting balance-Leahy Scale: Good       Standing balance-Leahy Scale: Poor Standing balance comment: reliant on UE support                            Cognition Arousal/Alertness: Awake/alert Behavior During Therapy: WFL for tasks assessed/performed Overall Cognitive Status: Within Functional Limits for tasks assessed  Exercises General Exercises - Lower Extremity Ankle Circles/Pumps: AROM, Both, 10 reps, Seated Quad Sets: Strengthening, Both, 10 reps, Seated    General Comments General comments (skin integrity, edema, etc.): States she feels confident returning home, lives with daughter but moving into a new mobile home soon by herself. Will have assistance until then.      Pertinent Vitals/Pain Pain Assessment Pain Assessment: 0-10 Pain Score: 9  Pain Location: low back, unable to  feel into LE's due to neuropathy Pain Descriptors / Indicators: Sharp, Sore Pain Intervention(s): Limited activity within patient's tolerance, Monitored during session, Repositioned, Patient requesting pain meds-RN notified    Home Living                          Prior Function            PT Goals (current goals can now be found in the care plan section) Acute Rehab PT Goals Patient Stated Goal: Go home with medicine PT Goal Formulation: With patient Time For Goal Achievement: 01/06/22 Potential to Achieve Goals: Good Progress towards PT goals: Progressing toward goals    Frequency    Min 4X/week      PT Plan Current plan remains appropriate;Equipment recommendations need to be updated    Co-evaluation              AM-PAC PT "6 Clicks" Mobility   Outcome Measure  Help needed turning from your back to your side while in a flat bed without using bedrails?: None Help needed moving from lying on your back to sitting on the side of a flat bed without using bedrails?: None Help needed moving to and from a bed to a chair (including a wheelchair)?: A Little Help needed standing up from a chair using your arms (e.g., wheelchair or bedside chair)?: A Little Help needed to walk in hospital room?: A Lot Help needed climbing 3-5 steps with a railing? : A Lot 6 Click Score: 18    End of Session Equipment Utilized During Treatment: Gait belt Activity Tolerance: Patient tolerated treatment well;Patient limited by pain Patient left: with call bell/phone within reach;in chair Nurse Communication: Mobility status;Patient requests pain meds PT Visit Diagnosis: Other abnormalities of gait and mobility (R26.89);Pain Pain - part of body:  (back)     Time: 7001-7494 PT Time Calculation (min) (ACUTE ONLY): 16 min  Charges:  $Therapeutic Activity: 8-22 mins                     Candie Mile, PT   Ellouise Newer 12/24/2021, 9:07 AM

## 2022-01-03 DIAGNOSIS — R194 Change in bowel habit: Secondary | ICD-10-CM | POA: Diagnosis not present

## 2022-01-03 DIAGNOSIS — R1319 Other dysphagia: Secondary | ICD-10-CM | POA: Diagnosis not present

## 2022-01-03 DIAGNOSIS — Z86010 Personal history of colonic polyps: Secondary | ICD-10-CM | POA: Diagnosis not present

## 2022-01-06 DIAGNOSIS — E11610 Type 2 diabetes mellitus with diabetic neuropathic arthropathy: Secondary | ICD-10-CM | POA: Diagnosis not present

## 2022-01-06 DIAGNOSIS — Z79899 Other long term (current) drug therapy: Secondary | ICD-10-CM | POA: Diagnosis not present

## 2022-01-06 DIAGNOSIS — Z23 Encounter for immunization: Secondary | ICD-10-CM | POA: Diagnosis not present

## 2022-01-06 DIAGNOSIS — Z0000 Encounter for general adult medical examination without abnormal findings: Secondary | ICD-10-CM | POA: Diagnosis not present

## 2022-01-06 DIAGNOSIS — Z794 Long term (current) use of insulin: Secondary | ICD-10-CM | POA: Diagnosis not present

## 2022-01-06 DIAGNOSIS — E782 Mixed hyperlipidemia: Secondary | ICD-10-CM | POA: Diagnosis not present

## 2022-01-06 DIAGNOSIS — L97516 Non-pressure chronic ulcer of other part of right foot with bone involvement without evidence of necrosis: Secondary | ICD-10-CM | POA: Diagnosis not present

## 2022-01-06 DIAGNOSIS — L89893 Pressure ulcer of other site, stage 3: Secondary | ICD-10-CM | POA: Diagnosis not present

## 2022-01-06 DIAGNOSIS — E1165 Type 2 diabetes mellitus with hyperglycemia: Secondary | ICD-10-CM | POA: Diagnosis not present

## 2022-01-13 ENCOUNTER — Inpatient Hospital Stay (HOSPITAL_COMMUNITY): Payer: Medicare HMO | Admitting: Anesthesiology

## 2022-01-13 ENCOUNTER — Inpatient Hospital Stay (HOSPITAL_COMMUNITY)
Admission: EM | Admit: 2022-01-13 | Discharge: 2022-01-19 | DRG: 853 | Disposition: A | Discharge status: Home / Self Care | Payer: Medicare HMO | Attending: Internal Medicine | Admitting: Internal Medicine

## 2022-01-13 ENCOUNTER — Emergency Department (HOSPITAL_COMMUNITY): Payer: Medicare HMO

## 2022-01-13 ENCOUNTER — Encounter (HOSPITAL_COMMUNITY)
Admission: EM | Disposition: A | Discharge status: Home / Self Care | Payer: Self-pay | Source: Home / Self Care | Attending: Pulmonary Disease

## 2022-01-13 ENCOUNTER — Other Ambulatory Visit: Payer: Self-pay

## 2022-01-13 ENCOUNTER — Encounter (HOSPITAL_COMMUNITY): Payer: Self-pay

## 2022-01-13 ENCOUNTER — Inpatient Hospital Stay (HOSPITAL_COMMUNITY): Payer: Medicare HMO

## 2022-01-13 DIAGNOSIS — M5106 Intervertebral disc disorders with myelopathy, lumbar region: Secondary | ICD-10-CM | POA: Diagnosis present

## 2022-01-13 DIAGNOSIS — I129 Hypertensive chronic kidney disease with stage 1 through stage 4 chronic kidney disease, or unspecified chronic kidney disease: Secondary | ICD-10-CM | POA: Diagnosis present

## 2022-01-13 DIAGNOSIS — Z9102 Food additives allergy status: Secondary | ICD-10-CM

## 2022-01-13 DIAGNOSIS — Z452 Encounter for adjustment and management of vascular access device: Secondary | ICD-10-CM | POA: Diagnosis not present

## 2022-01-13 DIAGNOSIS — Z794 Long term (current) use of insulin: Secondary | ICD-10-CM

## 2022-01-13 DIAGNOSIS — M7989 Other specified soft tissue disorders: Secondary | ICD-10-CM | POA: Diagnosis not present

## 2022-01-13 DIAGNOSIS — Z7985 Long-term (current) use of injectable non-insulin antidiabetic drugs: Secondary | ICD-10-CM

## 2022-01-13 DIAGNOSIS — Z6841 Body Mass Index (BMI) 40.0 and over, adult: Secondary | ICD-10-CM

## 2022-01-13 DIAGNOSIS — B9562 Methicillin resistant Staphylococcus aureus infection as the cause of diseases classified elsewhere: Secondary | ICD-10-CM

## 2022-01-13 DIAGNOSIS — J9811 Atelectasis: Secondary | ICD-10-CM | POA: Diagnosis not present

## 2022-01-13 DIAGNOSIS — Z885 Allergy status to narcotic agent status: Secondary | ICD-10-CM | POA: Diagnosis not present

## 2022-01-13 DIAGNOSIS — M5408 Panniculitis affecting regions of neck and back, sacral and sacrococcygeal region: Secondary | ICD-10-CM | POA: Diagnosis not present

## 2022-01-13 DIAGNOSIS — E1122 Type 2 diabetes mellitus with diabetic chronic kidney disease: Secondary | ICD-10-CM | POA: Diagnosis present

## 2022-01-13 DIAGNOSIS — E1140 Type 2 diabetes mellitus with diabetic neuropathy, unspecified: Secondary | ICD-10-CM | POA: Diagnosis not present

## 2022-01-13 DIAGNOSIS — Z833 Family history of diabetes mellitus: Secondary | ICD-10-CM

## 2022-01-13 DIAGNOSIS — Z79899 Other long term (current) drug therapy: Secondary | ICD-10-CM

## 2022-01-13 DIAGNOSIS — A419 Sepsis, unspecified organism: Secondary | ICD-10-CM | POA: Diagnosis not present

## 2022-01-13 DIAGNOSIS — A429 Actinomycosis, unspecified: Secondary | ICD-10-CM | POA: Diagnosis not present

## 2022-01-13 DIAGNOSIS — R6520 Severe sepsis without septic shock: Secondary | ICD-10-CM | POA: Diagnosis not present

## 2022-01-13 DIAGNOSIS — N7682 Fournier disease of vagina and vulva: Secondary | ICD-10-CM | POA: Diagnosis not present

## 2022-01-13 DIAGNOSIS — R590 Localized enlarged lymph nodes: Secondary | ICD-10-CM | POA: Diagnosis not present

## 2022-01-13 DIAGNOSIS — L089 Local infection of the skin and subcutaneous tissue, unspecified: Secondary | ICD-10-CM | POA: Diagnosis not present

## 2022-01-13 DIAGNOSIS — Z8249 Family history of ischemic heart disease and other diseases of the circulatory system: Secondary | ICD-10-CM | POA: Diagnosis not present

## 2022-01-13 DIAGNOSIS — Z886 Allergy status to analgesic agent status: Secondary | ICD-10-CM | POA: Diagnosis not present

## 2022-01-13 DIAGNOSIS — Z4659 Encounter for fitting and adjustment of other gastrointestinal appliance and device: Secondary | ICD-10-CM | POA: Diagnosis not present

## 2022-01-13 DIAGNOSIS — Z993 Dependence on wheelchair: Secondary | ICD-10-CM

## 2022-01-13 DIAGNOSIS — N179 Acute kidney failure, unspecified: Secondary | ICD-10-CM | POA: Diagnosis present

## 2022-01-13 DIAGNOSIS — E871 Hypo-osmolality and hyponatremia: Secondary | ICD-10-CM | POA: Diagnosis present

## 2022-01-13 DIAGNOSIS — F32A Depression, unspecified: Secondary | ICD-10-CM | POA: Diagnosis present

## 2022-01-13 DIAGNOSIS — M726 Necrotizing fasciitis: Secondary | ICD-10-CM | POA: Diagnosis not present

## 2022-01-13 DIAGNOSIS — L02415 Cutaneous abscess of right lower limb: Secondary | ICD-10-CM | POA: Diagnosis present

## 2022-01-13 DIAGNOSIS — L97519 Non-pressure chronic ulcer of other part of right foot with unspecified severity: Secondary | ICD-10-CM | POA: Diagnosis present

## 2022-01-13 DIAGNOSIS — E1165 Type 2 diabetes mellitus with hyperglycemia: Secondary | ICD-10-CM | POA: Diagnosis present

## 2022-01-13 DIAGNOSIS — Z90710 Acquired absence of both cervix and uterus: Secondary | ICD-10-CM | POA: Diagnosis not present

## 2022-01-13 DIAGNOSIS — L98419 Non-pressure chronic ulcer of buttock with unspecified severity: Secondary | ICD-10-CM | POA: Diagnosis not present

## 2022-01-13 DIAGNOSIS — L8990 Pressure ulcer of unspecified site, unspecified stage: Secondary | ICD-10-CM | POA: Insufficient documentation

## 2022-01-13 DIAGNOSIS — Z881 Allergy status to other antibiotic agents status: Secondary | ICD-10-CM

## 2022-01-13 DIAGNOSIS — J95821 Acute postprocedural respiratory failure: Secondary | ICD-10-CM | POA: Diagnosis not present

## 2022-01-13 DIAGNOSIS — E119 Type 2 diabetes mellitus without complications: Secondary | ICD-10-CM | POA: Diagnosis not present

## 2022-01-13 DIAGNOSIS — E11621 Type 2 diabetes mellitus with foot ulcer: Secondary | ICD-10-CM | POA: Diagnosis present

## 2022-01-13 DIAGNOSIS — E11628 Type 2 diabetes mellitus with other skin complications: Secondary | ICD-10-CM | POA: Diagnosis not present

## 2022-01-13 DIAGNOSIS — N1831 Chronic kidney disease, stage 3a: Secondary | ICD-10-CM | POA: Diagnosis present

## 2022-01-13 DIAGNOSIS — E1169 Type 2 diabetes mellitus with other specified complication: Secondary | ICD-10-CM

## 2022-01-13 DIAGNOSIS — J9690 Respiratory failure, unspecified, unspecified whether with hypoxia or hypercapnia: Secondary | ICD-10-CM | POA: Diagnosis not present

## 2022-01-13 DIAGNOSIS — I96 Gangrene, not elsewhere classified: Secondary | ICD-10-CM | POA: Diagnosis not present

## 2022-01-13 HISTORY — PX: IRRIGATION AND DEBRIDEMENT BUTTOCKS: SHX6601

## 2022-01-13 LAB — CBC WITH DIFFERENTIAL/PLATELET
Abs Immature Granulocytes: 0.16 10*3/uL — ABNORMAL HIGH (ref 0.00–0.07)
Basophils Absolute: 0.1 10*3/uL (ref 0.0–0.1)
Basophils Relative: 0 %
Eosinophils Absolute: 0 10*3/uL (ref 0.0–0.5)
Eosinophils Relative: 0 %
HCT: 36.4 % (ref 36.0–46.0)
Hemoglobin: 11.8 g/dL — ABNORMAL LOW (ref 12.0–15.0)
Immature Granulocytes: 1 %
Lymphocytes Relative: 7 %
Lymphs Abs: 0.9 10*3/uL (ref 0.7–4.0)
MCH: 29.6 pg (ref 26.0–34.0)
MCHC: 32.4 g/dL (ref 30.0–36.0)
MCV: 91.2 fL (ref 80.0–100.0)
Monocytes Absolute: 1 10*3/uL (ref 0.1–1.0)
Monocytes Relative: 7 %
Neutro Abs: 11 10*3/uL — ABNORMAL HIGH (ref 1.7–7.7)
Neutrophils Relative %: 85 %
Platelets: 210 10*3/uL (ref 150–400)
RBC: 3.99 MIL/uL (ref 3.87–5.11)
RDW: 13.2 % (ref 11.5–15.5)
WBC: 13.1 10*3/uL — ABNORMAL HIGH (ref 4.0–10.5)
nRBC: 0 % (ref 0.0–0.2)

## 2022-01-13 LAB — COMPREHENSIVE METABOLIC PANEL
ALT: 19 U/L (ref 0–44)
AST: 23 U/L (ref 15–41)
Albumin: 3 g/dL — ABNORMAL LOW (ref 3.5–5.0)
Alkaline Phosphatase: 144 U/L — ABNORMAL HIGH (ref 38–126)
Anion gap: 11 (ref 5–15)
BUN: 45 mg/dL — ABNORMAL HIGH (ref 6–20)
CO2: 22 mmol/L (ref 22–32)
Calcium: 8.1 mg/dL — ABNORMAL LOW (ref 8.9–10.3)
Chloride: 95 mmol/L — ABNORMAL LOW (ref 98–111)
Creatinine, Ser: 2.28 mg/dL — ABNORMAL HIGH (ref 0.44–1.00)
GFR, Estimated: 24 mL/min — ABNORMAL LOW (ref >60)
Glucose, Bld: 310 mg/dL — ABNORMAL HIGH (ref 70–99)
Potassium: 4.3 mmol/L (ref 3.5–5.1)
Sodium: 128 mmol/L — ABNORMAL LOW (ref 135–145)
Total Bilirubin: 1.5 mg/dL — ABNORMAL HIGH (ref 0.3–1.2)
Total Protein: 7.5 g/dL (ref 6.5–8.1)

## 2022-01-13 LAB — PROTIME-INR
INR: 1.2 (ref 0.8–1.2)
Prothrombin Time: 15.1 seconds (ref 11.4–15.2)

## 2022-01-13 LAB — LACTIC ACID, PLASMA
Lactic Acid, Venous: 1.7 mmol/L (ref 0.5–1.9)
Lactic Acid, Venous: 1.4 mmol/L (ref 0.5–1.9)

## 2022-01-13 SURGERY — IRRIGATION AND DEBRIDEMENT BUTTOCKS
Anesthesia: General | Laterality: Right

## 2022-01-13 MED ORDER — PHENYLEPHRINE 80 MCG/ML (10ML) SYRINGE FOR IV PUSH (FOR BLOOD PRESSURE SUPPORT)
PREFILLED_SYRINGE | INTRAVENOUS | Status: AC
  Filled 2022-01-13: qty 20

## 2022-01-13 MED ORDER — VANCOMYCIN HCL IN DEXTROSE 1-5 GM/200ML-% IV SOLN: 1000.0000 mg | INTRAVENOUS | Status: DC

## 2022-01-13 MED ORDER — ONDANSETRON HCL 4 MG/2ML IJ SOLN
INTRAMUSCULAR | Status: DC | PRN
  Administered 2022-01-13: 4 mg via INTRAVENOUS

## 2022-01-13 MED ORDER — LACTATED RINGERS IV SOLN: INTRAVENOUS | Status: DC | PRN

## 2022-01-13 MED ORDER — 0.9 % SODIUM CHLORIDE (POUR BTL) OPTIME
TOPICAL | Status: DC | PRN
  Administered 2022-01-13: 1000 mL

## 2022-01-13 MED ORDER — PROPOFOL 10 MG/ML IV BOLUS
INTRAVENOUS | Status: DC | PRN
  Administered 2022-01-13: 60 mg via INTRAVENOUS

## 2022-01-13 MED ORDER — FENTANYL CITRATE (PF) 250 MCG/5ML IJ SOLN
INTRAMUSCULAR | Status: AC
  Filled 2022-01-13: qty 5

## 2022-01-13 MED ORDER — ENOXAPARIN SODIUM 40 MG/0.4ML IJ SOSY: 40.0000 mg | PREFILLED_SYRINGE | INTRAMUSCULAR | Status: DC

## 2022-01-13 MED ORDER — ROCURONIUM BROMIDE 10 MG/ML (PF) SYRINGE
PREFILLED_SYRINGE | INTRAVENOUS | Status: DC | PRN
  Administered 2022-01-13: 20 mg via INTRAVENOUS
  Administered 2022-01-13: 40 mg via INTRAVENOUS
  Administered 2022-01-13: 20 mg via INTRAVENOUS
  Administered 2022-01-13: 40 mg via INTRAVENOUS

## 2022-01-13 MED ORDER — POLYETHYLENE GLYCOL 3350 17 G PO PACK: 17.0000 g | PACK | Freq: Every day | ORAL | Status: DC | PRN

## 2022-01-13 MED ORDER — KETAMINE HCL 10 MG/ML IJ SOLN
INTRAMUSCULAR | Status: DC | PRN
  Administered 2022-01-13: 20 mg via INTRAVENOUS
  Administered 2022-01-13: 10 mg via INTRAVENOUS

## 2022-01-13 MED ORDER — SUCCINYLCHOLINE CHLORIDE 200 MG/10ML IV SOSY
PREFILLED_SYRINGE | INTRAVENOUS | Status: AC
  Filled 2022-01-13: qty 10

## 2022-01-13 MED ORDER — LIDOCAINE HCL (CARDIAC) PF 100 MG/5ML IV SOSY
PREFILLED_SYRINGE | INTRAVENOUS | Status: DC | PRN
  Administered 2022-01-13: 100 mg via INTRATRACHEAL

## 2022-01-13 MED ORDER — LIDOCAINE HCL (PF) 2 % IJ SOLN
INTRAMUSCULAR | Status: AC
  Filled 2022-01-13: qty 5

## 2022-01-13 MED ORDER — SUCCINYLCHOLINE CHLORIDE 200 MG/10ML IV SOSY
PREFILLED_SYRINGE | INTRAVENOUS | Status: DC | PRN
  Administered 2022-01-13: 140 mg via INTRAVENOUS

## 2022-01-13 MED ORDER — LIDOCAINE HCL (CARDIAC) PF 100 MG/5ML IV SOSY: PREFILLED_SYRINGE | INTRAVENOUS | Status: DC | PRN

## 2022-01-13 MED ORDER — ONDANSETRON HCL 4 MG/2ML IJ SOLN
INTRAMUSCULAR | Status: AC
  Filled 2022-01-13: qty 2

## 2022-01-13 MED ORDER — NOREPINEPHRINE 4 MG/250ML-% IV SOLN
INTRAVENOUS | Status: AC
  Filled 2022-01-13: qty 250

## 2022-01-13 MED ORDER — SODIUM CHLORIDE 0.9 % IV SOLN
2.0000 g | Freq: Once | INTRAVENOUS | Status: AC
  Administered 2022-01-13: 2 g via INTRAVENOUS
  Filled 2022-01-13: qty 12.5

## 2022-01-13 MED ORDER — SODIUM CHLORIDE 0.9 % IV SOLN: INTRAVENOUS | Status: DC

## 2022-01-13 MED ORDER — FENTANYL CITRATE (PF) 100 MCG/2ML IJ SOLN
INTRAMUSCULAR | Status: DC | PRN
  Administered 2022-01-13 – 2022-01-14 (×10): 50 ug via INTRAVENOUS

## 2022-01-13 MED ORDER — MIDAZOLAM HCL 2 MG/2ML IJ SOLN
INTRAMUSCULAR | Status: AC
  Filled 2022-01-13: qty 2

## 2022-01-13 MED ORDER — PROPOFOL 10 MG/ML IV BOLUS: INTRAVENOUS | Status: DC | PRN

## 2022-01-13 MED ORDER — PROPOFOL 10 MG/ML IV BOLUS
INTRAVENOUS | Status: AC
  Filled 2022-01-13: qty 20

## 2022-01-13 MED ORDER — LINEZOLID 600 MG/300ML IV SOLN
600.0000 mg | Freq: Two times a day (BID) | INTRAVENOUS | Status: DC
  Administered 2022-01-14 – 2022-01-16 (×5): 600 mg via INTRAVENOUS
  Filled 2022-01-13 (×8): qty 300

## 2022-01-13 MED ORDER — HYDROCORTISONE SOD SUC (PF) 100 MG IJ SOLR
INTRAMUSCULAR | Status: DC | PRN
  Administered 2022-01-13: 100 mg via INTRAVENOUS

## 2022-01-13 MED ORDER — SODIUM CHLORIDE 0.9 % IV BOLUS
1000.0000 mL | Freq: Once | INTRAVENOUS | Status: AC
Start: 2022-01-13 — End: 2022-01-13
  Administered 2022-01-13: 1000 mL via INTRAVENOUS

## 2022-01-13 MED ORDER — SODIUM CHLORIDE 0.9 % IV BOLUS
1000.0000 mL | Freq: Once | INTRAVENOUS | Status: AC
  Administered 2022-01-13: 1000 mL via INTRAVENOUS

## 2022-01-13 MED ORDER — HYDROCORTISONE SOD SUC (PF) 100 MG IJ SOLR
INTRAMUSCULAR | Status: AC
  Filled 2022-01-13: qty 2

## 2022-01-13 MED ORDER — METRONIDAZOLE 500 MG/100ML IV SOLN
500.0000 mg | Freq: Once | INTRAVENOUS | Status: AC
  Administered 2022-01-13: 500 mg via INTRAVENOUS
  Filled 2022-01-13: qty 100

## 2022-01-13 MED ORDER — KETAMINE HCL 50 MG/5ML IJ SOSY
PREFILLED_SYRINGE | INTRAMUSCULAR | Status: AC
  Filled 2022-01-13: qty 5

## 2022-01-13 MED ORDER — MORPHINE SULFATE (PF) 4 MG/ML IV SOLN
4.0000 mg | Freq: Once | INTRAVENOUS | Status: AC
  Administered 2022-01-13: 4 mg via INTRAVENOUS
  Filled 2022-01-13: qty 1

## 2022-01-13 MED ORDER — ROCURONIUM BROMIDE 10 MG/ML (PF) SYRINGE
PREFILLED_SYRINGE | INTRAVENOUS | Status: AC
  Filled 2022-01-13: qty 10

## 2022-01-13 MED ORDER — METOCLOPRAMIDE HCL 5 MG/ML IJ SOLN
INTRAMUSCULAR | Status: DC | PRN
  Administered 2022-01-13: 10 mg via INTRAVENOUS

## 2022-01-13 MED ORDER — VANCOMYCIN HCL 2000 MG/400ML IV SOLN
2000.0000 mg | Freq: Once | INTRAVENOUS | Status: AC
  Administered 2022-01-13: 2000 mg via INTRAVENOUS
  Filled 2022-01-13: qty 400

## 2022-01-13 MED ORDER — MIDAZOLAM HCL 5 MG/5ML IJ SOLN
INTRAMUSCULAR | Status: DC | PRN
  Administered 2022-01-13 – 2022-01-14 (×3): 2 mg via INTRAVENOUS

## 2022-01-13 MED ORDER — DOCUSATE SODIUM 100 MG PO CAPS: 100.0000 mg | ORAL_CAPSULE | Freq: Two times a day (BID) | ORAL | Status: DC | PRN

## 2022-01-13 SURGICAL SUPPLY — 30 items
CLOTH BEACON ORANGE TIMEOUT ST (SAFETY) ×1 IMPLANT
COVER LIGHT HANDLE STERIS (MISCELLANEOUS) ×2 IMPLANT
ELECT REM PT RETURN 9FT ADLT (ELECTROSURGICAL) ×2
ELECTRODE REM PT RTRN 9FT ADLT (ELECTROSURGICAL) IMPLANT
GAUZE KERLIX 2X3 DERM STRL LF (GAUZE/BANDAGES/DRESSINGS) ×2 IMPLANT
GLOVE BIOGEL M 6.5 STRL (GLOVE) ×2 IMPLANT
GLOVE BIOGEL PI IND STRL 6.5 (GLOVE) IMPLANT
GLOVE BIOGEL PI IND STRL 7.0 (GLOVE) IMPLANT
GLOVE BIOGEL PI INDICATOR 6.5 (GLOVE) ×2
GLOVE BIOGEL PI INDICATOR 7.0 (GLOVE) ×1
GOWN STRL REUS W/TWL LRG LVL3 (GOWN DISPOSABLE) ×3 IMPLANT
KIT TURNOVER KIT A (KITS) ×1 IMPLANT
MANIFOLD NEPTUNE II (INSTRUMENTS) ×1 IMPLANT
MARKER SKIN DUAL TIP RULER LAB (MISCELLANEOUS) ×1 IMPLANT
NEEDLE HYPO 22GX1.5 SAFETY (NEEDLE) ×1 IMPLANT
NS IRRIG 1000ML POUR BTL (IV SOLUTION) ×1 IMPLANT
PACK BASIC III (CUSTOM PROCEDURE TRAY) ×2
PACK SRG BSC III STRL LF ECLPS (CUSTOM PROCEDURE TRAY) IMPLANT
PAD ABD 5X9 TENDERSORB (GAUZE/BANDAGES/DRESSINGS) ×3 IMPLANT
PAD ARMBOARD 7.5X6 YLW CONV (MISCELLANEOUS) ×1 IMPLANT
PENCIL SMOKE EVACUATOR (MISCELLANEOUS) ×1 IMPLANT
SET BASIN LINEN APH (SET/KITS/TRAYS/PACK) ×1 IMPLANT
SPONGE T-LAP 18X18 ~~LOC~~+RFID (SPONGE) ×4 IMPLANT
SUT SILK 2 0 (SUTURE) ×2
SUT SILK 2-0 18XBRD TIE 12 (SUTURE) IMPLANT
SUT VIC AB 3-0 SH 27 (SUTURE) ×8
SUT VIC AB 3-0 SH 27X BRD (SUTURE) IMPLANT
SWAB CULTURE LIQ STUART DBL (MISCELLANEOUS) ×1 IMPLANT
TAPE HYPAFIX 6X30 (GAUZE/BANDAGES/DRESSINGS) ×1 IMPLANT
TRAY FOLEY MTR SLVR 16FR STAT (SET/KITS/TRAYS/PACK) ×1 IMPLANT

## 2022-01-13 NOTE — H&P (Addendum)
NAME:  [name], MRN:  [id number], DOB:  11/09/1960, LOS: 1 ADMISSION DATE:  01/13/2022, CONSULTATION DATE:  6/23 REFERRING MD:  Deatra Robinson PA, CHIEF COMPLAINT:  right thigh abscess  History of Present Illness:  Patient is intubated. Therefore history has been obtained from chart review.   [name], is a 61 y.o. female, who presented to the [facility] ED with a chief complaint of a red and painful right thigh with concern for an abscess.  They have a pertinent past medical history of DM2, CKD stage 3a, HTN, neuropathy, lumbar disc disease with myelopathy, right foot ulcer, wheelchair bound  Prior to arrival,  patient with 5 days of worsening pain to the RU thigh. HX of boil in right thigh. HX of boil requiring antibiotics. Reports of fevers, chills, and nausea at home.   ED course was notable for CT right femur and pelvis demonstrated right thigh subcutaneous fat including edema, swelling, and subcutaneous air, concerning for necrotizing soft tissue infection. General surgery was called by the [facility]. Blood cultures were obtained. The patient was started on linezolid, vanc, cefepime, and flagyl.  She underwent surgery at [facility] with Dr. Robyne Peers. She was transferred to [facility] postoperatively.  PCCM was consulted for admission  Pertinent  Medical History  DM2, CKD stage 3a, HTN, neuropathy, lumbar disc disease with myelopathy, right foot ulcer, wheelchair bound  Significant Hospital Events: Including procedures, antibiotic start and stop dates in addition to other pertinent events   6/23 presented to [facility], GS consult, CT with gas in tight thigh, Vanc/Linezolid/Cefe/Flagyl (could not order clinda), PCCM consult, [facility] txr, BC>, WC>  Interim History / Subjective:  See above  Unable to obtain subjective evaluation due to patient status  Objective   Blood pressure (!) 164/74, pulse (!) 112, temperature 98.9 F (37.2 C), resp. rate 20, height 5\' 4"  (1.626 m), weight 116.6 kg, SpO2 99  %.        Intake/Output Summary (Last 24 hours) at 01/14/2022 0127 Last data filed at 01/14/2022 0034 Gross per 24 hour  Intake 2000 ml  Output 750 ml  Net 1250 ml   Filed Weights   01/13/22 1338  Weight: 116.6 kg    Examination: General: In bed, NAD, appears comfortable, chronically ill appearing HEENT: MM pink/moist, anicteric, atraumatic Neuro: sedated, PERRL 3mm,  CV: S1S2, ST, no m/r/g appreciated PULM:  clear in the upper lobes, clear in the lower lobes, trachea midline, chest expansion symmetric GI: soft, bsx4 active, non-tender   Extremities: warm/dry, no pretibial edema, capillary refill less than 3 seconds  Skin: rt debridement site c/d/I, RT foot ulcer, L venous stasis         Labs UA pending NA 128  Glucose 310 Creat 2.28 (baseline 1.5-2.8), BUN 45, GFR 24 Alk phos 144 Albumin 3.0 Lactate 1.7>1.4 WBC 13.1, neutro 11.0 HBG 11.8 INR 1.2 Blood cultures in process Wound cultures in process  Resolved Hospital Problem list     Assessment & Plan:  Sepsis secondary to necrotizing fascitis of the right thigh S/P surgical debridement As seen on CT 6/23. Surgical debridement 6/23-24.  Wheelchair bound at baseline. HX DM. Chronic right foot wound. Leukocytosis. -Dr. Sheliah Hatch with [facility] to follow post op. Wound management per [facility]. Per OP note patient will likely need one more debridement. Unclear timing. -Continue vanc, linezolid, cefe, flagyl -Goal MAP 65.  -Follow up wound and blood cultures -LR at 75 -Trend fever/wbc curve -Fentanyl for pain control  Acute respiratory failure, post-operative Intubated for debridement. -  LTVV strategy with tidal volumes of 4-8 cc/kg ideal body weight -Goal plateau pressures less than 30 and driving pressures less than 15 -Wean PEEP/FiO2 for SpO2 92-98% -VAP bundle -Daily SAT and SBT. -PAD bundle with Propofol gtt and fentanyl gtt -RASS goal 0 to -1 -Follow up ABG  DM2 A1c 6/16 6.1 -Blood Glucose goal  140-180. -SSI, resistant  AKI on CKD stage 3a 2.7 cm low density lesion in LL kidney seen on admission 6/1-3.  -Ensure renal perfusion. Goal MAP 65 or greater. -Avoid neprotoxic drugs as possible. -Strict I&O's -Follow up AM creatinine -Needs outpatient urology follow up  Hyponatrema NA 128, unclear etilogy -No free H2O -Recheck NA level post resuscitation   HX HTN -Hold home antihypertensives overnight  Right foot ulcer -AM WOC consult  Lumbar disc disease with myelopathy -outpatient follow up with NSGY -Resume cymbalta, tegretol, gabapentin when enteral access obtained  Best Practice (right click and "Reselect all SmartList Selections" daily)   Diet/type: NPO w/ meds via tube DVT prophylaxis: prophylactic heparin  GI prophylaxis: PPI Lines: Central line, Arterial Line, and yes and it is still needed Foley:  Yes, and it is still needed Code Status:  full code Last date of multidisciplinary goals of care discussion [Pending]  Labs   CBC: Recent Labs  Lab 01/13/22 1438  WBC 13.1*  NEUTROABS 11.0*  HGB 11.8*  HCT 36.4  MCV 91.2  PLT 210    Basic Metabolic Panel: Recent Labs  Lab 01/13/22 1438  NA 128*  K 4.3  CL 95*  CO2 22  GLUCOSE 310*  BUN 45*  CREATININE 2.28*  CALCIUM 8.1*   GFR: Estimated Creatinine Clearance: 33.8 mL/min (A) (by C-G formula based on SCr of 2.28 mg/dL (H)). Recent Labs  Lab 01/13/22 1438 01/13/22 1638  WBC 13.1*  --   LATICACIDVEN 1.7 1.4    Liver Function Tests: Recent Labs  Lab 01/13/22 1438  AST 23  ALT 19  ALKPHOS 144*  BILITOT 1.5*  PROT 7.5  ALBUMIN 3.0*   No results for input(s): "LIPASE", "AMYLASE" in the last 168 hours. No results for input(s): "AMMONIA" in the last 168 hours.  ABG No results found for: "PHART", "PCO2ART", "PO2ART", "HCO3", "TCO2", "ACIDBASEDEF", "O2SAT"   Coagulation Profile: Recent Labs  Lab 01/13/22 1438  INR 1.2    Cardiac Enzymes: No results for input(s): "CKTOTAL",  "CKMB", "CKMBINDEX", "TROPONINI" in the last 168 hours.  HbA1C: Hgb A1c MFr Bld  Date/Time Value Ref Range Status  12/21/2021 11:36 PM 5.9 (H) 4.8 - 5.6 % Final    Comment:    (NOTE) Pre diabetes:          5.7%-6.4%  Diabetes:              >6.4%  Glycemic control for   <7.0% adults with diabetes   05/16/2021 03:59 PM 9.8 (H) 4.8 - 5.6 % Final    Comment:    (NOTE) Pre diabetes:          5.7%-6.4%  Diabetes:              >6.4%  Glycemic control for   <7.0% adults with diabetes     CBG: No results for input(s): "GLUCAP" in the last 168 hours.  Review of Systems:   Unable to obtain ROS due to patient status  Past Medical History:  She,  has a past medical history of Chest pain, Depression, Diabetes mellitus, Hypertension, Neuropathy, Pneumonia, and Sepsis (HCC).   Surgical History:  Past Surgical History:  Procedure Laterality Date   ABDOMINAL HYSTERECTOMY     APPENDECTOMY     CESAREAN SECTION     CHOLECYSTECTOMY     LEFT HEART CATH AND CORONARY ANGIOGRAPHY N/A 07/12/2017   Procedure: LEFT HEART CATH AND CORONARY ANGIOGRAPHY;  Surgeon: Runell Gess, MD;  Location: [facility] INVASIVE CV LAB;  Service: Cardiovascular;  Laterality: N/A;     Social History:   reports that she has never smoked. She has never used smokeless tobacco. She reports that she does not drink alcohol and does not use drugs.   Family History:  Her family history includes Alcohol abuse in her father; CAD (age of onset: 55) in her mother; Cancer in her mother; Diabetes in her father and mother; Heart failure in her mother; Hypertension in her mother.   Allergies Allergies  Allergen Reactions   Ibuprofen Swelling   Red Dye     Other reaction(s): Nervous syncope   Codeine Palpitations   Nitrofurantoin Diarrhea and Nausea And Vomiting     Home Medications  Prior to Admission medications   Medication Sig Start Date End Date Taking? Authorizing Provider  acetaminophen (TYLENOL) 325 MG tablet  Take 2 tablets (650 mg total) by mouth every 6 (six) hours as needed for moderate pain or fever. 10/09/21   Wallis Bamberg, PA-C  carbamazepine (CARBATROL) 200 MG 12 hr capsule Take 200 mg by mouth 2 (two) times daily.    [provider]  DULoxetine (CYMBALTA) 60 MG capsule Take 60 mg by mouth 2 (two) times daily.     [provider]  escitalopram (LEXAPRO) 10 MG tablet Take 10 mg by mouth daily. 02/15/21   [provider]  furosemide (LASIX) 40 MG tablet Take 1 tablet (40 mg total) by mouth daily. Start 11/25/2021 11/25/21 11/25/22  Shon Hale, MD  gabapentin (NEURONTIN) 600 MG tablet Take 0.5 tablets (300 mg total) by mouth 3 (three) times daily. Patient taking differently: Take 600 mg by mouth in the morning, at noon, in the evening, and at bedtime. 05/18/21   Erick Blinks, MD  hydrOXYzine (ATARAX/VISTARIL) 25 MG tablet Take 50 mg by mouth every 6 (six) hours as needed for itching. 03/11/21   [provider]  MOUNJARO 5 MG/0.5ML Pen Inject 0.5 mLs into the skin once a week. 11/23/21   [provider]  NOVOLOG RELION 100 UNIT/ML injection SLIDING SCALE: BLOOD SUGAR <70: INITIATE HYPOGLYCEMIA PROTOCOL. 70-130: 0 UNITS; 130 TO 180: 4 UNITS; 180 TO 240: 8 UNITS; 241 TO 300: 10 UNITS; 301 TO 350: 12 UNITS; 351 TO 400: 16 UNITS; >400: 20 UNITS AND NOTIFY PROVIDER 10/31/21   [provider]  pioglitazone (ACTOS) 15 MG tablet Take 15 mg by mouth daily. 02/12/21   [provider]  prazosin (MINIPRESS) 2 MG capsule Take 2 mg by mouth at bedtime. 03/15/21   [provider]  tiZANidine (ZANAFLEX) 4 MG tablet Take 4 mg by mouth every 6 (six) hours as needed for muscle spasms. 05/15/21   [provider]  TRESIBA 100 UNIT/ML SOLN Inject 84 Units into the skin at bedtime. 10/31/21   [provider]     Critical care time: 41 minutes    Gershon Mussel., MSN, APRN, AGACNP-BC  Pulmonary & Critical Care  01/14/2022  , 1:27 AM  Please see [email or web address] for pager details  If no response, please call [phone] After hours, please call Elink at [phone]

## 2022-01-13 NOTE — Anesthesia Preprocedure Evaluation (Addendum)
Anesthesia Evaluation  Patient identified by MRN, date of birth, ID band Patient awake    Reviewed: Allergy & Precautions, NPO status , Patient's Chart, lab work & pertinent test results  Airway Mallampati: II  TM Distance: >3 FB Neck ROM: Full    Dental  (+) Dental Advisory Given, Missing, Poor Dentition   Pulmonary pneumonia,    breath sounds clear to auscultation + decreased breath sounds      Cardiovascular Exercise Tolerance: Poor hypertension, Pt. on medications + angina  Rhythm:Regular Rate:Tachycardia     Neuro/Psych  Headaches, PSYCHIATRIC DISORDERS Depression  Neuromuscular disease (wheel chair bound)    GI/Hepatic Neg liver ROS, GERD  ,  Endo/Other  diabetes, Poorly Controlled, Type 2, Oral Hypoglycemic Agents, Insulin Dependent  Renal/GU Renal Insufficiency and CRFRenal disease  negative genitourinary   Musculoskeletal Wheelchair bound   Abdominal   Peds negative pediatric ROS (+)  Hematology negative hematology ROS (+)   Anesthesia Other Findings   Reproductive/Obstetrics negative OB ROS                            Anesthesia Physical Anesthesia Plan  ASA: 4 and emergent  Anesthesia Plan: General   Post-op Pain Management: Dilaudid IV   Induction: Intravenous  PONV Risk Score and Plan: 3 and Ondansetron  Airway Management Planned: Oral ETT  Additional Equipment:   Intra-op Plan:   Post-operative Plan: Possible Post-op intubation/ventilation  Informed Consent: I have reviewed the patients History and Physical, chart, labs and discussed the procedure including the risks, benefits and alternatives for the proposed anesthesia with the patient or authorized representative who has indicated his/her understanding and acceptance.     Dental advisory given  Plan Discussed with: Surgeon  Anesthesia Plan Comments:        Anesthesia Quick Evaluation

## 2022-01-14 ENCOUNTER — Inpatient Hospital Stay (HOSPITAL_COMMUNITY): Payer: Medicare HMO

## 2022-01-14 DIAGNOSIS — M7989 Other specified soft tissue disorders: Secondary | ICD-10-CM | POA: Diagnosis not present

## 2022-01-14 DIAGNOSIS — M726 Necrotizing fasciitis: Secondary | ICD-10-CM

## 2022-01-14 DIAGNOSIS — J95821 Acute postprocedural respiratory failure: Secondary | ICD-10-CM

## 2022-01-14 DIAGNOSIS — A419 Sepsis, unspecified organism: Secondary | ICD-10-CM

## 2022-01-14 DIAGNOSIS — L8990 Pressure ulcer of unspecified site, unspecified stage: Secondary | ICD-10-CM | POA: Insufficient documentation

## 2022-01-14 LAB — GLUCOSE, CAPILLARY
Glucose-Capillary: 168 mg/dL — ABNORMAL HIGH (ref 70–99)
Glucose-Capillary: 188 mg/dL — ABNORMAL HIGH (ref 70–99)
Glucose-Capillary: 262 mg/dL — ABNORMAL HIGH (ref 70–99)
Glucose-Capillary: 304 mg/dL — ABNORMAL HIGH (ref 70–99)
Glucose-Capillary: 320 mg/dL — ABNORMAL HIGH (ref 70–99)
Glucose-Capillary: 362 mg/dL — ABNORMAL HIGH (ref 70–99)

## 2022-01-14 LAB — POCT I-STAT 7, (LYTES, BLD GAS, ICA,H+H)
Acid-base deficit: 9 mmol/L — ABNORMAL HIGH (ref 0.0–2.0)
Bicarbonate: 18 mmol/L — ABNORMAL LOW (ref 20.0–28.0)
Calcium, Ion: 1.07 mmol/L — ABNORMAL LOW (ref 1.15–1.40)
HCT: 31 % — ABNORMAL LOW (ref 36.0–46.0)
Hemoglobin: 10.5 g/dL — ABNORMAL LOW (ref 12.0–15.0)
O2 Saturation: 92 %
Patient temperature: 98.8
Potassium: 4.9 mmol/L (ref 3.5–5.1)
Sodium: 129 mmol/L — ABNORMAL LOW (ref 135–145)
TCO2: 19 mmol/L — ABNORMAL LOW (ref 22–32)
pCO2 arterial: 40.9 mmHg (ref 32–48)
pH, Arterial: 7.252 — ABNORMAL LOW (ref 7.35–7.45)
pO2, Arterial: 74 mmHg — ABNORMAL LOW (ref 83–108)

## 2022-01-14 LAB — PHOSPHORUS: Phosphorus: 3.8 mg/dL (ref 2.5–4.6)

## 2022-01-14 LAB — LACTIC ACID, PLASMA: Lactic Acid, Venous: 0.7 mmol/L (ref 0.5–1.9)

## 2022-01-14 LAB — BASIC METABOLIC PANEL
Anion gap: 12 (ref 5–15)
BUN: 40 mg/dL — ABNORMAL HIGH (ref 6–20)
CO2: 17 mmol/L — ABNORMAL LOW (ref 22–32)
Calcium: 7.5 mg/dL — ABNORMAL LOW (ref 8.9–10.3)
Chloride: 99 mmol/L (ref 98–111)
Creatinine, Ser: 2.13 mg/dL — ABNORMAL HIGH (ref 0.44–1.00)
GFR, Estimated: 26 mL/min — ABNORMAL LOW (ref >60)
Glucose, Bld: 379 mg/dL — ABNORMAL HIGH (ref 70–99)
Potassium: 5 mmol/L (ref 3.5–5.1)
Sodium: 128 mmol/L — ABNORMAL LOW (ref 135–145)

## 2022-01-14 LAB — CBC
HCT: 32.6 % — ABNORMAL LOW (ref 36.0–46.0)
Hemoglobin: 10.3 g/dL — ABNORMAL LOW (ref 12.0–15.0)
MCH: 29.3 pg (ref 26.0–34.0)
MCHC: 31.6 g/dL (ref 30.0–36.0)
MCV: 92.6 fL (ref 80.0–100.0)
Platelets: 200 10*3/uL (ref 150–400)
RBC: 3.52 MIL/uL — ABNORMAL LOW (ref 3.87–5.11)
RDW: 13.4 % (ref 11.5–15.5)
WBC: 14.7 10*3/uL — ABNORMAL HIGH (ref 4.0–10.5)
nRBC: 0 % (ref 0.0–0.2)

## 2022-01-14 LAB — MAGNESIUM: Magnesium: 2 mg/dL (ref 1.7–2.4)

## 2022-01-14 LAB — MRSA NEXT GEN BY PCR, NASAL: MRSA by PCR Next Gen: DETECTED — AB

## 2022-01-14 MED ORDER — PANTOPRAZOLE 2 MG/ML SUSPENSION
40.0000 mg | Freq: Every day | ORAL | Status: DC
  Administered 2022-01-14 – 2022-01-15 (×2): 40 mg
  Filled 2022-01-14 (×2): qty 20

## 2022-01-14 MED ORDER — ORAL CARE MOUTH RINSE
15.0000 mL | OROMUCOSAL | Status: DC
  Administered 2022-01-14 – 2022-01-15 (×16): 15 mL via OROMUCOSAL

## 2022-01-14 MED ORDER — CALCIUM GLUCONATE-NACL 2-0.675 GM/100ML-% IV SOLN
2.0000 g | Freq: Once | INTRAVENOUS | Status: AC
  Administered 2022-01-14: 2000 mg via INTRAVENOUS
  Filled 2022-01-14: qty 100

## 2022-01-14 MED ORDER — MUPIROCIN 2 % EX OINT
1.0000 | TOPICAL_OINTMENT | Freq: Two times a day (BID) | CUTANEOUS | Status: AC
  Administered 2022-01-14 – 2022-01-18 (×10): 1 via NASAL
  Filled 2022-01-14 (×3): qty 22

## 2022-01-14 MED ORDER — CHLORHEXIDINE GLUCONATE CLOTH 2 % EX PADS
6.0000 | MEDICATED_PAD | Freq: Every day | CUTANEOUS | Status: DC
  Administered 2022-01-14 – 2022-01-19 (×5): 6 via TOPICAL

## 2022-01-14 MED ORDER — FENTANYL BOLUS VIA INFUSION
50.0000 ug | INTRAVENOUS | Status: DC | PRN
  Administered 2022-01-14 – 2022-01-15 (×11): 50 ug via INTRAVENOUS

## 2022-01-14 MED ORDER — MIDAZOLAM HCL 2 MG/2ML IJ SOLN
INTRAMUSCULAR | Status: AC
  Filled 2022-01-14: qty 2

## 2022-01-14 MED ORDER — METRONIDAZOLE 500 MG/100ML IV SOLN
500.0000 mg | Freq: Two times a day (BID) | INTRAVENOUS | Status: DC
Start: 2022-01-14 — End: 2022-01-16
  Administered 2022-01-14 – 2022-01-16 (×5): 500 mg via INTRAVENOUS
  Filled 2022-01-14 (×5): qty 100

## 2022-01-14 MED ORDER — HEPARIN SODIUM (PORCINE) 5000 UNIT/ML IJ SOLN
5000.0000 [IU] | Freq: Three times a day (TID) | INTRAMUSCULAR | Status: DC
  Administered 2022-01-14 – 2022-01-19 (×17): 5000 [IU] via SUBCUTANEOUS
  Filled 2022-01-14 (×17): qty 1

## 2022-01-14 MED ORDER — FENTANYL CITRATE (PF) 100 MCG/2ML IJ SOLN: 50.0000 ug | Freq: Once | INTRAMUSCULAR | Status: AC

## 2022-01-14 MED ORDER — PROPOFOL 1000 MG/100ML IV EMUL: 0.0000 ug/kg/min | INTRAVENOUS | Status: DC

## 2022-01-14 MED ORDER — LACTATED RINGERS IV SOLN: INTRAVENOUS | Status: AC

## 2022-01-14 MED ORDER — SODIUM CHLORIDE 0.9 % IV SOLN: INTRAVENOUS | Status: DC | PRN

## 2022-01-14 MED ORDER — INSULIN ASPART 100 UNIT/ML IJ SOLN: 1.0000 [IU] | INTRAMUSCULAR | Status: DC

## 2022-01-14 MED ORDER — PROPOFOL 1000 MG/100ML IV EMUL
INTRAVENOUS | Status: AC
  Administered 2022-01-14: 10 ug/kg/min via INTRAVENOUS
  Filled 2022-01-14: qty 100

## 2022-01-14 MED ORDER — INSULIN ASPART 100 UNIT/ML IJ SOLN
0.0000 [IU] | INTRAMUSCULAR | Status: DC
  Administered 2022-01-14: 4 [IU] via SUBCUTANEOUS
  Administered 2022-01-14: 15 [IU] via SUBCUTANEOUS
  Administered 2022-01-14: 11 [IU] via SUBCUTANEOUS
  Administered 2022-01-14: 4 [IU] via SUBCUTANEOUS
  Administered 2022-01-15 (×3): 3 [IU] via SUBCUTANEOUS
  Administered 2022-01-16 (×2): 4 [IU] via SUBCUTANEOUS
  Administered 2022-01-16 – 2022-01-17 (×3): 3 [IU] via SUBCUTANEOUS
  Administered 2022-01-17 (×3): 4 [IU] via SUBCUTANEOUS
  Administered 2022-01-17 – 2022-01-18 (×5): 3 [IU] via SUBCUTANEOUS
  Administered 2022-01-19 (×2): 4 [IU] via SUBCUTANEOUS

## 2022-01-14 MED ORDER — SODIUM CHLORIDE 0.9% FLUSH
10.0000 mL | Freq: Two times a day (BID) | INTRAVENOUS | Status: DC
  Administered 2022-01-14: 30 mL
  Administered 2022-01-14 – 2022-01-16 (×4): 10 mL
  Administered 2022-01-16: 30 mL
  Administered 2022-01-17 – 2022-01-18 (×3): 10 mL

## 2022-01-14 MED ORDER — POLYETHYLENE GLYCOL 3350 17 G PO PACK
17.0000 g | PACK | Freq: Every day | ORAL | Status: DC
  Administered 2022-01-15: 17 g
  Filled 2022-01-14: qty 1

## 2022-01-14 MED ORDER — INSULIN ASPART 100 UNIT/ML IJ SOLN
0.0000 [IU] | INTRAMUSCULAR | Status: DC
  Administered 2022-01-14: 20 [IU] via SUBCUTANEOUS

## 2022-01-14 MED ORDER — SODIUM CHLORIDE 0.9% FLUSH: 10.0000 mL | INTRAVENOUS | Status: DC | PRN

## 2022-01-14 MED ORDER — FENTANYL 2500MCG IN NS 250ML (10MCG/ML) PREMIX INFUSION
50.0000 ug/h | INTRAVENOUS | Status: DC
  Administered 2022-01-14: 150 ug/h via INTRAVENOUS
  Administered 2022-01-14: 50 ug/h via INTRAVENOUS
  Administered 2022-01-15: 175 ug/h via INTRAVENOUS
  Filled 2022-01-14 (×2): qty 250

## 2022-01-14 MED ORDER — FENTANYL 2500MCG IN NS 250ML (10MCG/ML) PREMIX INFUSION
INTRAVENOUS | Status: AC
  Filled 2022-01-14: qty 250

## 2022-01-14 MED ORDER — ORAL CARE MOUTH RINSE: 15.0000 mL | OROMUCOSAL | Status: DC | PRN

## 2022-01-14 MED ORDER — SODIUM CHLORIDE 0.9 % IV SOLN
2.0000 g | Freq: Two times a day (BID) | INTRAVENOUS | Status: DC
  Administered 2022-01-14 – 2022-01-18 (×10): 2 g via INTRAVENOUS
  Filled 2022-01-14 (×10): qty 12.5

## 2022-01-14 NOTE — Progress Notes (Signed)
Patient arrived to unit via [facility] with the following patient belongings: - Eye Glasses - Cell phone - Shirt, Pants, and a pair of blue shoes

## 2022-01-14 NOTE — Progress Notes (Signed)
Paged and spoke with On-Call General Surgery, Dr. Sheliah Hatch. Patient was not seen by surgery during rounds today, and is supposed to be rounded on tomorrow morning per day shift MD. Patient's incision is weeping yellow, and serosanguinous fluid, and was reinforced with an additional ABD pad by day shift RN. This RN asked On-call MD if any orders were wanted for repacking the incision, since no orders are currently in place. Not to repack incision tonight and wait until rounding team comes in the morning per Dr. Sheliah Hatch.

## 2022-01-14 NOTE — Brief Op Note (Addendum)
01/13/2022  12:01 AM  PATIENT:  [name]  61 y.o. female  PRE-OPERATIVE DIAGNOSIS:  necrotizing soft tissue infection  POST-OPERATIVE DIAGNOSIS:  necrotizing fascitis  PROCEDURE:  Procedure(s): IRRIGATION AND DEBRIDEMENT BUTTOCKS AND THIGH (Right)  SURGEON:  Surgeon(s) and Role:    * Elijiah Mickley A, DO - Primary  ASSISTANTS: none   ANESTHESIA:   general  EBL:  200 mL   BLOOD ADMINISTERED:none  DRAINS: none   SPECIMEN:  Source of Specimen:  Right medial thigh tissue ; Wound culture obtained  DISPOSITION OF SPECIMEN:  PATHOLOGY  COUNTS:  YES  DICTATION: .Note written in EPIC  PLAN OF CARE:  Admit to ICU at [facility]. Patient to be transferred via [facility] from PACU  PATIENT DISPOSITION:  ICU - intubated and hemodynamically stable.   Delay start of Pharmacological VTE agent (>24hrs) due to surgical blood loss or risk of bleeding: no

## 2022-01-15 DIAGNOSIS — M7989 Other specified soft tissue disorders: Secondary | ICD-10-CM | POA: Diagnosis not present

## 2022-01-15 LAB — BASIC METABOLIC PANEL
Anion gap: 13 (ref 5–15)
BUN: 51 mg/dL — ABNORMAL HIGH (ref 6–20)
CO2: 20 mmol/L — ABNORMAL LOW (ref 22–32)
Calcium: 8.2 mg/dL — ABNORMAL LOW (ref 8.9–10.3)
Chloride: 100 mmol/L (ref 98–111)
Creatinine, Ser: 2.35 mg/dL — ABNORMAL HIGH (ref 0.44–1.00)
GFR, Estimated: 23 mL/min — ABNORMAL LOW (ref >60)
Glucose, Bld: 121 mg/dL — ABNORMAL HIGH (ref 70–99)
Potassium: 4 mmol/L (ref 3.5–5.1)
Sodium: 133 mmol/L — ABNORMAL LOW (ref 135–145)

## 2022-01-15 LAB — CBC
HCT: 29.1 % — ABNORMAL LOW (ref 36.0–46.0)
Hemoglobin: 9.7 g/dL — ABNORMAL LOW (ref 12.0–15.0)
MCH: 30.4 pg (ref 26.0–34.0)
MCHC: 33.3 g/dL (ref 30.0–36.0)
MCV: 91.2 fL (ref 80.0–100.0)
Platelets: 204 10*3/uL (ref 150–400)
RBC: 3.19 MIL/uL — ABNORMAL LOW (ref 3.87–5.11)
RDW: 13.5 % (ref 11.5–15.5)
WBC: 12.7 10*3/uL — ABNORMAL HIGH (ref 4.0–10.5)
nRBC: 0 % (ref 0.0–0.2)

## 2022-01-15 LAB — GLUCOSE, CAPILLARY
Glucose-Capillary: 111 mg/dL — ABNORMAL HIGH (ref 70–99)
Glucose-Capillary: 125 mg/dL — ABNORMAL HIGH (ref 70–99)
Glucose-Capillary: 138 mg/dL — ABNORMAL HIGH (ref 70–99)
Glucose-Capillary: 115 mg/dL — ABNORMAL HIGH (ref 70–99)
Glucose-Capillary: 123 mg/dL — ABNORMAL HIGH (ref 70–99)

## 2022-01-15 LAB — TRIGLYCERIDES: Triglycerides: 219 mg/dL — ABNORMAL HIGH (ref <150)

## 2022-01-15 MED ORDER — FENTANYL CITRATE (PF) 100 MCG/2ML IJ SOLN
50.0000 ug | INTRAMUSCULAR | Status: DC | PRN
  Administered 2022-01-15 – 2022-01-16 (×8): 50 ug via INTRAVENOUS
  Filled 2022-01-15 (×8): qty 2

## 2022-01-15 MED ORDER — ORAL CARE MOUTH RINSE: 15.0000 mL | OROMUCOSAL | Status: DC | PRN

## 2022-01-15 MED ORDER — POLYETHYLENE GLYCOL 3350 17 G PO PACK
17.0000 g | PACK | Freq: Every day | ORAL | Status: DC
  Administered 2022-01-17 – 2022-01-18 (×2): 17 g via ORAL
  Filled 2022-01-15 (×3): qty 1

## 2022-01-15 MED ORDER — MEDIHONEY WOUND/BURN DRESSING EX PSTE
1.0000 | PASTE | Freq: Every day | CUTANEOUS | Status: DC
Start: 2022-01-15 — End: 2022-01-19
  Administered 2022-01-15 – 2022-01-19 (×5): 1 via TOPICAL
  Filled 2022-01-15 (×3): qty 44

## 2022-01-15 NOTE — Consult Note (Signed)
Consult Note  [name] 03/08/1961  [id number].    Requesting MD: Luciano Cutter, MD Chief Complaint/Reason for Consult: NSTI  HPI:  Patient is a 61 year old female with multiple medical comorbidities who presented to [facility] with concern for right thigh abscess. Prior to presentation this had been going on approximately 5 days with worsening pain, fever, chills and nausea. Hx of a "boil" and needing abx for this previously to the same area. Patient taken to the OR with general surgery at [facility] and debrided - debridement area measured 20 x 15 x 4 cm (300 sq cm) per Op note. Transferred to [facility] post-op for critical care. General surgery asked to consult for wound care and assessment of whether further debridement in the OR needed.   ROS: Review of Systems  Unable to perform ROS: Intubated    Family History  Problem Relation Age of Onset   Diabetes Mother    Heart failure Mother    Hypertension Mother    Cancer Mother    CAD Mother 70       Died of MI   Diabetes Father    Alcohol abuse Father     Past Medical History:  Diagnosis Date   Chest pain    a. 06/2017: cath showing normal cors with a preserved EF of 55-65%.    Depression    Diabetes mellitus    Hypertension    Neuropathy    Pneumonia    Sepsis (HCC)     Past Surgical History:  Procedure Laterality Date   ABDOMINAL HYSTERECTOMY     APPENDECTOMY     CESAREAN SECTION     CHOLECYSTECTOMY     LEFT HEART CATH AND CORONARY ANGIOGRAPHY N/A 07/12/2017   Procedure: LEFT HEART CATH AND CORONARY ANGIOGRAPHY;  Surgeon: Runell Gess, MD;  Location: [facility];  Service: Cardiovascular;  Laterality: N/A;    Social History:  reports that she has never smoked. She has never used smokeless tobacco. She reports that she does not drink alcohol and does not use drugs.  Allergies:  Allergies  Allergen Reactions   Ibuprofen Swelling   Red Dye     Other reaction(s): Nervous syncope    Codeine Palpitations   Nitrofurantoin Diarrhea and Nausea And Vomiting    Medications Prior to Admission  Medication Sig Dispense Refill   acetaminophen (TYLENOL) 325 MG tablet Take 2 tablets (650 mg total) by mouth every 6 (six) hours as needed for moderate pain or fever. 30 tablet 0   carbamazepine (CARBATROL) 200 MG 12 hr capsule Take 200 mg by mouth 2 (two) times daily.     dicyclomine (BENTYL) 20 MG tablet Take 20 mg by mouth 4 (four) times daily as needed for cramping.     DULoxetine (CYMBALTA) 60 MG capsule Take 60 mg by mouth 2 (two) times daily.      enalapril (VASOTEC) 10 MG tablet Take 10 mg by mouth daily.     escitalopram (LEXAPRO) 10 MG tablet Take 10 mg by mouth daily.     furosemide (LASIX) 40 MG tablet Take 1 tablet (40 mg total) by mouth daily. Start 11/25/2021 30 tablet 11   gabapentin (NEURONTIN) 600 MG tablet Take 0.5 tablets (300 mg total) by mouth 3 (three) times daily. (Patient taking differently: Take 600 mg by mouth in the morning, at noon, in the evening, and at bedtime.)     gentamicin cream (GARAMYCIN) 0.1 % Apply 1 Application  topically 2 (two) times daily.     hydrOXYzine (VISTARIL) 25 MG capsule Take 25-50 mg by mouth 3 (three) times daily as needed for itching.     MOUNJARO 5 MG/0.5ML Pen Inject 10 mg into the skin once a week.     NOVOLOG RELION 100 UNIT/ML injection Inject 4-20 Units into the skin 3 (three) times daily with meals. Per sliding scale:70-130; 0 units, 130 to 180; 4 units; 180-240; 8 units; 241-300 ; 10 units; 301-350; 12 units; 351-400; 16 units. > 400 20 units and call MD     pioglitazone (ACTOS) 15 MG tablet Take 15 mg by mouth daily.     prazosin (MINIPRESS) 2 MG capsule Take 2 mg by mouth at bedtime.     sulfamethoxazole-trimethoprim (BACTRIM) 400-80 MG tablet Take 1 tablet by mouth 2 (two) times daily.     tiZANidine (ZANAFLEX) 4 MG tablet Take 4 mg by mouth every 6 (six) hours as needed for muscle spasms.     insulin glargine (LANTUS  SOLOSTAR) 100 UNIT/ML Solostar Pen Inject 40 Units into the skin daily.     TRESIBA 100 UNIT/ML SOLN Inject 84 Units into the skin at bedtime. (Patient not taking: Reported on 01/14/2022)      Blood pressure (!) 116/59, pulse 80, temperature 98.6 F (37 C), temperature source Oral, resp. rate 12, height 5\' 4"  (1.626 m), weight 124.8 kg, SpO2 98 %. Physical Exam:  General: pleasant, WD, obese female who is laying in bed intubated but alert  HEENT: head is normocephalic, atraumatic.  Sclera are noninjected.   Ears and nose without any masses or lesions.   Heart: regular, rate, and rhythm.  Normal s1,s2. No obvious murmurs, gallops, or rubs noted.  Palpable radial and pedal pulses bilaterally Lungs: CTAB, no wheezes, rhonchi, or rales noted.  ventilator Abd: soft, NT, ND GU: foley present, wound as pictured below, no crepitus and minimal purulence on packing   MS: all 4 extremities are symmetrical with no cyanosis, clubbing, or edema. Skin: warm and dry with no masses, lesions, or rashes Neuro: nodding to questions and able to follow commands for me     Results for orders placed or performed during the hospital encounter of 01/13/22 (from the past 48 hour(s))  Comprehensive metabolic panel     Status: Abnormal   Collection Time: 01/13/22  2:38 PM  Result Value Ref Range   Sodium 128 (L) 135 - 145 mmol/L   Potassium 4.3 3.5 - 5.1 mmol/L   Chloride 95 (L) 98 - 111 mmol/L   CO2 22 22 - 32 mmol/L   Glucose, Bld 310 (H) 70 - 99 mg/dL    Comment: Glucose reference range applies only to samples taken after fasting for at least 8 hours.   BUN 45 (H) 6 - 20 mg/dL   Creatinine, Ser 1.61 (H) 0.44 - 1.00 mg/dL   Calcium 8.1 (L) 8.9 - 10.3 mg/dL   Total Protein 7.5 6.5 - 8.1 g/dL   Albumin 3.0 (L) 3.5 - 5.0 g/dL   AST 23 15 - 41 U/L   ALT 19 0 - 44 U/L   Alkaline Phosphatase 144 (H) 38 - 126 U/L   Total Bilirubin 1.5 (H) 0.3 - 1.2 mg/dL   GFR, Estimated 24 (L) >60 mL/min    Comment:  (NOTE) Calculated using the CKD-EPI Creatinine Equation (2021)    Anion gap 11 5 - 15    Comment: Performed at [facility], [address] [place], [place] [address]  Lactic acid, plasma     Status: None   Collection Time: 01/13/22  2:38 PM  Result Value Ref Range   Lactic Acid, Venous 1.7 0.5 - 1.9 mmol/L    Comment: Performed at [facility], [address] [place], [place] [address]  CBC with Differential     Status: Abnormal   Collection Time: 01/13/22  2:38 PM  Result Value Ref Range   WBC 13.1 (H) 4.0 - 10.5 K/uL   RBC 3.99 3.87 - 5.11 MIL/uL   Hemoglobin 11.8 (L) 12.0 - 15.0 g/dL   HCT 60.4 54.0 - 98.1 %   MCV 91.2 80.0 - 100.0 fL   MCH 29.6 26.0 - 34.0 pg   MCHC 32.4 30.0 - 36.0 g/dL   RDW 19.1 47.8 - 29.5 %   Platelets 210 150 - 400 K/uL   nRBC 0.0 0.0 - 0.2 %   Neutrophils Relative % 85 %   Neutro Abs 11.0 (H) 1.7 - 7.7 K/uL   Lymphocytes Relative 7 %   Lymphs Abs 0.9 0.7 - 4.0 K/uL   Monocytes Relative 7 %   Monocytes Absolute 1.0 0.1 - 1.0 K/uL   Eosinophils Relative 0 %   Eosinophils Absolute 0.0 0.0 - 0.5 K/uL   Basophils Relative 0 %   Basophils Absolute 0.1 0.0 - 0.1 K/uL   Immature Granulocytes 1 %   Abs Immature Granulocytes 0.16 (H) 0.00 - 0.07 K/uL    Comment: Performed at [facility], [address] [place], [place] [address]  Protime-INR     Status: None   Collection Time: 01/13/22  2:38 PM  Result Value Ref Range   Prothrombin Time 15.1 11.4 - 15.2 seconds   INR 1.2 0.8 - 1.2    Comment: (NOTE) INR goal varies based on device and disease states. Performed at [facility], [address] [place], [place] [address]   Culture, blood (Routine x 2)     Status: None (Preliminary result)   Collection Time: 01/13/22  2:53 PM   Specimen: BLOOD RIGHT HAND  Result Value Ref Range   Specimen Description      BLOOD RIGHT HAND BOTTLES DRAWN AEROBIC AND ANAEROBIC   Special Requests      Blood Culture results may not be optimal due to an excessive volume  of blood received in culture bottles   Culture      NO GROWTH 2 DAYS Performed at [facility], [address], [place], [place] [address]    Report Status PENDING   Culture, blood (Routine x 2)     Status: None (Preliminary result)   Collection Time: 01/13/22  2:55 PM   Specimen: Right Antecubital; Blood  Result Value Ref Range   Specimen Description      RIGHT ANTECUBITAL BOTTLES DRAWN AEROBIC AND ANAEROBIC   Special Requests      Blood Culture results may not be optimal due to an excessive volume of blood received in culture bottles   Culture      NO GROWTH 2 DAYS Performed at [facility], [address], [place], [place] [address]    Report Status PENDING   Lactic acid, plasma     Status: None   Collection Time: 01/13/22  4:38 PM  Result Value Ref Range   Lactic Acid, Venous 1.4 0.5 - 1.9 mmol/L    Comment: Performed at [facility], [address], [place], [place] [address]  Aerobic/Anaerobic Culture w Gram Stain (surgical/deep wound)     Status: None (  Preliminary result)   Collection Time: 01/13/22  8:30 PM   Specimen: PATH Other; Body Fluid  Result Value Ref Range   Specimen Description      THIGH Performed at [facility], [address], [place], [place] [address]    Special Requests      NONE Performed at [facility], [address] [place], [place] [address]    Gram Stain      RARE WBC PRESENT,BOTH PMN AND MONONUCLEAR ABUNDANT GRAM POSITIVE COCCI IN PAIRS ABUNDANT GRAM NEGATIVE RODS FEW GRAM POSITIVE RODS    Culture      NO GROWTH < 24 HOURS Performed at [facility], [address], [place], [place] [address]    Report Status PENDING   Glucose, capillary     Status: Abnormal   Collection Time: 01/14/22  1:25 AM  Result Value Ref Range   Glucose-Capillary 362 (H) 70 - 99 mg/dL    Comment: Glucose reference range applies only to samples taken after fasting for at least 8 hours.  MRSA Next Gen by PCR, Nasal     Status: Abnormal   Collection Time:  01/14/22  1:30 AM   Specimen: Nasal Mucosa; Nasal Swab  Result Value Ref Range   MRSA by PCR Next Gen DETECTED (A) NOT DETECTED    Comment: RESULT CALLED TO, READ BACK BY AND VERIFIED WITH: OWNLEY,RN@[id number]  01/14/22 MK (NOTE) The GeneXpert MRSA Assay (FDA approved for NASAL specimens only), is one component of a comprehensive MRSA colonization surveillance program. It is not intended to diagnose MRSA infection nor to guide or monitor treatment for MRSA infections. Test performance is not FDA approved in patients less than 74 years old. Performed at [facility], [address], [place], [place] [address]   I-STAT 7, (LYTES, BLD GAS, ICA, H+H)     Status: Abnormal   Collection Time: 01/14/22  1:38 AM  Result Value Ref Range   pH, Arterial 7.252 (L) 7.35 - 7.45   pCO2 arterial 40.9 32 - 48 mmHg   pO2, Arterial 74 (L) 83 - 108 mmHg   Bicarbonate 18.0 (L) 20.0 - 28.0 mmol/L   TCO2 19 (L) 22 - 32 mmol/L   O2 Saturation 92 %   Acid-base deficit 9.0 (H) 0.0 - 2.0 mmol/L   Sodium 129 (L) 135 - 145 mmol/L   Potassium 4.9 3.5 - 5.1 mmol/L   Calcium, Ion 1.07 (L) 1.15 - 1.40 mmol/L   HCT 31.0 (L) 36.0 - 46.0 %   Hemoglobin 10.5 (L) 12.0 - 15.0 g/dL   Patient temperature 84.6 F    Collection site Web designer by Operator    Sample type ARTERIAL   CBC     Status: Abnormal   Collection Time: 01/14/22  2:13 AM  Result Value Ref Range   WBC 14.7 (H) 4.0 - 10.5 K/uL   RBC 3.52 (L) 3.87 - 5.11 MIL/uL   Hemoglobin 10.3 (L) 12.0 - 15.0 g/dL   HCT 96.2 (L) 95.2 - 84.1 %   MCV 92.6 80.0 - 100.0 fL   MCH 29.3 26.0 - 34.0 pg   MCHC 31.6 30.0 - 36.0 g/dL   RDW 32.4 40.1 - 02.7 %   Platelets 200 150 - 400 K/uL   nRBC 0.0 0.0 - 0.2 %    Comment: Performed at [facility], [address] [place], [place] [address]  Basic metabolic panel     Status: Abnormal   Collection  Time: 01/14/22  2:13 AM  Result Value Ref Range   Sodium 128 (L) 135 - 145 mmol/L   Potassium 5.0 3.5 - 5.1  mmol/L   Chloride 99 98 - 111 mmol/L   CO2 17 (L) 22 - 32 mmol/L   Glucose, Bld 379 (H) 70 - 99 mg/dL    Comment: Glucose reference range applies only to samples taken after fasting for at least 8 hours.   BUN 40 (H) 6 - 20 mg/dL   Creatinine, Ser 4.09 (H) 0.44 - 1.00 mg/dL   Calcium 7.5 (L) 8.9 - 10.3 mg/dL   GFR, Estimated 26 (L) >60 mL/min    Comment: (NOTE) Calculated using the CKD-EPI Creatinine Equation (2021)    Anion gap 12 5 - 15    Comment: Performed at [facility], [address], [place], [place] [address]  Magnesium     Status: None   Collection Time: 01/14/22  2:13 AM  Result Value Ref Range   Magnesium 2.0 1.7 - 2.4 mg/dL    Comment: Performed at [facility], [address], [place], [place] [address]  Phosphorus     Status: None   Collection Time: 01/14/22  2:13 AM  Result Value Ref Range   Phosphorus 3.8 2.5 - 4.6 mg/dL    Comment: Performed at [facility], [address], [place], [place] [address]  Lactic acid, plasma     Status: None   Collection Time: 01/14/22  2:13 AM  Result Value Ref Range   Lactic Acid, Venous 0.7 0.5 - 1.9 mmol/L    Comment: Performed at [facility], [address] [place], [place] [address]  Glucose, capillary     Status: Abnormal   Collection Time: 01/14/22  7:53 AM  Result Value Ref Range   Glucose-Capillary 320 (H) 70 - 99 mg/dL    Comment: Glucose reference range applies only to samples taken after fasting for at least 8 hours.  Glucose, capillary     Status: Abnormal   Collection Time: 01/14/22 10:40 AM  Result Value Ref Range   Glucose-Capillary 304 (H) 70 - 99 mg/dL    Comment: Glucose reference range applies only to samples taken after fasting for at least 8 hours.  Glucose, capillary     Status: Abnormal   Collection Time: 01/14/22  4:02 PM  Result Value Ref Range   Glucose-Capillary 262 (H) 70 - 99 mg/dL    Comment: Glucose reference range applies only to samples taken after fasting for at  least 8 hours.  Glucose, capillary     Status: Abnormal   Collection Time: 01/14/22  7:54 PM  Result Value Ref Range   Glucose-Capillary 188 (H) 70 - 99 mg/dL    Comment: Glucose reference range applies only to samples taken after fasting for at least 8 hours.  Glucose, capillary     Status: Abnormal   Collection Time: 01/14/22 11:32 PM  Result Value Ref Range   Glucose-Capillary 168 (H) 70 - 99 mg/dL    Comment: Glucose reference range applies only to samples taken after fasting for at least 8 hours.  Triglycerides     Status: Abnormal   Collection Time: 01/15/22  3:07 AM  Result Value Ref Range   Triglycerides 219 (H) <150 mg/dL    Comment: Performed at [facility], [address] [place], [place] [address]  CBC     Status: Abnormal   Collection Time: 01/15/22  3:07 AM  Result Value Ref Range   WBC 12.7 (H) 4.0 - 10.5 K/uL   RBC 3.19 (L) 3.87 - 5.11 MIL/uL   Hemoglobin 9.7 (L) 12.0 - 15.0 g/dL   HCT 62.9 (L) 52.8 - 41.3 %   MCV 91.2 80.0 - 100.0 fL   MCH 30.4 26.0 - 34.0 pg   MCHC 33.3 30.0 - 36.0 g/dL   RDW 24.4 01.0 - 27.2 %   Platelets 204 150 - 400 K/uL   nRBC 0.0 0.0 - 0.2 %    Comment: Performed at [facility], [address], [place], [place] [address]  Basic metabolic panel     Status: Abnormal   Collection Time: 01/15/22  3:07 AM  Result Value Ref Range   Sodium 133 (L) 135 - 145 mmol/L   Potassium 4.0 3.5 - 5.1 mmol/L    Comment: DELTA CHECK NOTED   Chloride 100 98 - 111 mmol/L   CO2 20 (L) 22 - 32 mmol/L   Glucose, Bld 121 (H) 70 - 99 mg/dL    Comment: Glucose reference range applies only to samples taken after fasting for at least 8 hours.   BUN 51 (H) 6 - 20 mg/dL   Creatinine, Ser 4.03 (H) 0.44 - 1.00 mg/dL   Calcium 8.2 (L) 8.9 - 10.3 mg/dL   GFR, Estimated 23 (L) >60 mL/min    Comment: (NOTE) Calculated using the CKD-EPI Creatinine Equation (2021)    Anion gap 13 5 - 15    Comment: Performed at [facility], [address]  [place], [place] [address]  Glucose, capillary     Status: Abnormal   Collection Time: 01/15/22  3:39 AM  Result Value Ref Range   Glucose-Capillary 111 (H) 70 - 99 mg/dL    Comment: Glucose reference range applies only to samples taken after fasting for at least 8 hours.  Glucose, capillary     Status: Abnormal   Collection Time: 01/15/22  7:54 AM  Result Value Ref Range   Glucose-Capillary 125 (H) 70 - 99 mg/dL    Comment: Glucose reference range applies only to samples taken after fasting for at least 8 hours.   Comment 1 Notify RN    DG Abd Portable 1V  Result Date: 01/14/2022 CLINICAL DATA:  Check gastric catheter placement EXAM: PORTABLE ABDOMEN - 1 VIEW COMPARISON:  None Available. FINDINGS: Gastric catheter is noted within the stomach. Scattered large and small bowel gas is noted. IMPRESSION: Gastric catheter within the stomach. Electronically Signed   By: Alcide Clever M.D.   On: 01/14/2022 02:17   DG CHEST PORT 1 VIEW  Result Date: 01/14/2022 CLINICAL DATA:  Respiratory failure EXAM: PORTABLE CHEST 1 VIEW COMPARISON:  Film from the previous day. FINDINGS: Cardiac shadow is stable. Bibasilar atelectatic changes are noted secondary to a poor inspiratory effort. Endotracheal tube and gastric catheter are now seen in satisfactory position. Stable right jugular central line is noted. No bony abnormality is seen. IMPRESSION: Tubes and lines in satisfactory position. Bibasilar atelectatic changes related to a poor inspiratory effort. Electronically Signed   By: Alcide Clever M.D.   On: 01/14/2022 02:17   DG Chest 1 View  Result Date: 01/13/2022 CLINICAL DATA:  Check central line placement EXAM: PORTABLE CHEST 1 VIEW COMPARISON:  07/11/2017 FINDINGS: Right jugular central line is noted with the catheter tip at the cavoatrial junction. Cardiac shadow is within normal limits. The overall inspiratory effort is poor. No pneumothorax is seen. No acute bony abnormality  is noted. IMPRESSION: No  pneumothorax following central line placement. Electronically Signed   By: Alcide Clever M.D.   On: 01/13/2022 21:41   CT FEMUR RIGHT WO CONTRAST  Result Date: 01/13/2022 CLINICAL DATA:  Soft tissue infection suspected of the thigh. No prior imaging. Patient reports abscess on inner right thigh. EXAM: CT OF THE LOWER RIGHT EXTREMITY WITHOUT CONTRAST TECHNIQUE: Multidetector CT imaging of the right lower extremity was performed according to the standard protocol. RADIATION DOSE REDUCTION: This exam was performed according to the departmental dose-optimization program which includes automated exposure control, adjustment of the mA and/or kV according to patient size and/or use of iterative reconstruction technique. COMPARISON:  CT abdomen and pelvis 12/22/2021 FINDINGS: Bones/Joint/Cartilage There is moderate chronic enthesopathic change at the gluteus minimus and gluteus medius insertions on the greater trochanter of the proximal right femur. Mild-to-moderate left femoroacetabular joint space narrowing. Minimal patellofemoral joint space narrowing. Minimal peripheral patellar degenerative spurring. Moderate medial compartment of the knee joint space narrowing with mild peripheral degenerative osteophytes. Mild pubic symphysis joint space narrowing and subchondral sclerosis degenerative change. Ligaments Suboptimally assessed by CT. Muscles and Tendons There is mild edema around the gracilis musculature likely related to the more medial thigh inflammatory changes. Normal density in size of the right thigh musculature. Soft tissues No knee joint effusion. Moderate to high-grade atherosclerotic calcifications. There is edema and swelling of the posteromedial right thigh diffusely. This is greatest at the region of the proximal posteromedial right thigh and buttock subcutaneous fat, just posteromedial to the proximal gracilis musculature (axial series 13, images 71 through 120). There is scattered air throughout the  subcutaneous fat lobules and edema in this region. No well-defined walled-off border of an abscess or other fluid collection, however the general region of inflammation and subcutaneous air measures up to approximately 5.3 cm in greatest transverse dimension, 9.1 cm in greatest AP dimension (as measured on axial series 13, image 93), and 9.6 cm in craniocaudal dimension (as measured on coronal images). The visualized urinary bladder is unremarkable. IMPRESSION: 1. There are inflammatory changes within the right buttock and adjacent posteromedial right thigh subcutaneous fat including edema swelling, and subcutaneous air. This is suspicious for a gas producing infectious organism an infectious cellulitis. No walled-off abscess is seen. 2. No CT evidence of osteomyelitis. Electronically Signed   By: Neita Garnet M.D.   On: 01/13/2022 17:47   CT PELVIS WO CONTRAST  Result Date: 01/13/2022 CLINICAL DATA:  Soft tissue infection. EXAM: CT PELVIS WITHOUT CONTRAST TECHNIQUE: Multidetector CT imaging of the pelvis was performed following the standard protocol without intravenous contrast. RADIATION DOSE REDUCTION: This exam was performed according to the departmental dose-optimization program which includes automated exposure control, adjustment of the mA and/or kV according to patient size and/or use of iterative reconstruction technique. COMPARISON:  None Available. FINDINGS: Urinary Tract:  No abnormality visualized. Bowel:  No acute findings.  There are scattered colonic diverticula. Vascular/Lymphatic: There are atherosclerotic calcifications of aorta and iliac arteries. Collateral vessels are seen in the subcutaneous tissues of the anterior abdominal wall. There are enlarged left inguinal lymph nodes measuring up to 12 mm short axis. There are nonenlarged pelvic and right inguinal lymph nodes. Reproductive:  Status post hysterectomy.  Adnexa are unremarkable. Other:  There is no ascites or focal abdominal wall  hernia. Musculoskeletal: There is superficial and deep subcutaneous edema and air identified in the right gluteal region inferiorly. No focal fluid collection identified. Deep fat planes appear preserved. No acute osseous findings.  IMPRESSION: 1. Superficial and deep subcutaneous edema and air in the inferior right gluteal region concerning for infection/cellulitis. No focal abscess identified. Electronically Signed   By: Darliss Cheney M.D.   On: 01/13/2022 17:44      Assessment/Plan NSTI of R groin S/P Debridement of right medical thing and buttock 01/14/22 Dr. Robyne Peers - WBC 12.7, afebrile, not on pressors - Cxs pending - currently on linezolid, flagyl, cefepime - tolerated dressing change well at bedside - recommend BID wet to dry dressing or change PRN for soiling with BM - I do not think this patient needs to return to the OR today but general surgery will continue to follow closely   FEN: NPO, IVF per CCM - ok to have a diet from surgical standpoint if extubated  VTE: SQH ID: [id number]/cefepime/flagyl Foley: present, would continue for now to try to keep urine away from wound   - below per primary -  Morbid obesity - BMI 47 T2DM CKD stage IIIa HTN Peripheral neuropathy Lumbar disc disease with myelopathy R foot ulcer Wheelchair bound at baseline   I reviewed [facility] general surgery notes, last 24 h vitals and pain scores, last 48 h intake and output, last 24 h labs and trends, last 24 h imaging results, and CCM notes .     Juliet Rude, [facility] 01/15/2022, 10:34 AM Please see Amion for pager number during day hours 7:00am-4:30pm

## 2022-01-15 NOTE — Consult Note (Signed)
WOC Nurse Consult Note: Reason for Consult:Chronic, nonhealing right foot ulcer, full thickness. Foot with Charcot deformity Wound type:neuropathic Pressure Injury POA: N/A Measurement:2.5cm x 2cm x 0.3cm Wound bed:red, moist Drainage (amount, consistency, odor) scant Periwound:with discoloration consistent with chronic ulceration Dressing procedure/placement/frequency:I have placed conservative guidance for topical care for Nursing in the care of this wound using MediHoney, an antimicrobial to be applied once daily. Bilateral heels are to be floated for PI prevention using Prevalon boots.  WOC nursing team will not follow, but will remain available to this patient, the nursing and medical teams.  Please re-consult if needed.  Thank you for inviting [place] to participate in this patient's Plan of Care.  Ladona Mow, MSN, RN, CNS, GNP, Leda Min, [employer], [employer] phone:  [phone]

## 2022-01-15 NOTE — Progress Notes (Addendum)
NAME:  [name], MRN:  [id number], DOB:  March 13, 1961, LOS: 2 ADMISSION DATE:  01/13/2022, CONSULTATION DATE:  6/23 REFERRING MD:  Deatra Robinson PA, CHIEF COMPLAINT:  right thigh abscess  History of Present Illness:  Patient is intubated. Therefore history has been obtained from chart review.   [name], is a 61 y.o. female, who presented to the [facility] ED with a chief complaint of a red and painful right thigh with concern for an abscess.  They have a pertinent past medical history of DM2, CKD stage 3a, HTN, neuropathy, lumbar disc disease with myelopathy, right foot ulcer, wheelchair bound  Prior to arrival,  patient with 5 days of worsening pain to the RU thigh. HX of boil in right thigh. HX of boil requiring antibiotics. Reports of fevers, chills, and nausea at home.   ED course was notable for CT right femur and pelvis demonstrated right thigh subcutaneous fat including edema, swelling, and subcutaneous air, concerning for necrotizing soft tissue infection. General surgery was called by the [facility]. Blood cultures were obtained. The patient was started on linezolid, vanc, cefepime, and flagyl.  She underwent surgery at [facility] with Dr. Robyne Peers. She was transferred to [facility] postoperatively.  PCCM was consulted for admission  Pertinent  Medical History  DM2, CKD stage 3a, HTN, neuropathy, lumbar disc disease with myelopathy, right foot ulcer, wheelchair bound  Significant Hospital Events: Including procedures, antibiotic start and stop dates in addition to other pertinent events   6/23 presented to [facility], GS consult, CT with gas in right thigh, Vanc/Linezolid/Cefe/Flagyl (could not order clinda), PCCM consult, [facility] txr, BC>, WC>[facility], GNR, GPR  Interim History / Subjective:  Remains intubated for pending OR Denies pain  Objective   Blood pressure (!) 102/46, pulse 89, temperature 98.6 F (37 C), temperature source Oral, resp. rate 16, height 5\' 4"  (1.626 m), weight 124.8 kg, SpO2 96  %.    Vent Mode: PSV;CPAP FiO2 (%):  [40 %] 40 % Set Rate:  [18 bmp] 18 bmp Vt Set:  [440 mL] 440 mL PEEP:  [5 cmH20] 5 cmH20 Pressure Support:  [5 cmH20] 5 cmH20 Plateau Pressure:  [11 cmH20-24 cmH20] 17 cmH20   Intake/Output Summary (Last 24 hours) at 01/15/2022 1004 Last data filed at 01/15/2022 0800 Gross per 24 hour  Intake 2452.05 ml  Output 1150 ml  Net 1302.05 ml   Filed Weights   01/13/22 1338 01/14/22 0500 01/15/22 0355  Weight: 116.6 kg 120 kg 124.8 kg   Physical Exam: General: Well-appearing, obese, no acute distress HENT: [place], AT, ETT in place Eyes: EOMI, no scleral icterus Respiratory: Clear to auscultation bilaterally.  No crackles, wheezing or rales Cardiovascular: RRR, -M/R/G, no JVD GI: BS+, soft, nontender Extremities:-Edema,-tenderness Neuro: Awake and alert, CNII-XII grossly intact Psych: Normal mood GU: Dressing in place, foley in place  Skin:          Labs WBC improving 12 Na improved 133 Cr slightly increased to 2.35 Hg stable 9.7  Resolved Hospital Problem list   Hyponatremia  Assessment & Plan:  Sepsis secondary to necrotizing fascitis of the right thigh S/P surgical debridement  As seen on CT 6/23. Surgical debridement 6/23-24.  Wheelchair bound at baseline. HX DM. Chronic right foot wound. Leukocytosis. -Dr. Sheliah Hatch with [facility] to follow post op. Wound management per [facility]. Per OP note patient will likely need one more debridement. Unclear timing. -Post-op management per Surgery -Off pressors -Continue antibiotics: linezolid, cefe, flagyl -Follow up wound and blood cultures -Fentanyl gtt for  pain control  Acute respiratory failure, post-operative Intubated for debridement. Discussed with Surgery. No plan for OR immediate. Ok to extubate -Currently on vent on PS -SBT with plan to extubate -PAD bundle with Propofol gtt and fentanyl gtt -RASS goal 0 to -1  DM2 A1c 6/16 6.1 -Blood Glucose goal 140-180. -resistant SSI  AKI on  CKD stage 3a 2.7 cm low density lesion in LL kidney seen on admission 6/1-3.  -Ensure renal perfusion. Goal MAP 65 or greater. -Avoid neprotoxic drugs as possible. -Strict I&O's -Monitor UOP/CR -Needs outpatient urology follow up  HX HTN -Hold home antihypertensives overnight  Right foot ulcer -WOC following  Lumbar disc disease with myelopathy -outpatient follow up with NSGY -Resume cymbalta, tegretol, gabapentin when enteral access obtained  Best Practice (right click and "Reselect all SmartList Selections" daily)   Diet/type: NPO w/ meds via tube DVT prophylaxis: prophylactic heparin  GI prophylaxis: PPI Lines: Central line and yes and it is still needed Foley:  Yes, and it is still needed Code Status:  full code Last date of multidisciplinary goals of care discussion [6/24] Updated daughter at bedside   Critical care time: 50 minutes    The patient is critically ill with multiple organ systems failure and requires high complexity decision making for assessment and support, frequent evaluation and titration of therapies, application of advanced monitoring technologies and extensive interpretation of multiple databases.  Independent Critical Care Time: 50 Minutes.   Mechele Collin, M.D. [facility] Pulmonary/Critical Care Medicine 01/15/2022 10:05 AM   Please see Amion for pager number to reach on-call Pulmonary and Critical Care Team.

## 2022-01-16 DIAGNOSIS — M7989 Other specified soft tissue disorders: Secondary | ICD-10-CM | POA: Diagnosis not present

## 2022-01-16 LAB — CBC
HCT: 27.5 % — ABNORMAL LOW (ref 36.0–46.0)
Hemoglobin: 8.7 g/dL — ABNORMAL LOW (ref 12.0–15.0)
MCH: 29 pg (ref 26.0–34.0)
MCHC: 31.6 g/dL (ref 30.0–36.0)
MCV: 91.7 fL (ref 80.0–100.0)
Platelets: 205 10*3/uL (ref 150–400)
RBC: 3 MIL/uL — ABNORMAL LOW (ref 3.87–5.11)
RDW: 13.7 % (ref 11.5–15.5)
WBC: 9 10*3/uL (ref 4.0–10.5)
nRBC: 0 % (ref 0.0–0.2)

## 2022-01-16 LAB — BASIC METABOLIC PANEL
Anion gap: 9 (ref 5–15)
BUN: 48 mg/dL — ABNORMAL HIGH (ref 6–20)
CO2: 22 mmol/L (ref 22–32)
Calcium: 7.8 mg/dL — ABNORMAL LOW (ref 8.9–10.3)
Chloride: 103 mmol/L (ref 98–111)
Creatinine, Ser: 1.99 mg/dL — ABNORMAL HIGH (ref 0.44–1.00)
GFR, Estimated: 29 mL/min — ABNORMAL LOW (ref >60)
Glucose, Bld: 127 mg/dL — ABNORMAL HIGH (ref 70–99)
Potassium: 3.8 mmol/L (ref 3.5–5.1)
Sodium: 134 mmol/L — ABNORMAL LOW (ref 135–145)

## 2022-01-16 LAB — GLUCOSE, CAPILLARY
Glucose-Capillary: 107 mg/dL — ABNORMAL HIGH (ref 70–99)
Glucose-Capillary: 117 mg/dL — ABNORMAL HIGH (ref 70–99)
Glucose-Capillary: 134 mg/dL — ABNORMAL HIGH (ref 70–99)
Glucose-Capillary: 148 mg/dL — ABNORMAL HIGH (ref 70–99)
Glucose-Capillary: 152 mg/dL — ABNORMAL HIGH (ref 70–99)
Glucose-Capillary: 186 mg/dL — ABNORMAL HIGH (ref 70–99)

## 2022-01-16 MED ORDER — GABAPENTIN 300 MG PO CAPS
300.0000 mg | ORAL_CAPSULE | Freq: Two times a day (BID) | ORAL | Status: DC
  Administered 2022-01-16 – 2022-01-19 (×6): 300 mg via ORAL
  Filled 2022-01-16 (×6): qty 1

## 2022-01-16 MED ORDER — DULOXETINE HCL 30 MG PO CPEP: 60.0000 mg | ORAL_CAPSULE | Freq: Two times a day (BID) | ORAL | Status: DC

## 2022-01-16 MED ORDER — ESCITALOPRAM OXALATE 10 MG PO TABS
10.0000 mg | ORAL_TABLET | Freq: Every day | ORAL | Status: DC
  Administered 2022-01-16 – 2022-01-19 (×4): 10 mg via ORAL
  Filled 2022-01-16 (×4): qty 1

## 2022-01-16 MED ORDER — METRONIDAZOLE 500 MG PO TABS
500.0000 mg | ORAL_TABLET | Freq: Two times a day (BID) | ORAL | Status: DC
Start: 2022-01-16 — End: 2022-01-18
  Administered 2022-01-16 – 2022-01-18 (×4): 500 mg via ORAL
  Filled 2022-01-16 (×4): qty 1

## 2022-01-16 MED ORDER — PHENOL 1.4 % MT LIQD
1.0000 | OROMUCOSAL | Status: DC | PRN
  Administered 2022-01-16: 1 via OROMUCOSAL
  Filled 2022-01-16: qty 177

## 2022-01-16 MED ORDER — FENTANYL CITRATE PF 50 MCG/ML IJ SOSY
50.0000 ug | PREFILLED_SYRINGE | INTRAMUSCULAR | Status: DC | PRN
  Administered 2022-01-16 – 2022-01-19 (×5): 50 ug via INTRAVENOUS
  Filled 2022-01-16 (×7): qty 1

## 2022-01-16 MED ORDER — LINEZOLID 600 MG PO TABS
600.0000 mg | ORAL_TABLET | Freq: Two times a day (BID) | ORAL | Status: DC
  Administered 2022-01-16 – 2022-01-17 (×2): 600 mg via ORAL
  Filled 2022-01-16 (×4): qty 1

## 2022-01-16 MED ORDER — OXYCODONE-ACETAMINOPHEN 5-325 MG PO TABS
1.0000 | ORAL_TABLET | ORAL | Status: AC | PRN
  Administered 2022-01-16 – 2022-01-18 (×8): 1 via ORAL
  Filled 2022-01-16 (×9): qty 1

## 2022-01-16 NOTE — Progress Notes (Signed)
Progress note  Managing to mobilize well despite R thigh wound and pain.    01/16/22 1222  PT Visit Information  Last PT Received On 01/16/22  Assistance Needed +1  History of Present Illness pt is a 61 y/o female presenting 6/23 with pain in her R thigh from boild PTA which had been treated with antibiotics.  In the ED, she had a CT scan which showed suspected necrotizing fascitis, s/p debridement of the right medial thigh and buttock, 6/24, respiratory complication post op with intubation.  Extubated 6/25.   PMHx:  DM, HTN, neuropathy, L heart cath and coronary angiography  Precautions  Precautions Fall  Pain Assessment  Pain Assessment Faces  Faces Pain Scale 8  Pain Location R posterior/mediat thigh/buttock  Pain Descriptors / Indicators Burning;Sore  Pain Intervention(s) Limited activity within patient's tolerance;Monitored during session;Repositioned  Cognition  Behavior During Therapy [facility] for tasks assessed/performed  Overall Cognitive Status Within Functional Limits for tasks assessed  Bed Mobility  Overal bed mobility Needs Assistance  Bed Mobility Sit to Sidelying  Sit to sidelying Mod assist  Transfers  Overall transfer level Needs assistance  Equipment used 1 person hand held assist  Transfers Sit to/from Stand;Bed to chair/wheelchair/BSC  Sit to Stand Min assist  Stand pivot transfers Min assist  Ambulation/Gait  General Gait Details NT  Balance  Overall balance assessment Needs assistance  Sitting balance-Leahy Scale Good  Standing balance support Single extremity supported;Bilateral upper extremity supported  Standing balance-Leahy Scale Poor  Standing balance comment reliant on UE support, pt stood x 1+ min for pericare and uncovering soiled wound dressing.  General Comments  General comments (skin integrity, edema, etc.) vss  PT - End of Session  Activity Tolerance Patient tolerated treatment well;Patient limited by pain  Patient left in bed;with call  bell/phone within reach;with bed alarm set;with nursing/sitter in room  Nurse Communication Mobility status   PT - Assessment/Plan  PT Plan Current plan remains appropriate  PT Visit Diagnosis Other abnormalities of gait and mobility (R26.89);Pain  Pain - part of body  (buttock/ R thigh)  PT Frequency (ACUTE ONLY) Min 3X/week  Follow Up Recommendations Home health PT  Assistance recommended at discharge Intermittent Supervision/Assistance  Patient can return home with the following A little help with walking and/or transfers;A little help with bathing/dressing/bathroom;Assistance with cooking/housework;Assist for transportation;Help with stairs or ramp for entrance  PT equipment None recommended by PT  AM-PAC PT "6 Clicks" Mobility Outcome Measure (Version 2)  Help needed turning from your back to your side while in a flat bed without using bedrails? 3  Help needed moving from lying on your back to sitting on the side of a flat bed without using bedrails? 3  Help needed moving to and from a bed to a chair (including a wheelchair)? 3  Help needed standing up from a chair using your arms (e.g., wheelchair or bedside chair)? 3  Help needed to walk in hospital room? 3  Help needed climbing 3-5 steps with a railing?  2  6 Click Score 17  Consider Recommendation of Discharge To: Home with [facility]  Progressive Mobility  What is the highest level of mobility based on the progressive mobility assessment? Level 3 (Stands with assist) - Balance while standing  and cannot march in place  Activity Transferred to/from [facility]  PT Goal Progression  Progress towards PT goals Progressing toward goals  Acute Rehab PT Goals  PT Goal Formulation With patient  Time For Goal Achievement 01/30/22  Potential  to Achieve Goals Good  PT Time Calculation  PT Start Time (ACUTE ONLY) 1140  PT Stop Time (ACUTE ONLY) 1200  PT Time Calculation (min) (ACUTE ONLY) 20 min  PT General Charges  $$ ACUTE PT VISIT 1 Visit  PT  Treatments  $Therapeutic Activity 8-22 mins   01/16/2022  Jacinto Halim., PT Acute Rehabilitation Services [phone]  (pager) [phone]  (office)

## 2022-01-17 ENCOUNTER — Encounter (HOSPITAL_COMMUNITY): Payer: Self-pay | Admitting: Surgery

## 2022-01-17 ENCOUNTER — Inpatient Hospital Stay (HOSPITAL_COMMUNITY): Payer: Medicare HMO

## 2022-01-17 DIAGNOSIS — M7989 Other specified soft tissue disorders: Secondary | ICD-10-CM | POA: Diagnosis not present

## 2022-01-17 LAB — COMPREHENSIVE METABOLIC PANEL
ALT: 11 U/L (ref 0–44)
AST: 14 U/L — ABNORMAL LOW (ref 15–41)
Albumin: 2 g/dL — ABNORMAL LOW (ref 3.5–5.0)
Alkaline Phosphatase: 107 U/L (ref 38–126)
Anion gap: 11 (ref 5–15)
BUN: 37 mg/dL — ABNORMAL HIGH (ref 6–20)
CO2: 20 mmol/L — ABNORMAL LOW (ref 22–32)
Calcium: 7.9 mg/dL — ABNORMAL LOW (ref 8.9–10.3)
Chloride: 106 mmol/L (ref 98–111)
Creatinine, Ser: 1.39 mg/dL — ABNORMAL HIGH (ref 0.44–1.00)
GFR, Estimated: 44 mL/min — ABNORMAL LOW (ref >60)
Glucose, Bld: 151 mg/dL — ABNORMAL HIGH (ref 70–99)
Potassium: 3.7 mmol/L (ref 3.5–5.1)
Sodium: 137 mmol/L (ref 135–145)
Total Bilirubin: 0.6 mg/dL (ref 0.3–1.2)
Total Protein: 5.7 g/dL — ABNORMAL LOW (ref 6.5–8.1)

## 2022-01-17 LAB — CBC WITH DIFFERENTIAL/PLATELET
Abs Immature Granulocytes: 0 10*3/uL (ref 0.00–0.07)
Basophils Absolute: 0.1 10*3/uL (ref 0.0–0.1)
Basophils Relative: 1 %
Eosinophils Absolute: 0.1 10*3/uL (ref 0.0–0.5)
Eosinophils Relative: 1 %
HCT: 29.5 % — ABNORMAL LOW (ref 36.0–46.0)
Hemoglobin: 9.5 g/dL — ABNORMAL LOW (ref 12.0–15.0)
Lymphocytes Relative: 15 %
Lymphs Abs: 1.2 10*3/uL (ref 0.7–4.0)
MCH: 30 pg (ref 26.0–34.0)
MCHC: 32.2 g/dL (ref 30.0–36.0)
MCV: 93.1 fL (ref 80.0–100.0)
Monocytes Absolute: 0.3 10*3/uL (ref 0.1–1.0)
Monocytes Relative: 4 %
Neutro Abs: 6.6 10*3/uL (ref 1.7–7.7)
Neutrophils Relative %: 79 %
Platelets: 243 10*3/uL (ref 150–400)
RBC: 3.17 MIL/uL — ABNORMAL LOW (ref 3.87–5.11)
RDW: 13.3 % (ref 11.5–15.5)
WBC: 8.3 10*3/uL (ref 4.0–10.5)
nRBC: 0 % (ref 0.0–0.2)
nRBC: 0 /100 WBC

## 2022-01-17 LAB — GLUCOSE, CAPILLARY
Glucose-Capillary: 119 mg/dL — ABNORMAL HIGH (ref 70–99)
Glucose-Capillary: 145 mg/dL — ABNORMAL HIGH (ref 70–99)
Glucose-Capillary: 145 mg/dL — ABNORMAL HIGH (ref 70–99)
Glucose-Capillary: 154 mg/dL — ABNORMAL HIGH (ref 70–99)
Glucose-Capillary: 160 mg/dL — ABNORMAL HIGH (ref 70–99)
Glucose-Capillary: 163 mg/dL — ABNORMAL HIGH (ref 70–99)

## 2022-01-17 LAB — MAGNESIUM: Magnesium: 2.4 mg/dL (ref 1.7–2.4)

## 2022-01-17 LAB — SURGICAL PATHOLOGY

## 2022-01-17 MED ORDER — DULOXETINE HCL 60 MG PO CPEP
60.0000 mg | ORAL_CAPSULE | Freq: Two times a day (BID) | ORAL | Status: DC
  Administered 2022-01-17 – 2022-01-19 (×4): 60 mg via ORAL
  Filled 2022-01-17 (×4): qty 1

## 2022-01-17 NOTE — Progress Notes (Addendum)
Physical Therapy Treatment Patient Details Name: [name] MRN: [id number] DOB: [birth year] Today's Date: 01/18/2022   History of Present Illness pt is a 61 y/o female presenting 6/23 with pain in her R thigh from boild PTA which had been treated with antibiotics.  In the ED, she had a CT scan which showed suspected necrotizing fascitis, s/p debridement of the right medial thigh and buttock, 6/24, respiratory complication post op with intubation.  Extubated 6/25.   PMHx:  DM, HTN, neuropathy, L heart cath and coronary angiography    PT Comments    Pt instructed in and performed gait training and therapeutic exercises. Pt reported dizziness during session (reduced at end of session) and displayed some confusion requiring reorientation to time and place. Progress to stair training.   Recommendations for follow up therapy are one component of a multi-disciplinary discharge planning process, led by the attending physician.  Recommendations may be updated based on patient status, additional functional criteria and insurance authorization.  Follow Up Recommendations  Home health PT     Assistance Recommended at Discharge Intermittent Supervision/Assistance  Patient can return home with the following A little help with walking and/or transfers;A little help with bathing/dressing/bathroom;Assistance with cooking/housework;Assist for transportation;Help with stairs or ramp for entrance   Equipment Recommendations  None recommended by PT    Recommendations for Other Services       Precautions / Restrictions Precautions Precautions: Fall Precaution Comments: skin integrity; wound around outside of perineal area Restrictions Weight Bearing Restrictions: No     Mobility  Bed Mobility Overal bed mobility: Needs Assistance Bed Mobility: Supine to Sit     Supine to sit: Supervision (standby A)          Transfers Overall transfer level: Needs assistance Equipment used: Rolling  walker (2 wheels) Transfers: Sit to/from Stand Sit to Stand: Min guard                Ambulation/Gait Ambulation/Gait assistance: Min guard   Assistive device: Rolling walker (2 wheels) Gait Pattern/deviations: Wide base of support, Decreased step length - left, Decreased step length - right       General Gait Details: Pt demonstrated mild unsteadiness but with no LOB. Pt also with decreased endurance capacity and slow cadence.   Stairs             Wheelchair Mobility    Modified Rankin (Stroke Patients Only)       Balance Overall balance assessment: Needs assistance   Sitting balance-Leahy Scale: Good     Standing balance support: Single extremity supported Standing balance-Leahy Scale: Fair                              Cognition Arousal/Alertness: Awake/alert Behavior During Therapy: WFL for tasks assessed/performed Overall Cognitive Status: Impaired/Different from baseline Area of Impairment: Awareness                             Problem Solving: Slow processing General Comments: Pt A and O x 4        Exercises      General Comments General comments (skin integrity, edema, etc.): VSS on RA      Pertinent Vitals/Pain Pain Assessment Pain Assessment: 0-10 Pain Score: 3  Pain Location: R posterior/medial thigh/buttock (when sitting in chair after mobility pt c/o pain) Pain Descriptors / Indicators:  (stinging) Pain Intervention(s): Limited activity within patient's  tolerance, Monitored during session    Home Living                          Prior Function            PT Goals (current goals can now be found in the care plan section) Acute Rehab PT Goals PT Goal Formulation: With patient Time For Goal Achievement: 01/30/22 Potential to Achieve Goals: Good    Frequency    Min 3X/week      PT Plan Current plan remains appropriate    Co-evaluation              AM-PAC PT "6 Clicks"  Mobility   Outcome Measure  Help needed turning from your back to your side while in a flat bed without using bedrails?: A Little Help needed moving from lying on your back to sitting on the side of a flat bed without using bedrails?: A Little Help needed moving to and from a bed to a chair (including a wheelchair)?: A Little Help needed standing up from a chair using your arms (e.g., wheelchair or bedside chair)?: A Little Help needed to walk in hospital room?: A Little Help needed climbing 3-5 steps with a railing? : A Lot 6 Click Score: 17    End of Session Equipment Utilized During Treatment: Gait belt Activity Tolerance: Patient tolerated treatment well;Patient limited by fatigue Patient left: in chair;with chair alarm set;with call bell/phone within reach Nurse Communication: Mobility status (pt confusion) PT Visit Diagnosis: Other abnormalities of gait and mobility (R26.89);Pain Pain - part of body:  (buttock/ R thigh)     Time:  -     Charges:                        Donna Bernard, PT    Donna Bernard 01/18/2022, 12:42 PM

## 2022-01-17 NOTE — Evaluation (Signed)
Occupational Therapy Evaluation Patient Details Name: [name] MRN: [id number] DOB: 05/05/1961 Today's Date: 01/17/2022   History of Present Illness pt is a 61 y/o female presenting 6/23 with pain in her R thigh from boil PTA which had been treated with antibiotics.  In the ED, she had a CT scan which showed suspected necrotizing fascitis, s/p debridement of the right medial thigh and buttock, 6/24, respiratory complication post op with intubation.  Extubated 6/25.   PMHx:  DM, HTN, neuropathy, L heart cath and coronary angiography   Clinical Impression   PTA, pt lives with daughter, typically Modified Independent with ADLs and uses wheelchair primarily for mobility though able to walk short distances to bathroom at home. Pt presents now with primary limiting factor being pain surrounding wound site. Pt able to demo transfers/short mobility with RW at min guard to Min A level, Setup for UB Adls and Mod-Total A for LB ADLs. Total A provided for toileting hygiene only to prevent contaminating wound/dressing. Anticipate with pain control and continued mobility that pt will progress to return home with HHOT. Pt reports her daughter is a Engineer, civil (consulting), can work from home and provide assist as needed. Educated re: need for w/c cushion for optimal pressure relief at home as pt does not currently have a cushion.      Recommendations for follow up therapy are one component of a multi-disciplinary discharge planning process, led by the attending physician.  Recommendations may be updated based on patient status, additional functional criteria and insurance authorization.   Follow Up Recommendations  Home health OT    Assistance Recommended at Discharge Frequent or constant Supervision/Assistance  Patient can return home with the following A little help with walking and/or transfers;A little help with bathing/dressing/bathroom;Assistance with cooking/housework;Help with stairs or ramp for entrance     Functional Status Assessment  Patient has had a recent decline in their functional status and demonstrates the ability to make significant improvements in function in a reasonable and predictable amount of time.  Equipment Recommendations  Wheelchair cushion (measurements OT) (gel vs roho cushion for wheelchair)    Recommendations for Other Services       Precautions / Restrictions Precautions Precautions: Fall;Other (comment) Precaution Comments: skin integrity; wound around outside of perineal area Restrictions Weight Bearing Restrictions: No      Mobility Bed Mobility Overal bed mobility: Needs Assistance Bed Mobility: Sit to Supine       Sit to supine: Min assist   General bed mobility comments: light Min A to get BLE fully back into bed    Transfers Overall transfer level: Needs assistance Equipment used: Rolling walker (2 wheels) Transfers: Sit to/from Stand, Bed to chair/wheelchair/BSC Sit to Stand: Min guard     Step pivot transfers: Min assist     General transfer comment: able to stand with increased time from recliner and BSC using RW without assist. Min A for RW manuvering to [facility] and steps back to bed with cues for line awareness      Balance Overall balance assessment: Needs assistance   Sitting balance-Leahy Scale: Good     Standing balance support: Single extremity supported Standing balance-Leahy Scale: Fair                             ADL either performed or assessed with clinical judgement   ADL Overall ADL's : Needs assistance/impaired Eating/Feeding: Independent   Grooming: Set up;Sitting   Upper Body Bathing:  Set up;Sitting   Lower Body Bathing: Moderate assistance;Sit to/from stand   Upper Body Dressing : Set up;Sitting   Lower Body Dressing: Moderate assistance;Sit to/from stand   Toilet Transfer: Minimal assistance;Stand-pivot;BSC/3in1;Rolling walker (2 wheels) Toilet Transfer Details (indicate cue type and  reason): minor assist for RW mgmt, slow pace Toileting- Clothing Manipulation and Hygiene: Total assistance;Sit to/from stand Toileting - Clothing Manipulation Details (indicate cue type and reason): assist with posterior hygiene in standing to prevent contaimination of wound area     Functional mobility during ADLs: Min guard;Rolling walker (2 wheels) General ADL Comments: Limited by significant pain in wound region (around inner thigh/buttocks) and fatigue with daily tasks that pt typically able to complete without much issue at home. focus on comfort/pressure relief with education on need for good w/c cushion as pt spending a lot of time in wheelchair. educated pt that daughter may have to assist with posterior hygiene at home depending on wound care/bandage set up to avoid contaminating wound area     Vision Ability to See in Adequate Light: 0 Adequate Patient Visual Report: No change from baseline Vision Assessment?: No apparent visual deficits     Perception     Praxis      Pertinent Vitals/Pain Pain Assessment Pain Assessment: Faces Faces Pain Scale: Hurts whole lot Pain Location: R posterior/mediat thigh/buttock Pain Descriptors / Indicators: Burning, Sore, Guarding, Grimacing Pain Intervention(s): Monitored during session, Limited activity within patient's tolerance, Premedicated before session, Repositioned, Other (comment) (plan to trial geomat)     Hand Dominance Right   Extremity/Trunk Assessment Upper Extremity Assessment Upper Extremity Assessment: Generalized weakness   Lower Extremity Assessment Lower Extremity Assessment: Defer to PT evaluation   Cervical / Trunk Assessment Cervical / Trunk Assessment: Kyphotic   Communication Communication Communication: No difficulties   Cognition Arousal/Alertness: Awake/alert Behavior During Therapy: WFL for tasks assessed/performed Overall Cognitive Status: No family/caregiver present to determine baseline cognitive  functioning Area of Impairment: Memory, Awareness, Problem solving                     Memory: Decreased short-term memory     Awareness: Emergent Problem Solving: Slow processing, Decreased initiation, Difficulty sequencing, Requires verbal cues General Comments: cues for sequencing, slower processing though feel d/t pt instrically distracted by pain     General Comments  VSS on RA    Exercises     Shoulder Instructions      Home Living Family/patient expects to be discharged to:: Private residence Living Arrangements: Children Available Help at Discharge: Family;Available 24 hours/day Type of Home: House Home Access: Stairs to enter [employer] of Steps: several Entrance Stairs-Rails: Right;Left Home Layout: One level     Bathroom Shower/Tub: Producer, television/film/video: Standard Bathroom Accessibility: No   Home Equipment: Agricultural consultant (2 wheels);Wheelchair - manual   Additional Comments: reports her daughter is a Engineer, civil (consulting), works from home and can assist. family assisting in remodeling an accessible mobile home for pt to move to in the future. pt uses dish drying mat in w/c as cushion      Prior Functioning/Environment Prior Level of Function : Needs assist       Physical Assist : ADLs (physical);Mobility (physical) Mobility (physical): Transfers;Stairs ADLs (physical): IADLs Mobility Comments: needs assist up/down steps and in the shower.  Use W/C primarily d/t hx of broken and ill-healing L foot, transfers mod I and walkes short distances from W/C to shower or toilet. ADLs Comments: typically Modified Independent with  ADLs. Family assist with IADLs, driving.        OT Problem List: Decreased strength;Decreased activity tolerance;Impaired balance (sitting and/or standing);Pain;Decreased knowledge of use of DME or AE      OT Treatment/Interventions: Self-care/ADL training;Therapeutic exercise;Energy conservation;DME and/or AE  instruction;Therapeutic activities;Patient/family education;Balance training    OT Goals(Current goals can be found in the care plan section) Acute Rehab OT Goals Patient Stated Goal: pain control, get better soon OT Goal Formulation: With patient Time For Goal Achievement: 01/31/22 Potential to Achieve Goals: Good ADL Goals Pt Will Perform Lower Body Bathing: with supervision;sitting/lateral leans;sit to/from stand Pt Will Perform Lower Body Dressing: with supervision;sit to/from stand;sitting/lateral leans Pt Will Transfer to Toilet: with supervision;ambulating Pt/caregiver will Perform Home Exercise Program: Increased strength;Both right and left upper extremity;With theraband;Independently;With written HEP provided  OT Frequency: Min 2X/week    Co-evaluation              AM-PAC OT "6 Clicks" Daily Activity     Outcome Measure Help from another person eating meals?: None Help from another person taking care of personal grooming?: A Little Help from another person toileting, which includes using toliet, bedpan, or urinal?: Total Help from another person bathing (including washing, rinsing, drying)?: A Lot Help from another person to put on and taking off regular upper body clothing?: A Little Help from another person to put on and taking off regular lower body clothing?: A Lot 6 Click Score: 15   End of Session Equipment Utilized During Treatment: Rolling walker (2 wheels) Nurse Communication: Mobility status;Other (comment) (trial geomat)  Activity Tolerance: Patient tolerated treatment well;Patient limited by pain Patient left: in bed;with call bell/phone within reach;with bed alarm set;with nursing/sitter in room  OT Visit Diagnosis: Unsteadiness on feet (R26.81);Other abnormalities of gait and mobility (R26.89);Muscle weakness (generalized) (M62.81);Pain Pain - part of body:  (bottom)                Time: 2956-2130 OT Time Calculation (min): 25 min Charges:  OT General  Charges $OT Visit: 1 Visit OT Evaluation $OT Eval Moderate Complexity: 1 Mod OT Treatments $Self Care/Home Management : 8-22 mins  Bradd Canary, OTR/L Acute Rehab Services Office: [phone]   Lorre Munroe 01/17/2022, 12:31 PM

## 2022-01-18 DIAGNOSIS — A419 Sepsis, unspecified organism: Principal | ICD-10-CM

## 2022-01-18 DIAGNOSIS — N1831 Chronic kidney disease, stage 3a: Secondary | ICD-10-CM

## 2022-01-18 DIAGNOSIS — E1122 Type 2 diabetes mellitus with diabetic chronic kidney disease: Secondary | ICD-10-CM

## 2022-01-18 DIAGNOSIS — A429 Actinomycosis, unspecified: Secondary | ICD-10-CM

## 2022-01-18 DIAGNOSIS — M7989 Other specified soft tissue disorders: Secondary | ICD-10-CM | POA: Diagnosis not present

## 2022-01-18 DIAGNOSIS — J95821 Acute postprocedural respiratory failure: Secondary | ICD-10-CM

## 2022-01-18 DIAGNOSIS — N7682 Fournier disease of vagina and vulva: Secondary | ICD-10-CM

## 2022-01-18 LAB — CBC WITH DIFFERENTIAL/PLATELET
Abs Immature Granulocytes: 0.99 10*3/uL — ABNORMAL HIGH (ref 0.00–0.07)
Basophils Absolute: 0.1 10*3/uL (ref 0.0–0.1)
Basophils Relative: 1 %
Eosinophils Absolute: 0.3 10*3/uL (ref 0.0–0.5)
Eosinophils Relative: 3 %
HCT: 30.3 % — ABNORMAL LOW (ref 36.0–46.0)
Hemoglobin: 9.6 g/dL — ABNORMAL LOW (ref 12.0–15.0)
Immature Granulocytes: 10 %
Lymphocytes Relative: 16 %
Lymphs Abs: 1.6 10*3/uL (ref 0.7–4.0)
MCH: 29.6 pg (ref 26.0–34.0)
MCHC: 31.7 g/dL (ref 30.0–36.0)
MCV: 93.5 fL (ref 80.0–100.0)
Monocytes Absolute: 0.8 10*3/uL (ref 0.1–1.0)
Monocytes Relative: 8 %
Neutro Abs: 6.1 10*3/uL (ref 1.7–7.7)
Neutrophils Relative %: 62 %
Platelets: 259 10*3/uL (ref 150–400)
RBC: 3.24 MIL/uL — ABNORMAL LOW (ref 3.87–5.11)
RDW: 13.2 % (ref 11.5–15.5)
WBC: 9.8 10*3/uL (ref 4.0–10.5)
nRBC: 0 % (ref 0.0–0.2)

## 2022-01-18 LAB — BASIC METABOLIC PANEL
Anion gap: 9 (ref 5–15)
BUN: 23 mg/dL — ABNORMAL HIGH (ref 6–20)
CO2: 21 mmol/L — ABNORMAL LOW (ref 22–32)
Calcium: 8 mg/dL — ABNORMAL LOW (ref 8.9–10.3)
Chloride: 111 mmol/L (ref 98–111)
Creatinine, Ser: 1.08 mg/dL — ABNORMAL HIGH (ref 0.44–1.00)
GFR, Estimated: 60 mL/min — ABNORMAL LOW (ref >60)
Glucose, Bld: 96 mg/dL (ref 70–99)
Potassium: 3.7 mmol/L (ref 3.5–5.1)
Sodium: 141 mmol/L (ref 135–145)

## 2022-01-18 LAB — GLUCOSE, CAPILLARY
Glucose-Capillary: 104 mg/dL — ABNORMAL HIGH (ref 70–99)
Glucose-Capillary: 110 mg/dL — ABNORMAL HIGH (ref 70–99)
Glucose-Capillary: 128 mg/dL — ABNORMAL HIGH (ref 70–99)
Glucose-Capillary: 135 mg/dL — ABNORMAL HIGH (ref 70–99)
Glucose-Capillary: 136 mg/dL — ABNORMAL HIGH (ref 70–99)
Glucose-Capillary: 148 mg/dL — ABNORMAL HIGH (ref 70–99)

## 2022-01-18 LAB — CULTURE, BLOOD (ROUTINE X 2)
Culture: NO GROWTH
Culture: NO GROWTH

## 2022-01-18 MED ORDER — OXYCODONE HCL 5 MG PO TABS: 5.0000 mg | ORAL_TABLET | Freq: Once | ORAL | Status: DC

## 2022-01-18 MED ORDER — OXYCODONE HCL 5 MG PO TABS
5.0000 mg | ORAL_TABLET | Freq: Once | ORAL | Status: AC
  Administered 2022-01-18: 5 mg via ORAL
  Filled 2022-01-18: qty 1

## 2022-01-18 MED ORDER — AMOXICILLIN 500 MG PO CAPS: 1000.0000 mg | ORAL_CAPSULE | Freq: Three times a day (TID) | ORAL | Status: DC

## 2022-01-18 MED ORDER — AMOXICILLIN-POT CLAVULANATE 875-125 MG PO TABS
1.0000 | ORAL_TABLET | Freq: Two times a day (BID) | ORAL | Status: DC
  Administered 2022-01-18 – 2022-01-19 (×2): 1 via ORAL
  Filled 2022-01-18 (×2): qty 1

## 2022-01-18 NOTE — Progress Notes (Addendum)
PROGRESS NOTE   [name]  JKK:[id number] DOB: [birth year] DOA: 01/13/2022 PCP: Teressa Senter, FNP  Brief Narrative:   61 year old white female  01/13/2022:  Presented to [facility] ED with right thigh abscess without fever secondary to a boil that started on 6/18 CTA of the right hip showed subcutaneous edema with emphysema Leukocytosis 13 lactic acid normal Found to be tachycardic Creatinine 2.2 sodium 128 CBG 310 Started on linezolid as clindamycin could not be ordered 6/24: Debridement 20 X15 X 4 cm--decision made for patient to be left intubated as may have required further surgery 6/25: Extubated as no further need for surgery   Hospital-Problem based course  Sepsis on admission 2/2 abscess status postdebridement 6/24 General surgery Continue on Flagyl and cefepime discontinue linezolide--growing staph epi-- follow culture from 6/23 to completion. Continue wet-to-dry dressings as per surgery who saw the patient-they reevaluated the wound on 6/26 and felt no surgery was needed -ID consult  Recent hospitalization low back pain-is wheelchair-bound Evaluated at the time by neurosurgery and needs outpatient follow-up for consideration of steroids once sepsis physiology is resolved  Hyponatremia on admission -resolved  AKI on admission superimposed on CKD 3B-peak creatinine 2.2 Likely combination of sepsis Bactrim diuretics and ACE inhibitor PTA Creatinine is better no fluids at this time--monitor trends periodically  2.7 low-density lesion left lower kidney Will need referral from PCP to outpatient nephrology  DM TY 2 with neuropathy nephropathy Currently on 6 sliding scale coverage-CBGs 140-160   Clean cardiac cath 2018 Hold GDMT prazosin enalapril at this time-reassess as an outpatient for resumption  Depression Resume Cymbalta 60 twice daily discontinue Lexapro 10 daily Unclear indication for Tegretol 200 twice daily would hold at this time would also  hold hydroxyzine 25 3 times daily  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 Affects all aspects of care likely poor healing if not careful-outpatient counseling  DVT prophylaxis: Heparin Code Status: Full Family Communication: None present Disposition:  Status is: Inpatient Remains inpatient appropriate because:   Wound care and pain control   Consultants:  General surgery   Antimicrobials:   Continues on cefepime and Flagyl likely de-escalate to Augmentin in 3 to 4 days based on wound reevaluation by general surgery   Subjective:  Up working with PT  Objective: Vitals:   01/17/22 1418 01/17/22 2100 01/18/22 0448 01/18/22 0751  BP: 130/70 (!) 142/54 (!) 127/54 (!) 121/43  Pulse: 79 80 75 77  Resp: 17  14   Temp: 98 F (36.7 C) 98 F (36.7 C)  97.9 F (36.6 C)  TempSrc: Oral Oral  Oral  SpO2: 96% 100% 100% 97%  Weight:      Height:        Intake/Output Summary (Last 24 hours) at 01/18/2022 1231 Last data filed at 01/18/2022 0900 Gross per 24 hour  Intake 468.02 ml  Output --  Net 468.02 ml   Filed Weights   01/15/22 0355 01/16/22 0500 01/17/22 0449  Weight: 124.8 kg 125.1 kg 122.1 kg    Examination:  General: Appearance:    Severely obese female in no acute distress     Lungs:     respirations unlabored  Heart:    Normal heart rate.   MS:   All extremities are intact.   Neurologic:   Flat affect     Data Reviewed: personally reviewed   CBC    Component Value Date/Time   WBC 9.8 01/18/2022 0305   RBC 3.24 (L) 01/18/2022 0305   HGB 9.6 (L) 01/18/2022 0305  HCT 30.3 (L) 01/18/2022 0305   PLT 259 01/18/2022 0305   MCV 93.5 01/18/2022 0305   MCH 29.6 01/18/2022 0305   MCHC 31.7 01/18/2022 0305   RDW 13.2 01/18/2022 0305   LYMPHSABS 1.6 01/18/2022 0305   MONOABS 0.8 01/18/2022 0305   EOSABS 0.3 01/18/2022 0305   BASOSABS 0.1 01/18/2022 0305      Latest Ref Rng & Units 01/18/2022    3:05 AM 01/17/2022    3:05 AM 01/16/2022    3:58 AM  CMP  Glucose 70 - 99  mg/dL 96  151  127   BUN 6 - 20 mg/dL 23  37  48   Creatinine 0.44 - 1.00 mg/dL 1.08  1.39  1.99   Sodium 135 - 145 mmol/L 141  137  134   Potassium 3.5 - 5.1 mmol/L 3.7  3.7  3.8   Chloride 98 - 111 mmol/L 111  106  103   CO2 22 - 32 mmol/L '21  20  22   '$ Calcium 8.9 - 10.3 mg/dL 8.0  7.9  7.8   Total Protein 6.5 - 8.1 g/dL  5.7    Total Bilirubin 0.3 - 1.2 mg/dL  0.6    Alkaline Phos 38 - 126 U/L  107    AST 15 - 41 U/L  14    ALT 0 - 44 U/L  11       Radiology Studies: DG CHEST PORT 1 VIEW  Result Date: 01/17/2022 CLINICAL DATA:  Atelectasis, wound debridement EXAM: PORTABLE CHEST 1 VIEW COMPARISON:  01/14/2022 chest radiograph FINDINGS: Endotracheal and nasogastric tubes have been removed. Right internal jugular central venous catheter tip: SVC. Low lung volumes are present, causing crowding of the pulmonary vasculature. Improved aeration of both lung bases with only mild persistent subsegmental atelectasis along the right hemidiaphragm. Substantially reduced retrocardiac airspace opacity. No blunting of the lateral costophrenic angles. IMPRESSION: 1. Substantial improvement in the left greater than right basilar airspace opacities, likely reflecting improved atelectasis. Lung volumes are still somewhat low although improved from 01/14/2022. Electronically Signed   By: Van Clines M.D.   On: 01/17/2022 08:17     Scheduled Meds:  Chlorhexidine Gluconate Cloth  6 each Topical Q0600   DULoxetine  60 mg Oral BID   escitalopram  10 mg Oral Daily   gabapentin  300 mg Oral BID   heparin injection (subcutaneous)  5,000 Units Subcutaneous Q8H   insulin aspart  0-20 Units Subcutaneous Q4H   leptospermum manuka honey  1 Application Topical Daily   metroNIDAZOLE  500 mg Oral Q12H   polyethylene glycol  17 g Oral Daily   sodium chloride flush  10-40 mL Intracatheter Q12H   Continuous Infusions:  sodium chloride 10 mL/hr at 01/14/22 0317   ceFEPime (MAXIPIME) IV 2 g (01/18/22 0451)      LOS: 5 days   Time spent: [place], DO [facility]   01/18/2022, 12:31 PM

## 2022-01-18 NOTE — TOC Initial Note (Signed)
Transition of Care [facility]) - Initial/Assessment Note    Patient Details  Name: [name] MRN: [id number] Date of Birth: 04/29/1961  [facility]) CM/SW Contact:    Sharin Mons, RN Phone Number: 01/18/2022, 7:32 PM  Clinical Narrative:                 Readmitted with abscess on R thigh/ severe sepsis/AKI, hx type 2 diabetes, hypertension.From home. Active with [facility] (PT/OT), PTA. Will need resumption orders from MD for continued services @ d/c. Already has RW.  - S/p I&D 6/23, necrotizing soft tissue infection/right medial thigh and buttock , ID following....  TOC team monitoring and will assist with TOC needs...   Expected Discharge Plan: [place] Barriers to Discharge: Continued Medical Work up   Patient Goals and CMS Choice        Expected Discharge Plan and Services Expected Discharge Plan: [place]                                              Prior Living Arrangements/Services                       Activities of Daily Living   ADL Screening (condition at time of admission) Is the patient deaf or have difficulty hearing?: No  Permission Sought/Granted                  Emotional Assessment              Admission diagnosis:  Necrotizing soft tissue infection [M79.89] Patient Active Problem List   Diagnosis Date Noted   Actinomyces infection    Pressure injury of skin 01/14/2022   Sepsis ([place])    Postoperative respiratory failure ([place])    Necrotizing fasciitis ([place])    Necrotizing soft tissue infection 01/13/2022   Right foot ulcer ([place]) 12/23/2021   Ambulatory dysfunction 12/23/2021   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   Stage 3a chronic kidney disease (CKD) ([place]) 12/22/2021   Neuropathy 12/22/2021   Lesion of left native kidney 12/22/2021   Dehydration 11/23/2021   Obesity, Class III, BMI 40-49.9 (morbid obesity) ([place]) 11/23/2021   AKI (acute  kidney injury) ([place]) 05/16/2021   Serum creatinine raised 08/24/2017   Atypical chest pain 07/12/2017   Chest pain 07/11/2017   Unstable angina ([place]) 07/11/2017   DIZZINESS 04/25/2010   CHEST PAIN, ATYPICAL 04/25/2010   ABDOMINAL PAIN 12/03/2009   PALPITATIONS 11/15/2009   Obesity (BMI 30-39.9) 10/07/2009   TROCHANTERIC BURSITIS, LEFT 10/07/2009   Type 2 diabetes mellitus ([place]) 09/30/2009   MDD (major depressive disorder) 09/30/2009   Migraine headache 09/30/2009   Essential hypertension 09/30/2009   GERD 04/15/2009   DIARRHEA, CHRONIC 04/15/2009   PCP:  Teressa Senter, FNP Pharmacy:   [facility] [place], [place] - [place] [address] [place] [address] Phone: [phone] Fax: [place] [address], [place] [address] Phone: [phone] Fax: [phone]     Social Determinants of Health (SDOH) Interventions    Readmission Risk Interventions     No data to display

## 2022-01-18 NOTE — Consult Note (Signed)
[name] for Infectious Disease    Date of Admission:  01/13/2022     Reason for Consult: fournier's    Referring Provider: Eliseo Squires  Lines:  6/24-c right ij  Abx: 6/23-c cefepime 6/23-c linezolid  6/23 flagyl 6/23 vanc        Assessment: 61 yo female recurrent boils in thighs admitted 6/23 for thigh abscess and severe sepsis with aki  Ct pelvis/femur showed no intrapelvic process but changes c/w deep space infection and in the setting obesity/dm2 probably fournier  She had received 10 day bactrim since 7 days prior to admission  S/p I&D 6/23 which showed rare staph epi and actinomyces species Admission bcx negative  She has no iud   Aki resolved and clinically doing very well post-surgery      I agree will need to treat for nec fasc broadly for another few days. Oral abx at this time is fine  Staph epi I am not too concerned about being skin colonizer Actino typically doesn't do soft tissue infection outside of spillage of cervical (empyema necessitans) or progressive abdominal or uterine process. Making sense of actino in and management in such case is rather lacking in evidence  Will give the actino benefit of the doubt with long term mop-up therapy and reevaluate   She can continue wound care for right foot dm ulcer    Plan: Stop iv abx Can remove central line Start amox-clav 875 mg po bid for 3 more days until 7/01 on which date can transition to amoxicillin 750 mg po tid for planned 3 months (shorter or longer course depends on further clinic evaluation) Id clinic follow up arranged for 7/13 @ [facility] [place], [place], [place] [address] Phone: [phone]  Discussed with primary team Ok to discharge from ID standpoint   I spent 75 minute reviewing data/chart, and coordinating care and >50% direct face to face time providing counseling/discussing diagnostics/treatment plan with  patient      ------------------------------------------------ Principal Problem:   Necrotizing soft tissue infection Active Problems:   Type 2 diabetes mellitus (HCC)   Obesity, Class III, BMI 40-49.9 (morbid obesity) ([place])   Stage 3a chronic kidney disease (CKD) ([place])   Sepsis ([place])   Postoperative respiratory failure ([place])   Pressure injury of skin   Necrotizing fasciitis ([place])    HPI: [name] is a 61 y.o. female dm2, neuorpathy, right foot ulcer, wheel chair bound, recurrent boils in groin, admitted 6/23 with severe sepsis and nec fasc of groin  She reports 2-4 weeks feeling unwell and increasing pain in groin area  She has recurrent boils in those area. No hx hidradenitis. No hx of upper suprubic/axillary boils  It appears she was given bactrim 10 day course for this starting 6/16  However, due to progression of sx came to [facility] on 6/24 and transferred to [facility] for further management of severe sepsis and nec fasc of groin  Initial w/u at [facility] showed imaging finding air and edema in soft tissue at groin/pelvic floor but no intrapelvic pathology Afebrile Moderate leukocytosis aki Bcx negative Started on bsAbx Kept intubated and transferred to [facility] for concern further surgery might be needed  Appears doing well Aki resolved Extubated No further surgical plan  No n/v/diarrhea    Family History  Problem Relation Age of Onset   Diabetes Mother    Heart failure Mother    Hypertension Mother  Cancer Mother    CAD Mother 62       Died of MI   Diabetes Father    Alcohol abuse Father     Social History   Tobacco Use   Smoking status: Never   Smokeless tobacco: Never  Substance Use Topics   Alcohol use: No   Drug use: No    Allergies  Allergen Reactions   Ibuprofen Swelling   Red Dye     Other reaction(s): Nervous syncope   Codeine Palpitations   Nitrofurantoin Diarrhea and Nausea And Vomiting    Review of  Systems: ROS All Other ROS was negative, except mentioned above   Past Medical History:  Diagnosis Date   Chest pain    a. 06/2017: cath showing normal cors with a preserved EF of 55-65%.    Depression    Diabetes mellitus    Hypertension    Neuropathy    Pneumonia    Sepsis ([place])        Scheduled Meds:  Chlorhexidine Gluconate Cloth  6 each Topical Q0600   DULoxetine  60 mg Oral BID   escitalopram  10 mg Oral Daily   gabapentin  300 mg Oral BID   heparin injection (subcutaneous)  5,000 Units Subcutaneous Q8H   insulin aspart  0-20 Units Subcutaneous Q4H   leptospermum manuka honey  1 Application Topical Daily   metroNIDAZOLE  500 mg Oral Q12H   polyethylene glycol  17 g Oral Daily   sodium chloride flush  10-40 mL Intracatheter Q12H   Continuous Infusions:  sodium chloride 10 mL/hr at 01/14/22 0317   ceFEPime (MAXIPIME) IV 2 g (01/18/22 0451)   PRN Meds:.sodium chloride, docusate sodium, fentaNYL (SUBLIMAZE) injection, mouth rinse, oxyCODONE-acetaminophen, phenol, polyethylene glycol, sodium chloride flush   OBJECTIVE: Blood pressure (!) 121/43, pulse 77, temperature 97.9 F (36.6 C), temperature source Oral, resp. rate 14, height '5\' 4"'$  (1.626 m), weight 122.1 kg, SpO2 97 %.  Physical Exam  General/constitutional: no distress, pleasant HEENT: Normocephalic, PER, Conj Clear, EOMI, Oropharynx clear Neck supple CV: rrr no mrg Lungs: clear to auscultation, normal respiratory effort Abd: Soft, Nontender Gu: reviewed picture in chart; dressing c/d Ext: no edema Msk/Skin: right foot dressing c/d; no cellulitis or purulence or foul odor discharge Neuro: nonfocal  Central line presence: yes right ij central line site no purulence/swelling   Lab Results Lab Results  Component Value Date   WBC 9.8 01/18/2022   HGB 9.6 (L) 01/18/2022   HCT 30.3 (L) 01/18/2022   MCV 93.5 01/18/2022   PLT 259 01/18/2022    Lab Results  Component Value Date   CREATININE 1.08  (H) 01/18/2022   BUN 23 (H) 01/18/2022   NA 141 01/18/2022   K 3.7 01/18/2022   CL 111 01/18/2022   CO2 21 (L) 01/18/2022    Lab Results  Component Value Date   ALT 11 01/17/2022   AST 14 (L) 01/17/2022   ALKPHOS 107 01/17/2022   BILITOT 0.6 01/17/2022      Microbiology: Recent Results (from the past 240 hour(s))  Culture, blood (Routine x 2)     Status: None   Collection Time: 01/13/22  2:53 PM   Specimen: BLOOD RIGHT HAND  Result Value Ref Range Status   Specimen Description   Final    BLOOD RIGHT HAND BOTTLES DRAWN AEROBIC AND ANAEROBIC   Special Requests   Final    Blood Culture results may not be optimal due to an excessive volume of blood  received in culture bottles   Culture   Final    NO GROWTH 5 DAYS Performed at [facility], [address] [place], [place] [address]    Report Status 01/18/2022 FINAL  Final  Culture, blood (Routine x 2)     Status: None   Collection Time: 01/13/22  2:55 PM   Specimen: Right Antecubital; Blood  Result Value Ref Range Status   Specimen Description   Final    RIGHT ANTECUBITAL BOTTLES DRAWN AEROBIC AND ANAEROBIC   Special Requests   Final    Blood Culture results may not be optimal due to an excessive volume of blood received in culture bottles   Culture   Final    NO GROWTH 5 DAYS Performed at [facility], [address], [place], [place] [address]    Report Status 01/18/2022 FINAL  Final  Aerobic/Anaerobic Culture w Gram Stain (surgical/deep wound)     Status: None (Preliminary result)   Collection Time: 01/13/22  8:30 PM   Specimen: PATH Other; Body Fluid  Result Value Ref Range Status   Specimen Description   Final    THIGH Performed at [facility], [address] [place], [place] [address]    Special Requests   Final    NONE Performed at [facility], [address], [place], [place] [address]    Gram Stain   Final    RARE WBC PRESENT,BOTH PMN AND MONONUCLEAR ABUNDANT GRAM POSITIVE COCCI IN PAIRS ABUNDANT GRAM  NEGATIVE RODS FEW GRAM POSITIVE RODS Performed at [facility], [place] [address], [place], [place] [address]    Culture   Final    RARE ACTINOMYCES SPECIES Standardized susceptibility testing for this organism is not available. RARE STAPHYLOCOCCUS EPIDERMIDIS NO ANAEROBES ISOLATED; CULTURE IN PROGRESS FOR 5 DAYS    Report Status PENDING  Incomplete   Organism ID, Bacteria STAPHYLOCOCCUS EPIDERMIDIS  Final      Susceptibility   Staphylococcus epidermidis - MIC*    CIPROFLOXACIN >=8 RESISTANT Resistant     ERYTHROMYCIN >=8 RESISTANT Resistant     GENTAMICIN <=0.5 SENSITIVE Sensitive     OXACILLIN >=4 RESISTANT Resistant     TETRACYCLINE <=1 SENSITIVE Sensitive     VANCOMYCIN 2 SENSITIVE Sensitive     TRIMETH/SULFA 20 SENSITIVE Sensitive     CLINDAMYCIN >=8 RESISTANT Resistant     RIFAMPIN <=0.5 SENSITIVE Sensitive     Inducible Clindamycin NEGATIVE Sensitive     * RARE STAPHYLOCOCCUS EPIDERMIDIS  MRSA Next Gen by PCR, Nasal     Status: Abnormal   Collection Time: 01/14/22  1:30 AM   Specimen: Nasal Mucosa; Nasal Swab  Result Value Ref Range Status   MRSA by PCR Next Gen DETECTED (A) NOT DETECTED Final    Comment: RESULT CALLED TO, READ BACK BY AND VERIFIED WITH: OWNLEY,RN'@0247'$  01/14/22 [place] (NOTE) The GeneXpert MRSA Assay (FDA approved for NASAL specimens only), is one component of a comprehensive MRSA colonization surveillance program. It is not intended to diagnose MRSA infection nor to guide or monitor treatment for MRSA infections. Test performance is not FDA approved in patients less than 51 years old. Performed at [facility], [place] [address], [place], [place] [address]      Serology:    Imaging: If present, new imagings (plain films, ct scans, and mri) have been personally visualized and interpreted; radiology reports have been reviewed. Decision making incorporated into the Impression / Recommendations.  6/23 ct pelvis 1. Superficial and deep  subcutaneous edema  and air in the inferior right gluteal region concerning for infection/cellulitis. No focal abscess identified.  6/23 ct femur right 1. There are inflammatory changes within the right buttock and adjacent posteromedial right thigh subcutaneous fat including edema swelling, and subcutaneous air. This is suspicious for a gas producing infectious organism an infectious cellulitis. No walled-off abscess is seen. 2. No CT evidence of osteomyelitis.     Jabier Mutton, [place] for Infectious [place] [phone] pager    01/18/2022, 2:04 PM

## 2022-01-18 NOTE — Consult Note (Signed)
   [facility] CM Inpatient Consult   01/18/2022  [name] 10/21/1960 [id number]  [place] [facility] [ACO] Patient: [name] [showing on [facility] banner for readmission]  Primary Care Provider:  Teressa Senter, FNP, is not a [facility] provider, patient noted on [facility] banner for high risk review, roster list Darla Lesches, NP at [employer] but patient has not seen this provider    Patient screened for hospitalization with noted high risk score for unplanned readmission risk and  to assess for potential [place] [facility] service needs for post hospital transition.     Came by to see patient to confirm PCP and needs, patient endorses PCP is with [facility], not currently in [facility].  Explained reason for follow up for readmission review and high risk she confirms her Medicaid as well.    Plan:  Patient verbalizes ongoing follow up to be with PCP at [facility], no [facility] at this time.  For questions contact:   Natividad Brood, RN BSN [facility] Liaison  [phone] business mobile phone Toll free office [phone]  Fax number: [phone] [email or web address]

## 2022-01-18 NOTE — Progress Notes (Signed)
5 Days Post-Op   Subjective/Chief Complaint: No new complaints. Pain controlled with PO meds.   Objective: Vital signs in last 24 hours: Temp:  [97.9 F (36.6 C)-98 F (36.7 C)] 97.9 F (36.6 C) (06/28 0751) Pulse Rate:  [75-80] 77 (06/28 0751) Resp:  [14-17] 14 (06/28 0448) BP: (121-142)/(43-70) 121/43 (06/28 0751) SpO2:  [96 %-100 %] 97 % (06/28 0751) Last BM Date : 01/17/22  Intake/Output from previous day: 06/27 0701 - 06/28 0700 In: 448 [P.O.:240; I.V.:10; IV Piggyback:198] Out: -  Intake/Output this shift: Total I/O In: 260 [P.O.:240; I.V.:20] Out: -   General appearance: alert and cooperative Resp: clear to auscultation bilaterally GI: soft, NT Incision/Wound: remains clean with granulation starting, no cellulitis  Lab Results:  Recent Labs    01/17/22 0305 01/18/22 0305  WBC 8.3 9.8  HGB 9.5* 9.6*  HCT 29.5* 30.3*  PLT 243 259   BMET Recent Labs    01/17/22 0305 01/18/22 0305  NA 137 141  K 3.7 3.7  CL 106 111  CO2 20* 21*  GLUCOSE 151* 96  BUN 37* 23*  CREATININE 1.39* 1.08*  CALCIUM 7.9* 8.0*   PT/INR No results for input(s): "LABPROT", "INR" in the last 72 hours.  ABG No results for input(s): "PHART", "HCO3" in the last 72 hours.  Invalid input(s): "PCO2", "PO2"   Studies/Results: DG CHEST PORT 1 VIEW  Result Date: 01/17/2022 CLINICAL DATA:  Atelectasis, wound debridement EXAM: PORTABLE CHEST 1 VIEW COMPARISON:  01/14/2022 chest radiograph FINDINGS: Endotracheal and nasogastric tubes have been removed. Right internal jugular central venous catheter tip: SVC. Low lung volumes are present, causing crowding of the pulmonary vasculature. Improved aeration of both lung bases with only mild persistent subsegmental atelectasis along the right hemidiaphragm. Substantially reduced retrocardiac airspace opacity. No blunting of the lateral costophrenic angles. IMPRESSION: 1. Substantial improvement in the left greater than right basilar airspace  opacities, likely reflecting improved atelectasis. Lung volumes are still somewhat low although improved from 01/14/2022. Electronically Signed   By: Van Clines M.D.   On: 01/17/2022 08:17    Anti-infectives: Anti-infectives (From admission, onward)    Start     Dose/Rate Route Frequency Ordered Stop   01/16/22 2200  linezolid (ZYVOX) tablet 600 mg  Status:  Discontinued        600 mg Oral Every 12 hours 01/16/22 1122 01/17/22 1516   01/16/22 2200  metroNIDAZOLE (FLAGYL) tablet 500 mg        500 mg Oral Every 12 hours 01/16/22 1122     01/14/22 1730  vancomycin (VANCOCIN) IVPB 1000 mg/200 mL premix  Status:  Discontinued       See Hyperspace for full Linked Orders Report.   1,000 mg 200 mL/hr over 60 Minutes Intravenous Every 24 hours 01/13/22 1640 01/14/22 0421   01/14/22 1000  metroNIDAZOLE (FLAGYL) IVPB 500 mg  Status:  Discontinued        500 mg 100 mL/hr over 60 Minutes Intravenous Every 12 hours 01/14/22 0123 01/16/22 1122   01/14/22 0400  ceFEPIme (MAXIPIME) 2 g in sodium chloride 0.9 % 100 mL IVPB        2 g 200 mL/hr over 30 Minutes Intravenous Every 12 hours 01/14/22 0139     01/13/22 2200  linezolid (ZYVOX) IVPB 600 mg  Status:  Discontinued        600 mg 300 mL/hr over 60 Minutes Intravenous Every 12 hours 01/13/22 1822 01/16/22 1122   01/13/22 1730  vancomycin (VANCOREADY) IVPB 2000 mg/400  mL       See Hyperspace for full Linked Orders Report.   2,000 mg 200 mL/hr over 120 Minutes Intravenous  Once 01/13/22 1640 01/13/22 2029   01/13/22 1630  ceFEPIme (MAXIPIME) 2 g in sodium chloride 0.9 % 100 mL IVPB        2 g 200 mL/hr over 30 Minutes Intravenous  Once 01/13/22 1625 01/13/22 1705   01/13/22 1630  metroNIDAZOLE (FLAGYL) IVPB 500 mg        500 mg 100 mL/hr over 60 Minutes Intravenous  Once 01/13/22 1625 01/13/22 1826       Assessment/Plan: NSTI of R groin S/P Debridement of right medical thing and buttock 01/14/22 Dr. Okey Dupre - WBC normalized - Cxs w/  staph epi and actinomyces - ok to narrow to PO abx from a CCS perspective. - Wound looks good this AM, no need for further debridement. WTD BID.  FEN:Reg diet VTE: SQH ID: cefepime/flagyl Foley: pure wick to protect wound   - below per primary -  Morbid obesity - BMI 47 T2DM CKD stage IIIa HTN Peripheral neuropathy Lumbar disc disease with myelopathy R foot ulcer Wheelchair bound at baseline    LOS: 5 days    Jill Alexanders 01/18/2022

## 2022-01-18 NOTE — Progress Notes (Signed)
Physical Therapy Treatment Patient Details Name: [name] MRN: [id number] DOB: [birth year] Today's Date: 01/18/2022   History of Present Illness pt is a 61 y/o female presenting 6/23 with pain in her R thigh from boild PTA which had been treated with antibiotics.  In the ED, she had a CT scan which showed suspected necrotizing fascitis, s/p debridement of the right medial thigh and buttock, 6/24, respiratory complication post op with intubation.  Extubated 6/25.   PMHx:  DM, HTN, neuropathy, L heart cath and coronary angiography    PT Comments    Pt instructed in and performed gait and stair training. Pt limited by pain and fatigue during gait. Pt's cognition improving but she was unable to recall some information from yesterday's session. Progress as tolerated.    Recommendations for follow up therapy are one component of a multi-disciplinary discharge planning process, led by the attending physician.  Recommendations may be updated based on patient status, additional functional criteria and insurance authorization.  Follow Up Recommendations  Home health PT     Assistance Recommended at Discharge Intermittent Supervision/Assistance  Patient can return home with the following A little help with walking and/or transfers;A little help with bathing/dressing/bathroom;Assistance with cooking/housework;Assist for transportation;Help with stairs or ramp for entrance   Equipment Recommendations  None recommended by PT    Recommendations for Other Services       Precautions / Restrictions Precautions Precautions: Fall Precaution Comments: skin integrity; wound around outside of perineal area Restrictions Weight Bearing Restrictions: No     Mobility  Bed Mobility Overal bed mobility: Needs Assistance Bed Mobility: Supine to Sit     Supine to sit: Supervision (standby A)          Transfers Overall transfer level: Needs assistance Equipment used: Rolling walker (2  wheels) Transfers: Sit to/from Stand Sit to Stand: Min guard                Ambulation/Gait Ambulation/Gait assistance: Min guard Gait Distance (Feet): 20 Feet Assistive device: Rolling walker (2 wheels) Gait Pattern/deviations: Wide base of support, Decreased step length - left, Decreased step length - right, Antalgic   Gait velocity interpretation: <1.31 ft/sec, indicative of household ambulator   General Gait Details: Pt with slow pace and c/o back pain as distance increased. Pt required cues for pacing and chair followed behind her.   Stairs Stairs: Yes Stairs assistance: Min guard Stair Management: One rail Right, Forwards Number of Stairs: 2 General stair comments: Pt ascended and descended 2 steps respectively using R rail when ascending and L rail when descending to be specific to pt's home environment.   Wheelchair Mobility    Modified Rankin (Stroke Patients Only)       Balance Overall balance assessment: Needs assistance   Sitting balance-Leahy Scale: Good     Standing balance support: Single extremity supported Standing balance-Leahy Scale: Fair                              Cognition Arousal/Alertness: Awake/alert Behavior During Therapy: WFL for tasks assessed/performed Overall Cognitive Status: Impaired/Different from baseline Area of Impairment: Awareness                             Problem Solving: Slow processing General Comments: Pt A and O x 4        Exercises      General Comments General comments (  skin integrity, edema, etc.): VSS on RA      Pertinent Vitals/Pain Pain Assessment Pain Assessment: 0-10 Pain Score: 3  Pain Location: R posterior/medial thigh/buttock (when sitting in chair after mobility pt c/o pain) Pain Descriptors / Indicators:  (stinging) Pain Intervention(s): Limited activity within patient's tolerance, Monitored during session    Home Living                           Prior Function            PT Goals (current goals can now be found in the care plan section) Acute Rehab PT Goals PT Goal Formulation: With patient Time For Goal Achievement: 01/30/22 Potential to Achieve Goals: Good Progress towards PT goals: Progressing toward goals    Frequency    Min 3X/week      PT Plan Current plan remains appropriate    Co-evaluation              AM-PAC PT "6 Clicks" Mobility   Outcome Measure  Help needed turning from your back to your side while in a flat bed without using bedrails?: A Little Help needed moving from lying on your back to sitting on the side of a flat bed without using bedrails?: A Little Help needed moving to and from a bed to a chair (including a wheelchair)?: A Little Help needed standing up from a chair using your arms (e.g., wheelchair or bedside chair)?: A Little Help needed to walk in hospital room?: A Little Help needed climbing 3-5 steps with a railing? : A Little 6 Click Score: 18    End of Session Equipment Utilized During Treatment: Gait belt Activity Tolerance: Patient tolerated treatment well;Patient limited by fatigue Patient left: in chair;with chair alarm set;with call bell/phone within reach Nurse Communication: Mobility status PT Visit Diagnosis: Other abnormalities of gait and mobility (R26.89);Pain Pain - Right/Left: Right Pain - part of body:  (buttock/ R thigh)     Time: 6812-7517 PT Time Calculation (min) (ACUTE ONLY): 29 min  Charges:  $Gait Training: 8-22 mins $Therapeutic Activity: 8-22 mins                     Donna Bernard, PT    [facility] 01/18/2022, 12:45 PM

## 2022-01-19 DIAGNOSIS — M7989 Other specified soft tissue disorders: Secondary | ICD-10-CM | POA: Diagnosis not present

## 2022-01-19 LAB — BASIC METABOLIC PANEL
Anion gap: 10 (ref 5–15)
BUN: 17 mg/dL (ref 6–20)
CO2: 21 mmol/L — ABNORMAL LOW (ref 22–32)
Calcium: 7.9 mg/dL — ABNORMAL LOW (ref 8.9–10.3)
Chloride: 107 mmol/L (ref 98–111)
Creatinine, Ser: 0.95 mg/dL (ref 0.44–1.00)
GFR, Estimated: >60 mL/min (ref >60)
Glucose, Bld: 130 mg/dL — ABNORMAL HIGH (ref 70–99)
Potassium: 3.7 mmol/L (ref 3.5–5.1)
Sodium: 138 mmol/L (ref 135–145)

## 2022-01-19 LAB — GLUCOSE, CAPILLARY
Glucose-Capillary: 111 mg/dL — ABNORMAL HIGH (ref 70–99)
Glucose-Capillary: 118 mg/dL — ABNORMAL HIGH (ref 70–99)
Glucose-Capillary: 119 mg/dL — ABNORMAL HIGH (ref 70–99)
Glucose-Capillary: 153 mg/dL — ABNORMAL HIGH (ref 70–99)
Glucose-Capillary: 166 mg/dL — ABNORMAL HIGH (ref 70–99)

## 2022-01-19 LAB — AEROBIC/ANAEROBIC CULTURE W GRAM STAIN (SURGICAL/DEEP WOUND)

## 2022-01-19 LAB — CBC WITH DIFFERENTIAL/PLATELET
Abs Immature Granulocytes: 1.32 10*3/uL — ABNORMAL HIGH (ref 0.00–0.07)
Basophils Absolute: 0 10*3/uL (ref 0.0–0.1)
Basophils Relative: 0 %
Eosinophils Absolute: 0.3 10*3/uL (ref 0.0–0.5)
Eosinophils Relative: 4 %
HCT: 30 % — ABNORMAL LOW (ref 36.0–46.0)
Hemoglobin: 9.5 g/dL — ABNORMAL LOW (ref 12.0–15.0)
Immature Granulocytes: 14 %
Lymphocytes Relative: 17 %
Lymphs Abs: 1.6 10*3/uL (ref 0.7–4.0)
MCH: 29.6 pg (ref 26.0–34.0)
MCHC: 31.7 g/dL (ref 30.0–36.0)
MCV: 93.5 fL (ref 80.0–100.0)
Monocytes Absolute: 0.7 10*3/uL (ref 0.1–1.0)
Monocytes Relative: 8 %
Neutro Abs: 5.4 10*3/uL (ref 1.7–7.7)
Neutrophils Relative %: 57 %
Platelets: 253 10*3/uL (ref 150–400)
RBC: 3.21 MIL/uL — ABNORMAL LOW (ref 3.87–5.11)
RDW: 13.4 % (ref 11.5–15.5)
WBC: 9.4 10*3/uL (ref 4.0–10.5)
nRBC: 0.3 % — ABNORMAL HIGH (ref 0.0–0.2)

## 2022-01-19 MED ORDER — AMOXICILLIN 500 MG PO CAPS: 1000.0000 mg | ORAL_CAPSULE | Freq: Three times a day (TID) | ORAL | 2 refills | Status: DC

## 2022-01-19 MED ORDER — OXYCODONE-ACETAMINOPHEN 5-325 MG PO TABS
1.0000 | ORAL_TABLET | Freq: Once | ORAL | Status: AC
  Administered 2022-01-19: 1 via ORAL
  Filled 2022-01-19: qty 1

## 2022-01-19 MED ORDER — AMOXICILLIN-POT CLAVULANATE 875-125 MG PO TABS
1.0000 | ORAL_TABLET | Freq: Two times a day (BID) | ORAL | 0 refills | Status: AC
Start: 2022-01-18 — End: 2022-01-21

## 2022-01-19 MED ORDER — MEDIHONEY WOUND/BURN DRESSING EX PSTE: 1.0000 | PASTE | Freq: Every day | CUTANEOUS | 0 refills | Status: DC

## 2022-01-19 NOTE — Progress Notes (Signed)
Discharge instructions reviewed with pt.  Copy of instructions given to pt. Pt informed of scripts sent to her pharmacy.  Pt's daughter will be coming to pick pt up and received education on how to do wound care.

## 2022-01-19 NOTE — Progress Notes (Signed)
Occupational Therapy Treatment Patient Details Name: [name] MRN: [id number] DOB: January 31, 1961 Today's Date: 01/19/2022   History of present illness pt is a 61 y/o female presenting 6/23 with pain in her R thigh from boil PTA which had been treated with antibiotics.  In the ED, she had a CT scan which showed suspected necrotizing fascitis, s/p debridement of the right medial thigh and buttock, 6/24, respiratory complication post op with intubation.  Extubated 6/25.   PMHx:  DM, HTN, neuropathy, L heart cath and coronary angiography   OT comments  Pt ambulated to bathroom with RW with supervision, able to manage pericare following urinating. Completed grooming with set up. Pt remained up in chair eating breakfast at end of session.    Recommendations for follow up therapy are one component of a multi-disciplinary discharge planning process, led by the attending physician.  Recommendations may be updated based on patient status, additional functional criteria and insurance authorization.    Follow Up Recommendations  Home health OT    Assistance Recommended at Discharge Frequent or constant Supervision/Assistance  Patient can return home with the following  A little help with walking and/or transfers;A little help with bathing/dressing/bathroom;Assistance with cooking/housework;Help with stairs or ramp for entrance   Equipment Recommendations  Wheelchair cushion (measurements OT)    Recommendations for Other Services      Precautions / Restrictions Precautions Precautions: Fall Precaution Comments: skin integrity; wound around outside of perineal area Restrictions Weight Bearing Restrictions: No       Mobility Bed Mobility Overal bed mobility: Modified Independent             General bed mobility comments: HOB up    Transfers Overall transfer level: Needs assistance Equipment used: Rolling walker (2 wheels) Transfers: Sit to/from Stand Sit to Stand:  Supervision           General transfer comment: supervision for lines and safety from bed and toilet     Balance Overall balance assessment: Needs assistance   Sitting balance-Leahy Scale: Good       Standing balance-Leahy Scale: Poor Standing balance comment: ambulates with RW                           ADL either performed or assessed with clinical judgement   ADL Overall ADL's : Needs assistance/impaired Eating/Feeding: Independent   Grooming: Wash/dry hands;Wash/dry face;Sitting;Set up                   Toilet Transfer: Supervision/safety;Rolling walker (2 wheels);Comfort height toilet   Toileting- Clothing Manipulation and Hygiene: Set up;Sitting/lateral lean       Functional mobility during ADLs: Supervision/safety;Rolling walker (2 wheels)      Extremity/Trunk Assessment              Vision       Perception     Praxis      Cognition Arousal/Alertness: Awake/alert Behavior During Therapy: WFL for tasks assessed/performed Overall Cognitive Status: Within Functional Limits for tasks assessed                                          Exercises      Shoulder Instructions       General Comments      Pertinent Vitals/ Pain       Pain Assessment Pain Assessment: No/denies pain  Home Living                                          Prior Functioning/Environment              Frequency  Min 2X/week        Progress Toward Goals  OT Goals(current goals can now be found in the care plan section)  Progress towards OT goals: Progressing toward goals  Acute Rehab OT Goals OT Goal Formulation: With patient Time For Goal Achievement: 01/31/22 Potential to Achieve Goals: Good  Plan Discharge plan remains appropriate    Co-evaluation                 AM-PAC OT "6 Clicks" Daily Activity     Outcome Measure   Help from another person eating meals?: None Help from another  person taking care of personal grooming?: A Little Help from another person toileting, which includes using toliet, bedpan, or urinal?: A Little Help from another person bathing (including washing, rinsing, drying)?: A Lot Help from another person to put on and taking off regular upper body clothing?: A Little Help from another person to put on and taking off regular lower body clothing?: A Lot 6 Click Score: 17    End of Session Equipment Utilized During Treatment: Rolling walker (2 wheels)  OT Visit Diagnosis: Unsteadiness on feet (R26.81);Other abnormalities of gait and mobility (R26.89);Muscle weakness (generalized) (M62.81);Pain   Activity Tolerance Patient tolerated treatment well;Patient limited by pain   Patient Left in chair;with call bell/phone within reach;with chair alarm set   Nurse Communication          Time: [phone] OT Time Calculation (min): 23 min  Charges: OT General Charges $OT Visit: 1 Visit OT Treatments $Self Care/Home Management : 23-37 mins  [name], OTR/L Acute Rehabilitation Services Office: [phone]   [name] 01/19/2022, 8:57 AM

## 2022-01-19 NOTE — Discharge Summary (Signed)
Physician Discharge Summary  [name] VEL:[id number] DOB: [birth year] DOA: 01/13/2022  PCP: Teressa Senter, FNP  Admit date: 01/13/2022 Discharge date: 01/19/2022  Admitted From: home Discharge disposition: home   Recommendations for Outpatient Follow-Up:   Wound care Home health ID follow up Abx for 3 months Referral to nephrology/urology: re: kidney lesion   Discharge Diagnosis:   Principal Problem:   Necrotizing soft tissue infection Active Problems:   Type 2 diabetes mellitus ([place])   Obesity, Class III, BMI 40-49.9 (morbid obesity) ([place])   Stage 3a chronic kidney disease (CKD) ([place])   Sepsis ([place])   Postoperative respiratory failure ([place])   Pressure injury of skin   Necrotizing fasciitis ([place])   Actinomyces infection    Discharge Condition: Improved.  Diet recommendation: Low sodium, heart healthy.  Carbohydrate-modified  Wound care: see below  Code status: Full.   History of Present Illness:   [name] is a 61 y.o. female with medical history significant for morbid obesity, CKD stage IV, hypertension, type 2 diabetes, and neuropathy who presented to the ED with noted low back and flank pain that suddenly started on Monday of this week.  She denies any trauma or particular aggravating or alleviating factors, but states that the pain was dull and achy and radiated down her right lower extremity into her foot.  She normally is wheelchair-bound, but does tend to ambulate to go to the bathroom and states that she has been having a more difficult time weightbearing on her left lower extremity and is complaining of some weakness and unsteadiness.  She denies any bowel or bladder incontinence.  She has a sore on the dorsum of her right foot with some weeping drainage occasionally that she states was supposed to be addressed by a provider at an upcoming appointment.  No new fevers or chills noted and she states that she is compliant with her home  medications.  She denies any alcohol or tobacco use   Hospital Course by Problem:   Sepsis on admission 2/2 abscess status postdebridement 6/24 General surgery Continue on Flagyl and cefepime discontinue linezolide--growing staph epi-- follow culture from 6/23 to completion. Continue wet-to-dry dressings as per surgery who saw the patient-they reevaluated the wound on 6/26 and felt no surgery was needed -ID consult: Start amox-clav 875 mg po bid for 3 more days until 7/01 on which date can transition to amoxicillin 750 mg po tid for planned 3 months (shorter or longer course depends on further clinic evaluation) Id clinic follow up arranged for 7/13 @ 1045   Recent hospitalization low back pain-is wheelchair-bound Evaluated at the time by neurosurgery and needs outpatient follow-up for consideration of steroids once sepsis physiology is resolved   Hyponatremia on admission -resolved   AKI on admission superimposed on CKD 3B-peak creatinine 2.2 Likely combination of sepsis Bactrim diuretics and ACE inhibitor PTA    2.7 low-density lesion left lower kidney Will need referral from PCP to outpatient nephrology   DM TY 2 with neuropathy nephropathy -resume home meds     Clean cardiac cath 2018 Hold GDMT prazosin enalapril at this time-reassess as an outpatient for resumption   Depression Resume Cymbalta 60 twice daily discontinue Lexapro 10 daily Unclear indication for Tegretol 200 twice daily would hold at this time would also hold hydroxyzine 25 3 times daily   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 Affects all aspects of care likely poor healing if not careful-outpatient counseling    Medical Consultants:   ID  Stewart GS    Discharge Exam:   Vitals:   01/19/22 0900 01/19/22 1000  BP:    Pulse: 83 80  Resp: 18 18  Temp:    SpO2: 98% 99%   Vitals:   01/19/22 0754 01/19/22 0800 01/19/22 0900 01/19/22 1000  BP: (!) 149/53     Pulse:  79 83 80  Resp:  '18 18 18  '$ Temp: 98.3 F (36.8  C)     TempSrc:      SpO2:  97% 98% 99%  Weight:      Height:        General exam: Appears calm and comfortable.   The results of significant diagnostics from this hospitalization (including imaging, microbiology, ancillary and laboratory) are listed below for reference.     Procedures and Diagnostic Studies:   DG Abd Portable 1V  Result Date: 01/14/2022 CLINICAL DATA:  Check gastric catheter placement EXAM: PORTABLE ABDOMEN - 1 VIEW COMPARISON:  None Available. FINDINGS: Gastric catheter is noted within the stomach. Scattered large and small bowel gas is noted. IMPRESSION: Gastric catheter within the stomach. Electronically Signed   By: Inez Catalina M.D.   On: 01/14/2022 02:17   DG CHEST PORT 1 VIEW  Result Date: 01/14/2022 CLINICAL DATA:  Respiratory failure EXAM: PORTABLE CHEST 1 VIEW COMPARISON:  Film from the previous day. FINDINGS: Cardiac shadow is stable. Bibasilar atelectatic changes are noted secondary to a poor inspiratory effort. Endotracheal tube and gastric catheter are now seen in satisfactory position. Stable right jugular central line is noted. No bony abnormality is seen. IMPRESSION: Tubes and lines in satisfactory position. Bibasilar atelectatic changes related to a poor inspiratory effort. Electronically Signed   By: Inez Catalina M.D.   On: 01/14/2022 02:17   DG Chest 1 View  Result Date: 01/13/2022 CLINICAL DATA:  Check central line placement EXAM: PORTABLE CHEST 1 VIEW COMPARISON:  07/11/2017 FINDINGS: Right jugular central line is noted with the catheter tip at the cavoatrial junction. Cardiac shadow is within normal limits. The overall inspiratory effort is poor. No pneumothorax is seen. No acute bony abnormality is noted. IMPRESSION: No pneumothorax following central line placement. Electronically Signed   By: Inez Catalina M.D.   On: 01/13/2022 21:41   CT FEMUR RIGHT WO CONTRAST  Result Date: 01/13/2022 CLINICAL DATA:  Soft tissue infection suspected of the  thigh. No prior imaging. Patient reports abscess on inner right thigh. EXAM: CT OF THE LOWER RIGHT EXTREMITY WITHOUT CONTRAST TECHNIQUE: Multidetector CT imaging of the right lower extremity was performed according to the standard protocol. RADIATION DOSE REDUCTION: This exam was performed according to the departmental dose-optimization program which includes automated exposure control, adjustment of the mA and/or kV according to patient size and/or use of iterative reconstruction technique. COMPARISON:  CT abdomen and pelvis 12/22/2021 FINDINGS: Bones/Joint/Cartilage There is moderate chronic enthesopathic change at the gluteus minimus and gluteus medius insertions on the greater trochanter of the proximal right femur. Mild-to-moderate left femoroacetabular joint space narrowing. Minimal patellofemoral joint space narrowing. Minimal peripheral patellar degenerative spurring. Moderate medial compartment of the knee joint space narrowing with mild peripheral degenerative osteophytes. Mild pubic symphysis joint space narrowing and subchondral sclerosis degenerative change. Ligaments Suboptimally assessed by CT. Muscles and Tendons There is mild edema around the gracilis musculature likely related to the more medial thigh inflammatory changes. Normal density in size of the right thigh musculature. Soft tissues No knee joint effusion. Moderate to high-grade atherosclerotic calcifications. There is edema and swelling of the posteromedial  right thigh diffusely. This is greatest at the region of the proximal posteromedial right thigh and buttock subcutaneous fat, just posteromedial to the proximal gracilis musculature (axial series 13, images 71 through 120). There is scattered air throughout the subcutaneous fat lobules and edema in this region. No well-defined walled-off border of an abscess or other fluid collection, however the general region of inflammation and subcutaneous air measures up to approximately 5.3 cm in  greatest transverse dimension, 9.1 cm in greatest AP dimension (as measured on axial series 13, image 93), and 9.6 cm in craniocaudal dimension (as measured on coronal images). The visualized urinary bladder is unremarkable. IMPRESSION: 1. There are inflammatory changes within the right buttock and adjacent posteromedial right thigh subcutaneous fat including edema swelling, and subcutaneous air. This is suspicious for a gas producing infectious organism an infectious cellulitis. No walled-off abscess is seen. 2. No CT evidence of osteomyelitis. Electronically Signed   By: Yvonne Kendall M.D.   On: 01/13/2022 17:47   CT PELVIS WO CONTRAST  Result Date: 01/13/2022 CLINICAL DATA:  Soft tissue infection. EXAM: CT PELVIS WITHOUT CONTRAST TECHNIQUE: Multidetector CT imaging of the pelvis was performed following the standard protocol without intravenous contrast. RADIATION DOSE REDUCTION: This exam was performed according to the departmental dose-optimization program which includes automated exposure control, adjustment of the mA and/or kV according to patient size and/or use of iterative reconstruction technique. COMPARISON:  None Available. FINDINGS: Urinary Tract:  No abnormality visualized. Bowel:  No acute findings.  There are scattered colonic diverticula. Vascular/Lymphatic: There are atherosclerotic calcifications of aorta and iliac arteries. Collateral vessels are seen in the subcutaneous tissues of the anterior abdominal wall. There are enlarged left inguinal lymph nodes measuring up to 12 mm short axis. There are nonenlarged pelvic and right inguinal lymph nodes. Reproductive:  Status post hysterectomy.  Adnexa are unremarkable. Other:  There is no ascites or focal abdominal wall hernia. Musculoskeletal: There is superficial and deep subcutaneous edema and air identified in the right gluteal region inferiorly. No focal fluid collection identified. Deep fat planes appear preserved. No acute osseous findings.  IMPRESSION: 1. Superficial and deep subcutaneous edema and air in the inferior right gluteal region concerning for infection/cellulitis. No focal abscess identified. Electronically Signed   By: Ronney Asters M.D.   On: 01/13/2022 17:44     Labs:   Basic Metabolic Panel: Recent Labs  Lab 01/14/22 0213 01/15/22 0307 01/16/22 0358 01/17/22 0305 01/18/22 0305 01/19/22 0440  NA 128* 133* 134* 137 141 138  K 5.0 4.0 3.8 3.7 3.7 3.7  CL 99 100 103 106 111 107  CO2 17* 20* 22 20* 21* 21*  GLUCOSE 379* 121* 127* 151* 96 130*  BUN 40* 51* 48* 37* 23* 17  CREATININE 2.13* 2.35* 1.99* 1.39* 1.08* 0.95  CALCIUM 7.5* 8.2* 7.8* 7.9* 8.0* 7.9*  MG 2.0  --   --  2.4  --   --   PHOS 3.8  --   --   --   --   --    GFR Estimated Creatinine Clearance: 83.3 mL/min (by C-G formula based on SCr of 0.95 mg/dL). Liver Function Tests: Recent Labs  Lab 01/13/22 1438 01/17/22 0305  AST 23 14*  ALT 19 11  ALKPHOS 144* 107  BILITOT 1.5* 0.6  PROT 7.5 5.7*  ALBUMIN 3.0* 2.0*   No results for input(s): "LIPASE", "AMYLASE" in the last 168 hours. No results for input(s): "AMMONIA" in the last 168 hours. Coagulation profile Recent Labs  Lab 01/13/22 1438  INR 1.2    CBC: Recent Labs  Lab 01/13/22 1438 01/14/22 0138 01/15/22 0307 01/16/22 0358 01/17/22 0305 01/18/22 0305 01/19/22 0440  WBC 13.1*   < > 12.7* 9.0 8.3 9.8 9.4  NEUTROABS 11.0*  --   --   --  6.6 6.1 5.4  HGB 11.8*   < > 9.7* 8.7* 9.5* 9.6* 9.5*  HCT 36.4   < > 29.1* 27.5* 29.5* 30.3* 30.0*  MCV 91.2   < > 91.2 91.7 93.1 93.5 93.5  PLT 210   < > 204 205 243 259 253   < > = values in this interval not displayed.   Cardiac Enzymes: No results for input(s): "CKTOTAL", "CKMB", "CKMBINDEX", "TROPONINI" in the last 168 hours. BNP: Invalid input(s): "POCBNP" CBG: Recent Labs  Lab 01/18/22 1505 01/18/22 2028 01/19/22 0007 01/19/22 0356 01/19/22 0737  GLUCAP 148* 136* 111* 118* 119*   D-Dimer No results for input(s):  "DDIMER" in the last 72 hours. Hgb A1c No results for input(s): "HGBA1C" in the last 72 hours. Lipid Profile No results for input(s): "CHOL", "HDL", "LDLCALC", "TRIG", "CHOLHDL", "LDLDIRECT" in the last 72 hours. Thyroid function studies No results for input(s): "TSH", "T4TOTAL", "T3FREE", "THYROIDAB" in the last 72 hours.  Invalid input(s): "FREET3" Anemia work up No results for input(s): "VITAMINB12", "FOLATE", "FERRITIN", "TIBC", "IRON", "RETICCTPCT" in the last 72 hours. Microbiology Recent Results (from the past 240 hour(s))  Culture, blood (Routine x 2)     Status: None   Collection Time: 01/13/22  2:53 PM   Specimen: BLOOD RIGHT HAND  Result Value Ref Range Status   Specimen Description   Final    BLOOD RIGHT HAND BOTTLES DRAWN AEROBIC AND ANAEROBIC   Special Requests   Final    Blood Culture results may not be optimal due to an excessive volume of blood received in culture bottles   Culture   Final    NO GROWTH 5 DAYS Performed at [facility], [address] [place], [place] [address]    Report Status 01/18/2022 FINAL  Final  Culture, blood (Routine x 2)     Status: None   Collection Time: 01/13/22  2:55 PM   Specimen: Right Antecubital; Blood  Result Value Ref Range Status   Specimen Description   Final    RIGHT ANTECUBITAL BOTTLES DRAWN AEROBIC AND ANAEROBIC   Special Requests   Final    Blood Culture results may not be optimal due to an excessive volume of blood received in culture bottles   Culture   Final    NO GROWTH 5 DAYS Performed at [facility], [address] [place], [place] [address]    Report Status 01/18/2022 FINAL  Final  Aerobic/Anaerobic Culture w Gram Stain (surgical/deep wound)     Status: None (Preliminary result)   Collection Time: 01/13/22  8:30 PM   Specimen: PATH Other; Body Fluid  Result Value Ref Range Status   Specimen Description   Final    THIGH Performed at [facility], [address] [place], [place] [address]    Special  Requests   Final    NONE Performed at [facility], [address], [place], [place] [address]    Gram Stain   Final    RARE WBC PRESENT,BOTH PMN AND MONONUCLEAR ABUNDANT GRAM POSITIVE COCCI IN PAIRS ABUNDANT GRAM NEGATIVE RODS FEW GRAM POSITIVE RODS Performed at [facility], [place] [address], [place], [place] [address]    Culture  Final    RARE ACTINOMYCES SPECIES Standardized susceptibility testing for this organism is not available. RARE STAPHYLOCOCCUS EPIDERMIDIS NO ANAEROBES ISOLATED; CULTURE IN PROGRESS FOR 5 DAYS    Report Status PENDING  Incomplete   Organism ID, Bacteria STAPHYLOCOCCUS EPIDERMIDIS  Final      Susceptibility   Staphylococcus epidermidis - MIC*    CIPROFLOXACIN >=8 RESISTANT Resistant     ERYTHROMYCIN >=8 RESISTANT Resistant     GENTAMICIN <=0.5 SENSITIVE Sensitive     OXACILLIN >=4 RESISTANT Resistant     TETRACYCLINE <=1 SENSITIVE Sensitive     VANCOMYCIN 2 SENSITIVE Sensitive     TRIMETH/SULFA 20 SENSITIVE Sensitive     CLINDAMYCIN >=8 RESISTANT Resistant     RIFAMPIN <=0.5 SENSITIVE Sensitive     Inducible Clindamycin NEGATIVE Sensitive     * RARE STAPHYLOCOCCUS EPIDERMIDIS  MRSA Next Gen by PCR, Nasal     Status: Abnormal   Collection Time: 01/14/22  1:30 AM   Specimen: Nasal Mucosa; Nasal Swab  Result Value Ref Range Status   MRSA by PCR Next Gen DETECTED (A) NOT DETECTED Final    Comment: RESULT CALLED TO, READ BACK BY AND VERIFIED WITH: OWNLEY,RN'@0247'$  01/14/22 [place] (NOTE) The GeneXpert MRSA Assay (FDA approved for NASAL specimens only), is one component of a comprehensive MRSA colonization surveillance program. It is not intended to diagnose MRSA infection nor to guide or monitor treatment for MRSA infections. Test performance is not FDA approved in patients less than 109 years old. Performed at [facility], [place] [address] [place], [place] [address]      Discharge Instructions:   Discharge Instructions     Diet Carb  Modified   Complete by: As directed    Discharge instructions   Complete by: As directed    Home health Bring log of blood sugar to PCP   Discharge wound care:   Complete by: As directed    BID saline wet to dry dressing to R groin wound. Change PRN if soiled with BM   Daily: Wound care to right foot, plantar aspect full thickness wound: Cleanse lesion with soap and water, rinse and dry. Apply Medihoney in a 1/8 inch layer, top with saline moistened guaze 2x2, top with dry gauze and secure with Kerlix roll gauze/paper tape.   Increase activity slowly   Complete by: As directed       Allergies as of 01/19/2022       Reactions   Ibuprofen Swelling   Red Dye    Other reaction(s): Nervous syncope   Codeine Palpitations   Nitrofurantoin Diarrhea, Nausea And Vomiting        Medication List     STOP taking these medications    carbamazepine 200 MG 12 hr capsule Commonly known as: CARBATROL   enalapril 10 MG tablet Commonly known as: VASOTEC   hydrOXYzine 25 MG capsule Commonly known as: VISTARIL   Lantus SoloStar 100 UNIT/ML Solostar Pen Generic drug: insulin glargine   pioglitazone 15 MG tablet Commonly known as: ACTOS   sulfamethoxazole-trimethoprim 400-80 MG tablet Commonly known as: BACTRIM   tiZANidine 4 MG tablet Commonly known as: ZANAFLEX   Tresiba 100 UNIT/ML Soln Generic drug: Insulin Degludec       TAKE these medications    acetaminophen 325 MG tablet Commonly known as: Tylenol Take 2 tablets (650 mg total) by mouth every 6 (six) hours as needed for moderate pain or fever.   amoxicillin 500 MG capsule Commonly known as: AMOXIL Take  2 capsules (1,000 mg total) by mouth every 8 (eight) hours. Start taking on: January 22, 2022   amoxicillin-clavulanate 875-125 MG tablet Commonly known as: AUGMENTIN Take 1 tablet by mouth every 12 (twelve) hours for 3 days.   dicyclomine 20 MG tablet Commonly known as: BENTYL Take 20 mg by mouth 4 (four) times  daily as needed for cramping.   DULoxetine 60 MG capsule Commonly known as: CYMBALTA Take 60 mg by mouth 2 (two) times daily.   escitalopram 10 MG tablet Commonly known as: LEXAPRO Take 10 mg by mouth daily.   furosemide 40 MG tablet Commonly known as: Lasix Take 1 tablet (40 mg total) by mouth daily. Start 11/25/2021   gabapentin 600 MG tablet Commonly known as: NEURONTIN Take 0.5 tablets (300 mg total) by mouth 3 (three) times daily. What changed:  how much to take when to take this   gentamicin cream 0.1 % Commonly known as: GARAMYCIN Apply 1 Application topically 2 (two) times daily.   leptospermum manuka honey Pste paste Apply 1 Application topically daily.   Mounjaro 5 MG/0.5ML Pen Generic drug: tirzepatide Inject 10 mg into the skin once a week.   NovoLOG ReliOn 100 UNIT/ML injection Generic drug: insulin aspart Inject 4-20 Units into the skin 3 (three) times daily with meals. Per sliding scale:70-130; 0 units, 130 to 180; 4 units; 180-240; 8 units; 241-300 ; 10 units; 301-350; 12 units; 351-400; 16 units. > 400 20 units and call MD   prazosin 2 MG capsule Commonly known as: MINIPRESS Take 2 mg by mouth at bedtime.               Discharge Care Instructions  (From admission, onward)           Start     Ordered   01/19/22 0000  Discharge wound care:       Comments: BID saline wet to dry dressing to R groin wound. Change PRN if soiled with BM   Daily: Wound care to right foot, plantar aspect full thickness wound: Cleanse lesion with soap and water, rinse and dry. Apply Medihoney in a 1/8 inch layer, top with saline moistened guaze 2x2, top with dry gauze and secure with Kerlix roll gauze/paper tape.   01/19/22 1040            Follow-up Information     [name], Ander Purpura, FNP Follow up.   Specialty: Family Medicine Contact information: [address] [place]  [address] [phone]                  Time  coordinating discharge:  45 Signed:  Geradine Girt DO  [facility] 01/19/2022, 10:41 AM

## 2022-01-19 NOTE — TOC Transition Note (Signed)
Transition of Care [facility]) - CM/SW Discharge Note   Patient Details  Name: [name] MRN: [id number] Date of Birth: 04/18/1961  Transition of Care [facility]) CM/SW Contact:  Sharin Mons, RN Phone Number: 01/19/2022, 11:49 AM   Clinical Narrative:    Patient will DC to: home  Anticipated DC date: 01/19/2022 Family notified: yes, Recruitment consultant by: Secretary/administrator. Necrotizing fascitis, s/p debridement of the right medial thigh and buttock, 6/24 Per MD patient ready for DC today. RN, patient, patient's family, and [facility]  notified of DC. Pt resides with daughter [name], however, will transition to daughter [name] residence until [place] gets back in town (<2 days). Pt agreeable to the resumption of home health services with [facility]. Bedside nurse to teach daughter how to do dressing change prior to d/c, and provide pt with extra drsg supplies to go home with.  Pt without DME  needs, has RW @ home. Pt without Rx med concerns.   Post hospital f/u noted on AVS.  [name] ( daughter) to provide transportation to home.   RNCM will sign off for now as intervention is no longer needed. Please consult [place] again if new needs arise.    [name] Daughter [phone]) [address] [place] [address] [name] Daughter [phone]  Barriers to Discharge: No Barriers Identified   Patient Goals and CMS Choice     Choice offered to / list presented to : Patient  Discharge Placement                       Discharge Plan and Services                          HH Arranged: RN, PT, OT [facility] Agency: [place] Date [place]: 01/19/22 Time [place]: 2633 Representative spoke with at [place]: [place] ([place]) Interventions     Readmission Risk Interventions    01/19/2022   11:46 AM  Readmission Risk Prevention Plan  Transportation Screening  Complete  PCP or Specialist Appt within 5-7 Days Complete  Home Care Screening Complete  Medication Review (RN CM) Complete

## 2022-01-23 ENCOUNTER — Telehealth: Payer: Self-pay

## 2022-01-23 NOTE — Telephone Encounter (Signed)
Patient called stating that when she was discharged from the hospital she wasn't prescribed any pain medication. I advised patient that we don't typically prescribe pain medication and she should reach out to her PCP. Patient verbalized her understanding.    [place], CMA

## 2022-02-02 ENCOUNTER — Inpatient Hospital Stay: Payer: Medicare HMO | Admitting: Internal Medicine

## 2022-02-06 ENCOUNTER — Other Ambulatory Visit: Payer: Self-pay

## 2022-02-06 ENCOUNTER — Emergency Department (HOSPITAL_COMMUNITY): Payer: Medicare HMO

## 2022-02-06 ENCOUNTER — Emergency Department (HOSPITAL_COMMUNITY)
Admission: EM | Admit: 2022-02-06 | Discharge: 2022-02-07 | Disposition: A | Discharge status: Home / Self Care | Payer: Medicare HMO | Attending: Emergency Medicine | Admitting: Emergency Medicine

## 2022-02-06 ENCOUNTER — Encounter (HOSPITAL_COMMUNITY): Payer: Self-pay | Admitting: Emergency Medicine

## 2022-02-06 DIAGNOSIS — E1140 Type 2 diabetes mellitus with diabetic neuropathy, unspecified: Secondary | ICD-10-CM | POA: Diagnosis not present

## 2022-02-06 DIAGNOSIS — N1831 Chronic kidney disease, stage 3a: Secondary | ICD-10-CM | POA: Diagnosis not present

## 2022-02-06 DIAGNOSIS — I129 Hypertensive chronic kidney disease with stage 1 through stage 4 chronic kidney disease, or unspecified chronic kidney disease: Secondary | ICD-10-CM | POA: Diagnosis not present

## 2022-02-06 DIAGNOSIS — E1122 Type 2 diabetes mellitus with diabetic chronic kidney disease: Secondary | ICD-10-CM | POA: Insufficient documentation

## 2022-02-06 DIAGNOSIS — N2889 Other specified disorders of kidney and ureter: Secondary | ICD-10-CM | POA: Diagnosis not present

## 2022-02-06 DIAGNOSIS — M545 Low back pain, unspecified: Secondary | ICD-10-CM | POA: Insufficient documentation

## 2022-02-06 DIAGNOSIS — Z4800 Encounter for change or removal of nonsurgical wound dressing: Secondary | ICD-10-CM | POA: Insufficient documentation

## 2022-02-06 DIAGNOSIS — T1490XD Injury, unspecified, subsequent encounter: Secondary | ICD-10-CM

## 2022-02-06 HISTORY — DX: Disorder of kidney and ureter, unspecified: N28.9

## 2022-02-06 LAB — CBC WITH DIFFERENTIAL/PLATELET
Abs Immature Granulocytes: 0.04 10*3/uL (ref 0.00–0.07)
Basophils Absolute: 0 10*3/uL (ref 0.0–0.1)
Basophils Relative: 1 %
Eosinophils Absolute: 0.2 10*3/uL (ref 0.0–0.5)
Eosinophils Relative: 3 %
HCT: 31.9 % — ABNORMAL LOW (ref 36.0–46.0)
Hemoglobin: 10 g/dL — ABNORMAL LOW (ref 12.0–15.0)
Immature Granulocytes: 1 %
Lymphocytes Relative: 27 %
Lymphs Abs: 1.6 10*3/uL (ref 0.7–4.0)
MCH: 29.4 pg (ref 26.0–34.0)
MCHC: 31.3 g/dL (ref 30.0–36.0)
MCV: 93.8 fL (ref 80.0–100.0)
Monocytes Absolute: 0.7 10*3/uL (ref 0.1–1.0)
Monocytes Relative: 12 %
Neutro Abs: 3.4 10*3/uL (ref 1.7–7.7)
Neutrophils Relative %: 56 %
Platelets: 171 10*3/uL (ref 150–400)
RBC: 3.4 MIL/uL — ABNORMAL LOW (ref 3.87–5.11)
RDW: 15 % (ref 11.5–15.5)
WBC: 6 10*3/uL (ref 4.0–10.5)
nRBC: 0 % (ref 0.0–0.2)

## 2022-02-06 LAB — COMPREHENSIVE METABOLIC PANEL
ALT: 15 U/L (ref 0–44)
AST: 17 U/L (ref 15–41)
Albumin: 3 g/dL — ABNORMAL LOW (ref 3.5–5.0)
Alkaline Phosphatase: 80 U/L (ref 38–126)
Anion gap: 7 (ref 5–15)
BUN: 37 mg/dL — ABNORMAL HIGH (ref 6–20)
CO2: 24 mmol/L (ref 22–32)
Calcium: 8.2 mg/dL — ABNORMAL LOW (ref 8.9–10.3)
Chloride: 108 mmol/L (ref 98–111)
Creatinine, Ser: 1.33 mg/dL — ABNORMAL HIGH (ref 0.44–1.00)
GFR, Estimated: 46 mL/min — ABNORMAL LOW (ref >60)
Glucose, Bld: 136 mg/dL — ABNORMAL HIGH (ref 70–99)
Potassium: 3.9 mmol/L (ref 3.5–5.1)
Sodium: 139 mmol/L (ref 135–145)
Total Bilirubin: 0.6 mg/dL (ref 0.3–1.2)
Total Protein: 7.1 g/dL (ref 6.5–8.1)

## 2022-02-06 MED ORDER — IOHEXOL 300 MG/ML  SOLN
100.0000 mL | Freq: Once | INTRAMUSCULAR | Status: AC | PRN
  Administered 2022-02-06: 100 mL via INTRAVENOUS

## 2022-02-06 MED ORDER — SODIUM CHLORIDE 0.9 % IV BOLUS
1000.0000 mL | Freq: Once | INTRAVENOUS | Status: AC
  Administered 2022-02-06: 1000 mL via INTRAVENOUS

## 2022-02-06 MED ORDER — HYDROMORPHONE HCL 1 MG/ML IJ SOLN
1.0000 mg | Freq: Once | INTRAMUSCULAR | Status: AC
  Administered 2022-02-06: 1 mg via INTRAVENOUS
  Filled 2022-02-06: qty 1

## 2022-02-06 MED ORDER — ONDANSETRON HCL 4 MG/2ML IJ SOLN
4.0000 mg | Freq: Once | INTRAMUSCULAR | Status: AC
  Administered 2022-02-06: 4 mg via INTRAVENOUS
  Filled 2022-02-06: qty 2

## 2022-02-06 MED ORDER — SODIUM CHLORIDE 0.9 % IV SOLN: INTRAVENOUS | Status: DC

## 2022-02-06 MED ORDER — OXYCODONE-ACETAMINOPHEN 5-325 MG PO TABS: 1.0000 | ORAL_TABLET | Freq: Four times a day (QID) | ORAL | 0 refills | Status: DC | PRN

## 2022-02-06 NOTE — ED Provider Notes (Incomplete)
[facility] Provider Note   CSN: [id number] Arrival date & time: 02/06/22  1800     History {Add pertinent medical, surgical, social history, OB history to HPI:1} Chief Complaint  Patient presents with  . Leg Pain    Post op leg pain    [name] is a 61 y.o. female.  Patient here with a complaint of wound pain where she had necrotizing fasciitis that was kind of the right groin area.  Patient with complaint of some sharp right-sided back pain that radiates around to the right lower quadrant and some into the thigh area.  No numbness or weakness to the right foot.  Patient was admitted June 23 through June 29 for necrotizing fasciitis of soft tissue area of air in the right groin area.  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  Patient was discharged after treatment in the hospital was discharged with Augmentin until July 1 and then she was switched to amoxicillin 750 3 times daily for 3 months as per infectious disease.  Patient was to follow-up with infectious disease on July 13 but patient missed that appointment.  Past medical history significant for the diabetes hypertension neuropathy.  Surgical history significant for gallbladder removal abdominal hysterectomy appendectomy left have a cath in 2018 and then had irrigation and debridement of the buttocks kind of groin area on January 13, 2022.  That was done by surgery here.       Home Medications Prior to Admission medications   Medication Sig Start Date End Date Taking? Authorizing Provider  acetaminophen (TYLENOL) 325 MG tablet Take 2 tablets (650 mg total) by mouth every 6 (six) hours as needed for moderate pain or fever. 10/09/21   Jaynee Eagles, PA-C  amoxicillin (AMOXIL) 500 MG capsule Take 2 capsules (1,000 mg total) by mouth every 8 (eight) hours. 01/22/22   Geradine Girt, DO  dicyclomine (BENTYL) 20 MG tablet Take 20 mg by mouth 4 (four) times daily as needed for cramping.  01/03/22   [provider]  DULoxetine (CYMBALTA) 60 MG capsule Take 60 mg by mouth 2 (two) times daily.     [provider]  escitalopram (LEXAPRO) 10 MG tablet Take 10 mg by mouth daily. 02/15/21   [provider]  furosemide (LASIX) 40 MG tablet Take 1 tablet (40 mg total) by mouth daily. Start 11/25/2021 11/25/21 11/25/22  Roxan Hockey, MD  gabapentin (NEURONTIN) 600 MG tablet Take 0.5 tablets (300 mg total) by mouth 3 (three) times daily. Patient taking differently: Take 600 mg by mouth in the morning, at noon, in the evening, and at bedtime. 05/18/21   Kathie Dike, MD  gentamicin cream (GARAMYCIN) 0.1 % Apply 1 Application topically 2 (two) times daily. 12/30/21   [provider]  leptospermum manuka honey (MEDIHONEY) PSTE paste Apply 1 Application topically daily. 01/19/22   Geradine Girt, DO  MOUNJARO 5 MG/0.5ML Pen Inject 10 mg into the skin once a week. 11/23/21   [provider]  NOVOLOG RELION 100 UNIT/ML injection Inject 4-20 Units into the skin 3 (three) times daily with meals. Per sliding scale:70-130; 0 units, 130 to 180; 4 units; 180-240; 8 units; 241-300 ; 10 units; 301-350; 12 units; 351-400; 16 units. > 400 20 units and call MD 10/31/21   [provider]  prazosin (MINIPRESS) 2 MG capsule Take 2 mg by mouth at bedtime. 03/15/21   [provider]      Allergies  Ibuprofen, Red dye, Codeine, and Nitrofurantoin    Review of Systems   Review of Systems  Physical Exam Updated Vital Signs BP (!) 113/48   Pulse 96   Temp 98.6 F (37 C) (Oral)   Resp 18   Ht 1.626 m ('5\' 4"'$ )   Wt 115.7 kg   SpO2 97%   BMI 43.77 kg/m  Physical Exam  ED Results / Procedures / Treatments   Labs (all labs ordered are listed, but only abnormal results are displayed) Labs Reviewed - No data to display  EKG None  Radiology No results found.  Procedures Procedures  {Document cardiac monitor, telemetry assessment procedure  when appropriate:1}  Medications Ordered in ED Medications - No data to display  ED Course/ Medical Decision Making/ A&P                           Medical Decision Making Amount and/or Complexity of Data Reviewed Labs: ordered. Radiology: ordered.  Risk Prescription drug management.   ***  {Document critical care time when appropriate:1} {Document review of labs and clinical decision tools ie heart score, Chads2Vasc2 etc:1}  {Document your independent review of radiology images, and any outside records:1} {Document your discussion with family members, caretakers, and with consultants:1} {Document social determinants of health affecting pt's care:1} {Document your decision making why or why not admission, treatments were needed:1} Final Clinical Impression(s) / ED Diagnoses Final diagnoses:  None    Rx / DC Orders ED Discharge Orders     None

## 2022-02-06 NOTE — ED Notes (Signed)
Patient transported to CT 

## 2022-02-06 NOTE — ED Notes (Signed)
Pt's O2 sats 76% on room air after receiving IV Dilaudid. Pt sleeping and arousable to voice. Placed pt on O2 @ 2L nasal cannula. Will continue to monitor.

## 2022-02-06 NOTE — ED Provider Notes (Signed)
[facility] EMERGENCY DEPARTMENT Provider Note   CSN: [id number] Arrival date & time: 02/06/22  1800     History {Add pertinent medical, surgical, social history, OB history to HPI:1} Chief Complaint  Patient presents with   Leg Pain    Post op leg pain    [name] is a 61 y.o. female.  HPI     Home Medications Prior to Admission medications   Medication Sig Start Date End Date Taking? Authorizing Provider  acetaminophen (TYLENOL) 325 MG tablet Take 2 tablets (650 mg total) by mouth every 6 (six) hours as needed for moderate pain or fever. 10/09/21   Jaynee Eagles, PA-C  amoxicillin (AMOXIL) 500 MG capsule Take 2 capsules (1,000 mg total) by mouth every 8 (eight) hours. 01/22/22   Geradine Girt, DO  dicyclomine (BENTYL) 20 MG tablet Take 20 mg by mouth 4 (four) times daily as needed for cramping. 01/03/22   [provider]  DULoxetine (CYMBALTA) 60 MG capsule Take 60 mg by mouth 2 (two) times daily.     [provider]  escitalopram (LEXAPRO) 10 MG tablet Take 10 mg by mouth daily. 02/15/21   [provider]  furosemide (LASIX) 40 MG tablet Take 1 tablet (40 mg total) by mouth daily. Start 11/25/2021 11/25/21 11/25/22  Roxan Hockey, MD  gabapentin (NEURONTIN) 600 MG tablet Take 0.5 tablets (300 mg total) by mouth 3 (three) times daily. Patient taking differently: Take 600 mg by mouth in the morning, at noon, in the evening, and at bedtime. 05/18/21   Kathie Dike, MD  gentamicin cream (GARAMYCIN) 0.1 % Apply 1 Application topically 2 (two) times daily. 12/30/21   [provider]  leptospermum manuka honey (MEDIHONEY) PSTE paste Apply 1 Application topically daily. 01/19/22   Geradine Girt, DO  MOUNJARO 5 MG/0.5ML Pen Inject 10 mg into the skin once a week. 11/23/21   [provider]  NOVOLOG RELION 100 UNIT/ML injection Inject 4-20 Units into the skin 3 (three) times daily with meals. Per sliding scale:70-130; 0 units, 130 to 180;  4 units; 180-240; 8 units; 241-300 ; 10 units; 301-350; 12 units; 351-400; 16 units. > 400 20 units and call MD 10/31/21   [provider]  prazosin (MINIPRESS) 2 MG capsule Take 2 mg by mouth at bedtime. 03/15/21   [provider]      Allergies    Ibuprofen, Red dye, Codeine, and Nitrofurantoin    Review of Systems   Review of Systems  Physical Exam Updated Vital Signs BP (!) 113/48   Pulse 96   Temp 98.6 F (37 C) (Oral)   Resp 18   Ht 1.626 m ('5\' 4"'$ )   Wt 115.7 kg   SpO2 97%   BMI 43.77 kg/m  Physical Exam  ED Results / Procedures / Treatments   Labs (all labs ordered are listed, but only abnormal results are displayed) Labs Reviewed - No data to display  EKG None  Radiology No results found.  Procedures Procedures  {Document cardiac monitor, telemetry assessment procedure when appropriate:1}  Medications Ordered in ED Medications - No data to display  ED Course/ Medical Decision Making/ A&P                           Medical Decision Making  ***  {Document critical care time when appropriate:1} {Document review of labs and clinical decision tools ie heart score, Chads2Vasc2 etc:1}  {Document your independent review  of radiology images, and any outside records:1} {Document your discussion with family members, caretakers, and with consultants:1} {Document social determinants of health affecting pt's care:1} {Document your decision making why or why not admission, treatments were needed:1} Final Clinical Impression(s) / ED Diagnoses Final diagnoses:  None    Rx / DC Orders ED Discharge Orders     None

## 2022-02-06 NOTE — Discharge Instructions (Signed)
The pain medication as directed.  Incidental finding of left renal mass will require outpatient work-up.  Make an appointment follow back up with your primary care doctor.  Also information provided to set up an appointment with hematology oncology.  This needs to be further evaluated to make sure its not consistent with cancer.  Wound is healing well.  CT scan otherwise showed no abnormalities in the abdomen pelvis or at the back area.

## 2022-02-06 NOTE — ED Triage Notes (Signed)
Patient recently had sx for necrotizing fasciitis. Daughter, (nurse) has been cleaning area. Last night pt turned and a got a sharp pain in the back and down her leg. Today the pain was unbearable.

## 2022-02-09 ENCOUNTER — Encounter: Payer: Self-pay | Admitting: Internal Medicine

## 2022-02-09 ENCOUNTER — Ambulatory Visit (INDEPENDENT_AMBULATORY_CARE_PROVIDER_SITE_OTHER): Payer: Medicare HMO | Admitting: Internal Medicine

## 2022-02-09 ENCOUNTER — Other Ambulatory Visit: Payer: Self-pay

## 2022-02-09 VITALS — BP 109/68 | HR 85 | Temp 98.3°F

## 2022-02-09 DIAGNOSIS — A429 Actinomycosis, unspecified: Secondary | ICD-10-CM | POA: Diagnosis not present

## 2022-02-09 DIAGNOSIS — M7989 Other specified soft tissue disorders: Secondary | ICD-10-CM | POA: Diagnosis not present

## 2022-02-15 NOTE — Progress Notes (Signed)
RFV: hospital follow up for necrotizing fascitis  Patient ID: [name], female   DOB: November 14, 1960, 61 y.o.   MRN: [id number]  HPI  61 yo female recurrent boils in thighs admitted 6/23 for thigh abscess and severe sepsis with aki, found to have necrotizing fasciitis with CT findings of pelvis and femur showing deep space infection. Prior to admit she had been on bactrim x 10 days. She had repeated debridement on 6/23 and 6/24. Her OR cx showed rare staph epi and actinomyces species. The extent of the Necrotizing fasciitis of the right medial thigh and buttock, debridement area measured 20 x 15 x 4 cm (300 sq cm) The OR report -- "The infection was noted to track down the medial aspect of her right thigh.  Additional skin overlying the infection was excised to allow for complete exposure and full debridement of the area.  Curette was also used to debride infection and necrotic tissue.  Debridement was carried down to muscle with arterial bleeding.  Once all of the necrotic tissue was removed, a Pulsavac was used to thoroughly irrigate the wound.  Hemostasis was again achieved with electrocautery.  Debrided tissue was sent to pathology for evaluation.  The final wound measured 20 x 15 x 4 cm.". ID service was consulted on recommendations for treatment of infection. She was initially placed on amox/clav through 7/1 then transitioned to high dose amoxicillin at '1000mg'$  TID for which she is still taking.  She is here with her daughter, who is a Marine scientist, also mentioning that the wound bed is good, continues to change dressing daily. No cellulitis or exudate in the wound. The depth seems to be improving.   Outpatient Encounter Medications as of 02/09/2022  Medication Sig   acetaminophen (TYLENOL) 325 MG tablet Take 2 tablets (650 mg total) by mouth every 6 (six) hours as needed for moderate pain or fever.   amoxicillin (AMOXIL) 500 MG capsule Take 2 capsules (1,000 mg total) by mouth every 8 (eight)  hours.   dicyclomine (BENTYL) 20 MG tablet Take 20 mg by mouth 4 (four) times daily as needed for cramping.   DULoxetine (CYMBALTA) 60 MG capsule Take 60 mg by mouth 2 (two) times daily.    escitalopram (LEXAPRO) 10 MG tablet Take 10 mg by mouth daily.   furosemide (LASIX) 40 MG tablet Take 1 tablet (40 mg total) by mouth daily. Start 11/25/2021   gabapentin (NEURONTIN) 600 MG tablet Take 0.5 tablets (300 mg total) by mouth 3 (three) times daily. (Patient taking differently: Take 600 mg by mouth in the morning, at noon, in the evening, and at bedtime.)   leptospermum manuka honey (MEDIHONEY) PSTE paste Apply 1 Application topically daily.   MOUNJARO 5 MG/0.5ML Pen Inject 10 mg into the skin once a week.   NOVOLOG RELION 100 UNIT/ML injection Inject 4-20 Units into the skin 3 (three) times daily with meals. Per sliding scale:70-130; 0 units, 130 to 180; 4 units; 180-240; 8 units; 241-300 ; 10 units; 301-350; 12 units; 351-400; 16 units. > 400 20 units and call MD   oxyCODONE-acetaminophen (PERCOCET/ROXICET) 5-325 MG tablet Take 1 tablet by mouth every 6 (six) hours as needed for severe pain.   prazosin (MINIPRESS) 2 MG capsule Take 2 mg by mouth at bedtime.   gentamicin cream (GARAMYCIN) 0.1 % Apply 1 Application topically 2 (two) times daily. (Patient not taking: Reported on 02/09/2022)   No facility-administered encounter medications on file as of 02/09/2022.     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   Stage 3a chronic kidney disease (CKD) ([place]) 12/22/2021   Neuropathy 12/22/2021   Lesion of left native kidney 12/22/2021   Dehydration 11/23/2021   Obesity,  Class III, BMI 40-49.9 (morbid obesity) ([place]) 11/23/2021   AKI (acute kidney injury) ([place]) 05/16/2021   Serum creatinine raised 08/24/2017   Atypical chest pain 07/12/2017   Chest pain 07/11/2017   Unstable angina ([place]) 07/11/2017   DIZZINESS 04/25/2010   CHEST PAIN, ATYPICAL 04/25/2010   ABDOMINAL PAIN 12/03/2009   PALPITATIONS 11/15/2009   Obesity (BMI 30-39.9) 10/07/2009   TROCHANTERIC BURSITIS, LEFT 10/07/2009   Type 2 diabetes mellitus ([place]) 09/30/2009   MDD (major depressive disorder) 09/30/2009   Migraine headache 09/30/2009   Essential hypertension 09/30/2009   GERD 04/15/2009   DIARRHEA, CHRONIC 04/15/2009     Health Maintenance Due  Topic Date Due   COVID-19 Vaccine (1) Never done   FOOT EXAM  Never done   OPHTHALMOLOGY EXAM  Never done   Hepatitis C Screening  Never done   Zoster Vaccines- Shingrix (1 of 2) Never done   PAP SMEAR-Modifier  Never done   COLONOSCOPY (Pts 45-3yr Insurance coverage will need to be confirmed)  Never done   MAMMOGRAM  Never done     Review of Systems No fever, chills, diarrhea, nightsweats. +surgical wound. 12 point ros is otherwise negative Physical Exam   BP 109/68   Pulse 85   Temp 98.3 F (36.8 C) (Oral)    Gen= a xo by 3 in nad HEENT = MMM, OP clear, no exudate Pulm = CTAB no w/c/r Heart = nl s1,s2, no g/m/r Skin = dressing taken down, serous fluid soaked bandages no foul smell wound bed is clean no exudate (not measured)  CBC Lab Results  Component Value Date   WBC 6.0 02/06/2022   RBC 3.40 (L) 02/06/2022   HGB 10.0 (L) 02/06/2022   HCT 31.9 (L) 02/06/2022   PLT 171 02/06/2022   MCV 93.8 02/06/2022   MCH 29.4 02/06/2022   MCHC 31.3 02/06/2022   RDW 15.0 02/06/2022   LYMPHSABS 1.6 02/06/2022   MONOABS 0.7 02/06/2022   EOSABS 0.2 02/06/2022    BMET Lab Results  Component Value Date   NA 139 02/06/2022   K 3.9 02/06/2022   CL 108 02/06/2022   CO2 24 02/06/2022   GLUCOSE 136 (H) 02/06/2022   BUN 37 (H)  02/06/2022   CREATININE 1.33 (H) 02/06/2022   CALCIUM 8.2 (L) 02/06/2022   GFRNONAA 46 (L) 02/06/2022   GFRAA 58 (L) 08/08/2017    Assessment and Plan  Necrotizing fascitis = continue on amox 1gm TID for Actinomyces, infection often treat for 3-660monthepending on healing wound and extent of infection. For the time being, plan for 3 months since debridement which would end date would be end of September.  Continue with wound care follow up as well.

## 2022-02-17 ENCOUNTER — Encounter (HOSPITAL_BASED_OUTPATIENT_CLINIC_OR_DEPARTMENT_OTHER): Payer: Medicare HMO | Attending: Internal Medicine | Admitting: Internal Medicine

## 2022-02-17 DIAGNOSIS — X58XXXA Exposure to other specified factors, initial encounter: Secondary | ICD-10-CM | POA: Diagnosis not present

## 2022-02-17 DIAGNOSIS — E11622 Type 2 diabetes mellitus with other skin ulcer: Secondary | ICD-10-CM | POA: Diagnosis not present

## 2022-02-17 DIAGNOSIS — M726 Necrotizing fasciitis: Secondary | ICD-10-CM | POA: Diagnosis not present

## 2022-02-17 DIAGNOSIS — L98419 Non-pressure chronic ulcer of buttock with unspecified severity: Secondary | ICD-10-CM | POA: Insufficient documentation

## 2022-02-17 DIAGNOSIS — S31819A Unspecified open wound of right buttock, initial encounter: Secondary | ICD-10-CM | POA: Insufficient documentation

## 2022-02-17 DIAGNOSIS — S31502A Unspecified open wound of unspecified external genital organs, female, initial encounter: Secondary | ICD-10-CM | POA: Insufficient documentation
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
## 2022-02-17 NOTE — Progress Notes (Signed)
[name], [name] ([id number]) Visit Report for 02/17/2022 Abuse Risk Screen Details Patient Name: Date of Service: [name] 02/17/2022 9:45 A M Medical Record Number: [id number] Patient Account Number: [id number] Date of Birth/Sex: Treating RN: 1960/08/06 (61 y.o. Debby Bud Primary Care Devyon Keator: Teressa Senter Other Clinician: Referring Jaquille Kau: Treating Dangela How/Extender: Deitra Mayo, Lauren Weeks in Treatment: 0 Abuse Risk Screen Items Answer ABUSE RISK SCREEN: Has anyone close to you tried to hurt or harm you recentlyo No Do you feel uncomfortable with anyone in your familyo No Has anyone forced you do things that you didnt want to doo No Electronic Signature(s) Signed: 02/17/2022 5:18:09 PM By: Deon Pilling RN, BSN Entered By: Deon Pilling on 02/17/2022 10:22:32 -------------------------------------------------------------------------------- Activities of Daily Living Details Patient Name: Date of Service: [name] 02/17/2022 9:45 A M Medical Record Number: [id number] Patient Account Number: [id number] Date of Birth/Sex: Treating RN: [birth year] (61 y.o. Debby Bud Primary Care Akiel Fennell: Teressa Senter Other Clinician: Referring Chrisette Man: Treating Shweta Aman/Extender: Deitra Mayo, Lauren Weeks in Treatment: 0 Activities of Daily Living Items Answer Activities of Daily Living (Please select one for each item) Drive Automobile Not Able T Medications ake Completely Able Use T elephone Completely Able Care for Appearance Completely Able Use T oilet Completely Able Bath / Shower Completely Able Dress Self Completely Able Feed Self Completely Able Walk Need Assistance Get In / Out Bed Completely Potter for Self Need Assistance Notes per patient suppose to stand and pivot only due to feet. Electronic Signature(s) Signed:  02/17/2022 5:18:09 PM By: Deon Pilling RN, BSN Signed: 02/17/2022 5:18:09 PM By: Deon Pilling RN, BSN Entered By: Deon Pilling on 02/17/2022 10:24:19 -------------------------------------------------------------------------------- Education Screening Details Patient Name: Date of Service: [name], [name] RA H G. 02/17/2022 9:45 A M Medical Record Number: [id number] Patient Account Number: [id number] Date of Birth/Sex: Treating RN: [birth year] (61 y.o. Debby Bud Primary Care Ahmir Bracken: Teressa Senter Other Clinician: Referring Wandell Scullion: Treating Cystal Shannahan/Extender: Clare Charon in Treatment: 0 Primary Learner Assessed: Patient Learning Preferences/Education Level/Primary Language Learning Preference: Explanation, Demonstration, Printed Material Highest Education Level: High School Preferred Language: English Cognitive Barrier Language Barrier: No Translator Needed: No Memory Deficit: No Emotional Barrier: No Cultural/Religious Beliefs Affecting Medical Care: No Physical Barrier Impaired Vision: Yes Glasses Impaired Hearing: No Decreased Hand dexterity: No Knowledge/Comprehension Knowledge Level: High Comprehension Level: High Ability to understand written instructions: High Ability to understand verbal instructions: High Motivation Anxiety Level: Calm Cooperation: Cooperative Education Importance: Acknowledges Need Interest in Health Problems: Asks Questions Perception: Coherent Willingness to Engage in Self-Management High Activities: Readiness to Engage in Self-Management High Activities: Electronic Signature(s) Signed: 02/17/2022 5:18:09 PM By: Deon Pilling RN, BSN Entered By: Deon Pilling on 02/17/2022 10:24:42 -------------------------------------------------------------------------------- Fall Risk Assessment Details Patient Name: Date of Service: [name], [name] RA H G. 02/17/2022 9:45 A M Medical Record Number:  [id number] Patient Account Number: [id number] Date of Birth/Sex: Treating RN: 02/01/1961 (60 y.o. [name], [name] Primary Care Shaden Lacher: Teressa Senter Other Clinician: Referring Tiago Humphrey: Treating Celestino Ackerman/Extender: Clare Charon in Treatment: 0 Fall Risk Assessment Items Have you had 2 or more falls in the last 12 monthso 0 No Have you had any fall that resulted in injury in the last 12 monthso 0 No FALLS RISK SCREEN History of falling - immediate or within 3 months 25 Yes Secondary diagnosis (Do you have 2 or  more medical diagnoseso) 0 No Ambulatory aid None/bed rest/wheelchair/nurse 0 Yes Crutches/cane/walker 0 No Furniture 0 No Intravenous therapy Access/Saline/Heparin Lock 0 No Gait/Transferring Normal/ bed rest/ wheelchair 0 No Weak (short steps with or without shuffle, stooped but able to lift Boyd while walking, may seek 0 No support from furniture) Impaired (short steps with shuffle, may have difficulty arising from chair, Boyd down, impaired 20 Yes balance) Mental Status Oriented to own ability 0 Yes Electronic Signature(s) Signed: 02/17/2022 5:18:09 PM By: Deon Pilling RN, BSN Entered By: Deon Pilling on 02/17/2022 10:25:05 -------------------------------------------------------------------------------- Foot Assessment Details Patient Name: Date of Service: [name], [name] RA H G. 02/17/2022 9:45 A M Medical Record Number: [id number] Patient Account Number: [id number] Date of Birth/Sex: Treating RN: 06-04-1961 (61 y.o. Debby Bud Primary Care Wei Newbrough: Teressa Senter Other Clinician: Referring Elam Ellis: Treating Jatorian Renault/Extender: Deitra Mayo, Lauren Weeks in Treatment: 0 Foot Assessment Items Site Locations + = Sensation present, - = Sensation absent, C = Callus, U = Ulcer R = Redness, W = Warmth, M = Maceration, PU = Pre-ulcerative lesion F = Fissure, S = Swelling, D = Dryness Assessment Right:  Left: Other Deformity: No No Prior Foot Ulcer: No No Prior Amputation: No No Charcot Joint: No No Ambulatory Status: Ambulatory With Help Assistance Device: Wheelchair Gait: Unsteady Notes NO BLE wounds. Electronic Signature(s) Signed: 02/17/2022 5:18:09 PM By: Deon Pilling RN, BSN Entered By: Deon Pilling on 02/17/2022 10:50:11 -------------------------------------------------------------------------------- Nutrition Risk Screening Details Patient Name: Date of Service: [name] 02/17/2022 9:45 A M Medical Record Number: [id number] Patient Account Number: [id number] Date of Birth/Sex: Treating RN: [birth year] (61 y.o. [name], [name] Primary Care Adja Ruff: Teressa Senter Other Clinician: Referring Laniesha Das: Treating Kathey Simer/Extender: Deitra Mayo, Lauren Weeks in Treatment: 0 Height (in): 64 Weight (lbs): 248 Body Mass Index (BMI): 42.6 Nutrition Risk Screening Items Score Screening NUTRITION RISK SCREEN: I have an illness or condition that made me change the kind and/or amount of food I eat 2 Yes I eat fewer than two meals per day 0 No I eat few fruits and vegetables, or milk products 0 No I have three or more drinks of beer, liquor or wine almost every day 0 No I have tooth or mouth problems that make it hard for me to eat 0 No I don't always have enough money to buy the food I need 0 No I eat alone most of the time 0 No I take three or more different prescribed or over-the-counter drugs a day 1 Yes Without wanting to, I have lost or gained 10 pounds in the last six months 0 No I am not always physically able to shop, cook and/or feed myself 0 No Nutrition Protocols Good Risk Protocol Provide education on elevated blood Moderate Risk Protocol 0 sugars and impact on wound healing, as applicable High Risk Proctocol Risk Level: Moderate Risk Score: 3 Electronic Signature(s) Signed: 02/17/2022 5:18:09 PM By: Deon Pilling RN,  BSN Entered By: Deon Pilling on 02/17/2022 10:25:13

## 2022-02-17 NOTE — Progress Notes (Signed)
JERMIA, RIGSBY ([id number]) Visit Report for 02/17/2022 Allergy List Details Patient Name: Date of Service: [name] 02/17/2022 9:45 A M Medical Record Number: [id number] Patient Account Number: [id number] Date of Birth/Sex: Treating RN: August 13, 1960 (61 y.o. Helene Shoe, Tammi Klippel Primary Care Kelijah Towry: Teressa Senter Other Clinician: Referring Leolia Vinzant: Treating Mahlik Lenn/Extender: Deitra Mayo, Lauren Weeks in Treatment: 0 Allergies Active Allergies ibuprofen Reaction: swelling codeine Reaction: palpitations nitrofurantoin Reaction: diarrhea, n/v red dye Reaction: syncope Allergy Notes Electronic Signature(s) Signed: 02/17/2022 5:18:09 PM By: Deon Pilling RN, BSN Entered By: Deon Pilling on 02/17/2022 08:14:03 -------------------------------------------------------------------------------- Arrival Information Details Patient Name: Date of Service: [name], [name] RA H G. 02/17/2022 9:45 A M Medical Record Number: [id number] Patient Account Number: [id number] Date of Birth/Sex: Treating RN: [birth year] (61 y.o. Debby Bud Primary Care Denette Hass: Teressa Senter Other Clinician: Referring Mette Southgate: Treating Layce Sprung/Extender: Clare Charon in Treatment: 0 Visit Information Patient Arrived: Wheel Chair Arrival Time: 10:17 Accompanied By: daughter Transfer Assistance: None Patient Identification Verified: Yes Secondary Verification Process Completed: Yes Patient Requires Transmission-Based Precautions: No Patient Has Alerts: No Electronic Signature(s) Signed: 02/17/2022 5:18:09 PM By: Deon Pilling RN, BSN Entered By: Deon Pilling on 02/17/2022 10:20:41 -------------------------------------------------------------------------------- Clinic Level of Care Assessment Details Patient Name: Date of Service: [name] 02/17/2022 9:45 A M Medical Record Number: [id number] Patient Account Number:  [id number] Date of Birth/Sex: Treating RN: 1961-07-02 (61 y.o. Debby Bud Primary Care Dhaval Woo: Teressa Senter Other Clinician: Referring Mayank Teuscher: Treating Waunetta Riggle/Extender: Deitra Mayo, Lauren Weeks in Treatment: 0 Clinic Level of Care Assessment Items TOOL 2 Quantity Score X- 1 0 Use when only an EandM is performed on the INITIAL visit ASSESSMENTS - Nursing Assessment / Reassessment X- 1 20 General Physical Exam (combine w/ comprehensive assessment (listed just below) when performed on new pt. evals) X- 1 25 Comprehensive Assessment (HX, ROS, Risk Assessments, Wounds Hx, etc.) ASSESSMENTS - Wound and Skin A ssessment / Reassessment _0  - 0 Simple Wound Assessment / Reassessment - one wound X- 1 5 Complex Wound Assessment / Reassessment - multiple wounds X- 1 10 Dermatologic / Skin Assessment (not related to wound area) ASSESSMENTS - Ostomy and/or Continence Assessment and Care _1  - 0 Incontinence Assessment and Management _2  - 0 Ostomy Care Assessment and Management (repouching, etc.) PROCESS - Coordination of Care _3  - 0 Simple Patient / Family Education for ongoing care X- 1 20 Complex (extensive) Patient / Family Education for ongoing care X- 1 10 Staff obtains Programmer, systems, Records, T Results / Process Orders est X- 1 10 Staff telephones HHA, Nursing Homes / Clarify orders / etc _4  - 0 Routine Transfer to another Facility (non-emergent condition) _5  - 0 Routine Hospital Admission (non-emergent condition) _6  - 0 New Admissions / Biomedical engineer / Ordering NPWT Apligraf, etc. , _7  - 0 Emergency Hospital Admission (emergent condition) _8  - 0 Simple Discharge Coordination X- 1 15 Complex (extensive) Discharge Coordination PROCESS - Special Needs _9  - 0 Pediatric / Minor Patient Management _10  - 0 Isolation Patient Management _11  - 0 Hearing / Language / Visual special needs _12  - 0 Assessment of Community assistance (transportation,  D/C planning, etc.) _13  - 0 Additional assistance / Altered mentation _14  - 0 Support Surface(s) Assessment (bed, cushion, seat, etc.) INTERVENTIONS - Wound Cleansing / Measurement X- 1 5 Wound Imaging (photographs - any number of wounds) _15  - 0 Wound Tracing (instead of photographs) _16  - 0 Simple Wound Measurement - one wound X- 1  5 Complex Wound Measurement - multiple wounds _0  - 0 Simple Wound Cleansing - one wound X- 1 5 Complex Wound Cleansing - multiple wounds INTERVENTIONS - Wound Dressings _1  - 0 Small Wound Dressing one or multiple wounds X- 1 15 Medium Wound Dressing one or multiple wounds _2  - 0 Large Wound Dressing one or multiple wounds <[id number]>_0<\/[id number]>_0  - 0 Application of Medications - injection INTERVENTIONS - Miscellaneous _4  - 0 External ear exam _5  - 0 Specimen Collection (cultures, biopsies, blood, body fluids, etc.) _6  - 0 Specimen(s) / Culture(s) sent or taken to Lab for analysis _7  - 0 Patient Transfer (multiple staff / Harrel Lemon Lift / Similar devices) _8  - 0 Simple Staple / Suture removal (25 or less) _9  - 0 Complex Staple / Suture removal (26 or more) _10  - 0 Hypo / Hyperglycemic Management (close monitor of Blood Glucose) _11  - 0 Ankle / Brachial Index (ABI) - do not check if billed separately Has the patient been seen at the hospital within the last three years: Yes Total Score: 145 Level Of Care: New/Established - Level 4 Electronic Signature(s) Signed: 02/17/2022 5:18:09 PM By: Deon Pilling RN, BSN Entered By: Deon Pilling on 02/17/2022 12:48:01 -------------------------------------------------------------------------------- Encounter Discharge Information Details Patient Name: Date of Service: [name], [name] RA H G. 02/17/2022 9:45 A M Medical Record Number: [id number] Patient Account Number: [id number] Date of Birth/Sex: Treating RN: 1960-10-28 (61 y.o. Debby Bud Primary Care Lashawn Orrego: Teressa Senter Other Clinician: Referring  Alva Broxson: Treating Laney Louderback/Extender: Clare Charon in Treatment: 0 Encounter Discharge Information Items Discharge Condition: Stable Ambulatory Status: Ambulatory Discharge Destination: Home Transportation: Private Auto Accompanied By: daughter Schedule Follow-up Appointment: Yes Clinical Summary of Care: Electronic Signature(s) Signed: 02/17/2022 5:18:09 PM By: Deon Pilling RN, BSN Entered By: Deon Pilling on 02/17/2022 12:48:26 -------------------------------------------------------------------------------- Lower Extremity Assessment Details Patient Name: Date of Service: [name], [name] Dess RA H G. 02/17/2022 9:45 A M Medical Record Number: [id number] Patient Account Number: [id number] Date of Birth/Sex: Treating RN: 1960/08/07 (61 y.o. Debby Bud Primary Care Joceline Hinchcliff: Teressa Senter Other Clinician: Referring Keigan Tafoya: Treating Navdeep Fessenden/Extender: Clare Charon in Treatment: 0 Electronic Signature(s) Signed: 02/17/2022 5:18:09 PM By: Deon Pilling RN, BSN Entered By: Deon Pilling on 02/17/2022 10:50:14 -------------------------------------------------------------------------------- Multi Wound Chart Details Patient Name: Date of Service: [name], [name] RA H G. 02/17/2022 9:45 A M Medical Record Number: [id number] Patient Account Number: [id number] Date of Birth/Sex: Treating RN: 08-30-1960 (61 y.o. Helene Shoe, Meta.Reding Primary Care Makyi Ledo: Teressa Senter Other Clinician: Referring Artice Bergerson: Treating Dreshon Proffit/Extender: Deitra Mayo, Lauren Weeks in Treatment: 0 Vital Signs Height(in): 64 Capillary Blood Glucose(mg/dl): 160 Weight(lbs): 248 Pulse(bpm): 80 Body Mass Index(BMI): 42.6 Blood Pressure(mmHg): 113/69 Temperature(F): 98.1 Respiratory Rate(breaths/min): 20 Photos: [N/A:N/A] Right Groin N/A N/A Wound Location: Surgical Injury N/A N/A Wounding Event: Open Surgical Wound N/A  N/A Primary Etiology: Hypertension, Type II Diabetes, N/A N/A Comorbid History: Neuropathy 01/13/2022 N/A N/A Date Acquired: 0 N/A N/A Weeks of Treatment: Open N/A N/A Wound Status: No N/A N/A Wound Recurrence: 9.5x7x2.7 N/A N/A Measurements L x W x D (cm) 52.229 N/A N/A A (cm) : rea 141.018 N/A N/A Volume (cm) : 12 Starting Position 1 (o'clock): 1 Ending Position 1 (o'clock): 3.3 Maximum Distance 1 (cm): 3 Starting Position 2 (o'clock): 5 Ending Position 2 (o'clock): 2.4 Maximum Distance 2 (cm): Yes N/A N/A Undermining: Full Thickness Without Exposed N/A N/A Classification: Support Structures Medium N/A N/A Exudate Amount: Serosanguineous N/A N/A Exudate Type: red, brown N/A N/A Exudate Color:  Distinct, outline attached N/A N/A Wound Margin: Large (67-100%) N/A N/A Granulation Amount: Red, Pink N/A N/A Granulation Quality: Small (1-33%) N/A N/A Necrotic Amount: Fat Layer (Subcutaneous Tissue): Yes N/A N/A Exposed Structures: Fascia: No Tendon: No Muscle: No Joint: No Bone: No Treatment Notes Electronic Signature(s) Signed: 02/17/2022 11:50:19 AM By: Kalman Shan DO Signed: 02/17/2022 5:18:09 PM By: Deon Pilling RN, BSN Entered By: Kalman Shan on 02/17/2022 11:38:05 -------------------------------------------------------------------------------- Multi-Disciplinary Care Plan Details Patient Name: Date of Service: [name], [name] RA H G. 02/17/2022 9:45 A M Medical Record Number: [id number] Patient Account Number: [id number] Date of Birth/Sex: Treating RN: 1961-07-06 (61 y.o. Helene Shoe, Tammi Klippel Primary Care Judeen Geralds: Teressa Senter Other Clinician: Referring Kathey Simer: Treating Aliegha Paullin/Extender: Clare Charon in Treatment: 0 Active Inactive Orientation to the Wound Care Program Nursing Diagnoses: Knowledge deficit related to the wound healing center program Goals: Patient/caregiver will verbalize  understanding of the [facility] Program Date Initiated: 02/17/2022 Target Resolution Date: 02/24/2022 Goal Status: Active Interventions: Provide education on orientation to the wound center Notes: Pain, Acute or Chronic Nursing Diagnoses: Pain, acute or chronic: actual or potential Potential alteration in comfort, pain Goals: Patient will verbalize adequate pain control and receive pain control interventions during procedures as needed Date Initiated: 02/17/2022 Target Resolution Date: 02/23/2022 Goal Status: Active Patient/caregiver will verbalize comfort level met Date Initiated: 02/17/2022 Target Resolution Date: 02/23/2022 Goal Status: Active Interventions: Encourage patient to take pain medications as prescribed Provide education on pain management Treatment Activities: Administer pain control measures as ordered : 02/17/2022 Notes: Wound/Skin Impairment Nursing Diagnoses: Knowledge deficit related to ulceration/compromised skin integrity Goals: Patient/caregiver will verbalize understanding of skin care regimen Date Initiated: 02/17/2022 Target Resolution Date: 02/24/2022 Goal Status: Active Interventions: Assess patient/caregiver ability to perform ulcer/skin care regimen upon admission and as needed Assess ulceration(s) every visit Provide education on ulcer and skin care Treatment Activities: Skin care regimen initiated : 02/17/2022 Topical wound management initiated : 02/17/2022 Notes: Electronic Signature(s) Signed: 02/17/2022 5:18:09 PM By: Deon Pilling RN, BSN Entered By: Deon Pilling on 02/17/2022 12:47:02 -------------------------------------------------------------------------------- Pain Assessment Details Patient Name: Date of Service: [name], [name] Dess RA H G. 02/17/2022 9:45 A M Medical Record Number: [id number] Patient Account Number: [id number] Date of Birth/Sex: Treating RN: 07/05/1961 (61 y.o. Debby Bud Primary Care Zamorah Ailes: Teressa Senter Other Clinician: Referring Andrick Rust: Treating Lala Been/Extender: Deitra Mayo, Lauren Weeks in Treatment: 0 Active Problems Location of Pain Severity and Description of Pain Patient Has Paino Yes Site Locations Rate the pain. Current Pain Level: 2 Pain Management and Medication Current Pain Management: Medication: No Cold Application: No Rest: No Massage: No Activity: No T.E.N.S.: No Heat Application: No Leg drop or elevation: No Is the Current Pain Management Adequate: Adequate How does your wound impact your activities of daily livingo Sleep: No Bathing: No Appetite: No Relationship With Others: No Bladder Continence: No Emotions: No Bowel Continence: No Work: No Toileting: No Drive: No Dressing: No Hobbies: No Engineer, maintenance) Signed: 02/17/2022 5:18:09 PM By: Deon Pilling RN, BSN Entered By: Deon Pilling on 02/17/2022 10:25:24 -------------------------------------------------------------------------------- Patient/Caregiver Education Details Patient Name: Date of Service: [name] 7/28/2023andnbsp9:45 A M Medical Record Number: [id number] Patient Account Number: [id number] Date of Birth/Gender: Treating RN: 1961-06-05 (61 y.o. Debby Bud Primary Care Physician: Teressa Senter Other Clinician: Referring Physician: Treating Physician/Extender: Clare Charon in Treatment: 0 Education Assessment Education Provided To: Patient Education Topics Provided Welcome T [facility]: o Handouts: Welcome  T [facility] o Methods: Explain/Verbal Responses: Reinforcements needed Electronic Signature(s) Signed: 02/17/2022 5:18:09 PM By: Deon Pilling RN, BSN Entered By: Deon Pilling on 02/17/2022 12:47:11 -------------------------------------------------------------------------------- Wound Assessment Details Patient Name: Date of Service: [name], [name] RA H G.  02/17/2022 9:45 A M Medical Record Number: [id number] Patient Account Number: [id number] Date of Birth/Sex: Treating RN: May 21, 1961 (61 y.o. Helene Shoe, Meta.Reding Primary Care Joziyah Roblero: Teressa Senter Other Clinician: Referring Amanie Mcculley: Treating Kennie Snedden/Extender: Deitra Mayo, Lauren Weeks in Treatment: 0 Wound Status Wound Number: 1 Primary Etiology: Open Surgical Wound Wound Location: Right Groin Wound Status: Open Wounding Event: Surgical Injury Comorbid History: Hypertension, Type II Diabetes, Neuropathy Date Acquired: 01/13/2022 Weeks Of Treatment: 0 Clustered Wound: No Photos Wound Measurements Length: (cm) 9.5 Width: (cm) 7 Depth: (cm) 2.7 Area: (cm) 52.229 Volume: (cm) 141.018 % Reduction in Area: % Reduction in Volume: Tunneling: No Undermining: Yes Location 1 Starting Position (o'clock): 12 Ending Position (o'clock): 1 Maximum Distance: (cm) 3.3 Location 2 Starting Position (o'clock): 3 Ending Position (o'clock): 5 Maximum Distance: (cm) 2.4 Wound Description Classification: Full Thickness Without Exposed Support Structures Wound Margin: Distinct, outline attached Exudate Amount: Medium Exudate Type: Serosanguineous Exudate Color: red, brown Foul Odor After Cleansing: No Slough/Fibrino Yes Wound Bed Granulation Amount: Large (67-100%) Exposed Structure Granulation Quality: Red, Pink Fascia Exposed: No Necrotic Amount: Small (1-33%) Fat Layer (Subcutaneous Tissue) Exposed: Yes Necrotic Quality: Adherent Slough Tendon Exposed: No Muscle Exposed: No Joint Exposed: No Bone Exposed: No Treatment Notes Wound #1 (Groin) Wound Laterality: Right Cleanser Wound Cleanser Discharge Instruction: Cleanse the wound with wound cleanser prior to applying a clean dressing using gauze sponges, not tissue or cotton balls. Peri-Wound Care Skin Prep Discharge Instruction: Use skin prep as directed Topical Primary Dressing Dakin's Solution 0.25%, 16  (oz) Discharge Instruction: Moisten gauze with Dakin's solution 2" conform Discharge Instruction: moisten the 2' conform with Dakin's lightly pack into wound bed and undermining. Secondary Dressing Zetuvit Plus 4x8 in Discharge Instruction: Apply over primary dressing as directed. Secured With 30M Medipore H Soft Cloth Surgical T ape, 4 x 10 (in/yd) Discharge Instruction: Secure with tape as directed. Compression Wrap Compression Stockings Add-Ons Electronic Signature(s) Signed: 02/17/2022 5:18:09 PM By: Deon Pilling RN, BSN Entered By: Deon Pilling on 02/17/2022 10:41:35 -------------------------------------------------------------------------------- Vitals Details Patient Name: Date of Service: [name], [name] RA H G. 02/17/2022 9:45 A M Medical Record Number: [id number] Patient Account Number: [id number] Date of Birth/Sex: Treating RN: 11/23/1960 (61 y.o. Helene Shoe, Meta.Reding Primary Care Arcadia Gorgas: Teressa Senter Other Clinician: Referring Tenecia Ignasiak: Treating Zarina Pe/Extender: Deitra Mayo, Lauren Weeks in Treatment: 0 Vital Signs Time Taken: 10:20 Temperature (F): 98.1 Height (in): 64 Pulse (bpm): 80 Source: Stated Respiratory Rate (breaths/min): 20 Weight (lbs): 248 Blood Pressure (mmHg): 113/69 Source: Stated Capillary Blood Glucose (mg/dl): 160 Body Mass Index (BMI): 42.6 Reference Range: 80 - 120 mg / dl Electronic Signature(s) Signed: 02/17/2022 5:18:09 PM By: Deon Pilling RN, BSN Entered By: Deon Pilling on 02/17/2022 68:11:57

## 2022-03-02 ENCOUNTER — Encounter (HOSPITAL_BASED_OUTPATIENT_CLINIC_OR_DEPARTMENT_OTHER): Payer: Medicare HMO | Attending: Internal Medicine | Admitting: Internal Medicine

## 2022-03-02 DIAGNOSIS — Z794 Long term (current) use of insulin: Secondary | ICD-10-CM | POA: Diagnosis not present

## 2022-03-02 DIAGNOSIS — S31819A Unspecified open wound of right buttock, initial encounter: Secondary | ICD-10-CM | POA: Diagnosis not present

## 2022-03-02 DIAGNOSIS — S31502A Unspecified open wound of unspecified external genital organs, female, initial encounter: Secondary | ICD-10-CM | POA: Diagnosis not present

## 2022-03-02 DIAGNOSIS — E11622 Type 2 diabetes mellitus with other skin ulcer: Secondary | ICD-10-CM

## 2022-03-02 DIAGNOSIS — M726 Necrotizing fasciitis: Secondary | ICD-10-CM | POA: Diagnosis not present

## 2022-03-02 DIAGNOSIS — I129 Hypertensive chronic kidney disease with stage 1 through stage 4 chronic kidney disease, or unspecified chronic kidney disease: Secondary | ICD-10-CM | POA: Diagnosis not present

## 2022-03-02 DIAGNOSIS — X58XXXA Exposure to other specified factors, initial encounter: Secondary | ICD-10-CM | POA: Insufficient documentation

## 2022-03-02 DIAGNOSIS — N183 Chronic kidney disease, stage 3 unspecified: Secondary | ICD-10-CM | POA: Insufficient documentation

## 2022-03-02 DIAGNOSIS — E1122 Type 2 diabetes mellitus with diabetic chronic kidney disease: Secondary | ICD-10-CM | POA: Diagnosis not present

## 2022-03-03 NOTE — Progress Notes (Signed)
[name], Boyd ([id number]) Visit Report for 03/02/2022 Arrival Information Details Patient Name: Date of Service: [name] 03/02/2022 2:30 PM Medical Record Number: [id number] Patient Account Number: [id number] Date of Birth/Sex: Treating RN: Jan 02, 1961 (61 y.o. [name] Primary Care Cannon Arreola: Teressa Senter Other Clinician: Referring Marly Schuld: Treating Cato Liburd/Extender: Clare Charon in Treatment: 1 Visit Information History Since Last Visit Added or deleted any medications: No Patient Arrived: Wheel Chair Any new allergies or adverse reactions: No Arrival Time: 14:53 Had a fall or experienced change in No Accompanied By: Daughter activities of daily living that may affect Transfer Assistance: None risk of falls: Patient Identification Verified: Yes Signs or symptoms of abuse/neglect since last visito No Secondary Verification Process Completed: Yes Hospitalized since last visit: No Patient Requires Transmission-Based Precautions: No Implantable device outside of the clinic excluding No Patient Has Alerts: No cellular tissue based products placed in the center since last visit: Has Dressing in Place as Prescribed: Yes Pain Present Now: No Electronic Signature(s) Signed: 03/02/2022 4:44:02 PM By: Erenest Blank Entered By: Erenest Blank on 03/02/2022 14:54:09 -------------------------------------------------------------------------------- Clinic Level of Care Assessment Details Patient Name: Date of Service: [name] 03/02/2022 2:30 PM Medical Record Number: [id number] Patient Account Number: [id number] Date of Birth/Sex: Treating RN: 04/20/1961 (61 y.o. [name] Primary Care Amritpal Shropshire: Teressa Senter Other Clinician: Referring Fielding Mault: Treating Tachina Spoonemore/Extender: Clare Charon in Treatment: 1 Clinic Level of Care Assessment Items TOOL 4 Quantity Score X- 1  0 Use when only an EandM is performed on FOLLOW-UP visit ASSESSMENTS - Nursing Assessment / Reassessment X- 1 10 Reassessment of Co-morbidities (includes updates in patient status) X- 1 5 Reassessment of Adherence to Treatment Plan ASSESSMENTS - Wound and Skin A ssessment / Reassessment X - Simple Wound Assessment / Reassessment - one wound 1 5 _0  - 0 Complex Wound Assessment / Reassessment - multiple wounds _1  - 0 Dermatologic / Skin Assessment (not related to wound area) ASSESSMENTS - Focused Assessment _2  - 0 Circumferential Edema Measurements - multi extremities _3  - 0 Nutritional Assessment / Counseling / Intervention _4  - 0 Lower Extremity Assessment (monofilament, tuning fork, pulses) _5  - 0 Peripheral Arterial Disease Assessment (using hand held doppler) ASSESSMENTS - Ostomy and/or Continence Assessment and Care _6  - 0 Incontinence Assessment and Management _7  - 0 Ostomy Care Assessment and Management (repouching, etc.) PROCESS - Coordination of Care X - Simple Patient / Family Education for ongoing care 1 15 _8  - 0 Complex (extensive) Patient / Family Education for ongoing care X- 1 10 Staff obtains Programmer, systems, Records, T Results / Process Orders est X- 1 10 Staff telephones HHA, Nursing Homes / Clarify orders / etc _9  - 0 Routine Transfer to another Facility (non-emergent condition) _10  - 0 Routine Hospital Admission (non-emergent condition) _11  - 0 New Admissions / Biomedical engineer / Ordering NPWT Apligraf, etc. , _12  - 0 Emergency Hospital Admission (emergent condition) X- 1 10 Simple Discharge Coordination _13  - 0 Complex (extensive) Discharge Coordination PROCESS - Special Needs _14  - 0 Pediatric / Minor Patient Management _15  - 0 Isolation Patient Management _16  - 0 Hearing / Language / Visual special needs _17  - 0 Assessment of Community assistance (transportation, D/C planning, etc.) _18  - 0 Additional assistance / Altered mentation _19  -  0 Support Surface(s) Assessment (bed, cushion, seat, etc.) INTERVENTIONS - Wound Cleansing / Measurement X - Simple Wound Cleansing - one wound 1 5 _20  - 0 Complex Wound Cleansing -  multiple wounds X- 1 5 Wound Imaging (photographs - any number of wounds) _0  - 0 Wound Tracing (instead of photographs) X- 1 5 Simple Wound Measurement - one wound _1  - 0 Complex Wound Measurement - multiple wounds INTERVENTIONS - Wound Dressings X - Small Wound Dressing one or multiple wounds 1 10 _2  - 0 Medium Wound Dressing one or multiple wounds _3  - 0 Large Wound Dressing one or multiple wounds X- 1 5 Application of Medications - topical <[id number]>_1<\/[id number]>_4  - 0 Application of Medications - injection INTERVENTIONS - Miscellaneous _5  - 0 External ear exam _6  - 0 Specimen Collection (cultures, biopsies, blood, body fluids, etc.) _7  - 0 Specimen(s) / Culture(s) sent or taken to Lab for analysis _8  - 0 Patient Transfer (multiple staff / Civil Service fast streamer / Similar devices) _9  - 0 Simple Staple / Suture removal (25 or less) _10  - 0 Complex Staple / Suture removal (26 or more) _11  - 0 Hypo / Hyperglycemic Management (close monitor of Blood Glucose) _12  - 0 Ankle / Brachial Index (ABI) - do not check if billed separately X- 1 5 Vital Signs Has the patient been seen at the hospital within the last three years: Yes Total Score: 100 Level Of Care: New/Established - Level 3 Electronic Signature(s) Signed: 03/02/2022 5:01:19 PM By: Rhae Hammock RN Entered By: Rhae Hammock on 03/02/2022 15:16:46 -------------------------------------------------------------------------------- Encounter Discharge Information Details Patient Name: Date of Service: [name], DEBO RA H G. 03/02/2022 2:30 PM Medical Record Number: [id number] Patient Account Number: [id number] Date of Birth/Sex: Treating RN: 06-11-1961 (61 y.o. [name] Primary Care Mateja Dier: Teressa Senter Other Clinician: Referring Haruka Kowaleski: Treating  Andora Krull/Extender: Clare Charon in Treatment: 1 Encounter Discharge Information Items Discharge Condition: Stable Ambulatory Status: Wheelchair Discharge Destination: Home Transportation: Private Auto Accompanied By: daughter Schedule Follow-up Appointment: Yes Clinical Summary of Care: Patient Declined Electronic Signature(s) Signed: 03/02/2022 5:01:19 PM By: Rhae Hammock RN Entered By: Rhae Hammock on 03/02/2022 15:17:24 -------------------------------------------------------------------------------- Lower Extremity Assessment Details Patient Name: Date of Service: [name], Danielle Dess [name] 03/02/2022 2:30 PM Medical Record Number: [id number] Patient Account Number: [id number] Date of Birth/Sex: Treating RN: [birth year] (61 y.o. [name] Primary Care Renatta Shrieves: Teressa Senter Other Clinician: Referring Alicha Raspberry: Treating Alecea Trego/Extender: Clare Charon in Treatment: 1 Electronic Signature(s) Signed: 03/02/2022 4:44:02 PM By: Erenest Blank Signed: 03/02/2022 5:01:19 PM By: Rhae Hammock RN Entered By: Erenest Blank on 03/02/2022 15:10:34 -------------------------------------------------------------------------------- Multi Wound Chart Details Patient Name: Date of Service: Burgess Amor, DEBO [name] 03/02/2022 2:30 PM Medical Record Number: [id number] Patient Account Number: [id number] Date of Birth/Sex: Treating RN: Dec 26, 1960 (61 y.o. [name] Primary Care Juanpablo Ciresi: Teressa Senter Other Clinician: Referring Kayla Deshaies: Treating Spenser Cong/Extender: Deitra Mayo, Boyd Weeks in Treatment: 1 Vital Signs Height(in): 64 Capillary Blood Glucose(mg/dl): 112 Weight(lbs): 248 Pulse(bpm): 22 Body Mass Index(BMI): 42.6 Blood Pressure(mmHg): 141/79 Temperature(F): 98.5 Respiratory Rate(breaths/min): 18 Photos: [N/A:N/A] Right Groin N/A N/A Wound Location: Surgical Injury N/A  N/A Wounding Event: Open Surgical Wound N/A N/A Primary Etiology: Hypertension, Type II Diabetes, N/A N/A Comorbid History: Neuropathy 01/13/2022 N/A N/A Date Acquired: 1 N/A N/A Weeks of Treatment: Open N/A N/A Wound Status: No N/A N/A Wound Recurrence: 7x6.5x2.5 N/A N/A Measurements L x W x D (cm) 35.736 N/A N/A A (cm) : rea 89.339 N/A N/A Volume (cm) : 31.60% N/A N/A % Reduction in Area: 36.60% N/A N/A % Reduction in Volume: Full Thickness Without Exposed N/A N/A Classification: Support Structures Medium N/A N/A Exudate Amount:  Serosanguineous N/A N/A Exudate Type: red, brown N/A N/A Exudate Color: Distinct, outline attached N/A N/A Wound Margin: Large (67-100%) N/A N/A Granulation Amount: Red, Pink N/A N/A Granulation Quality: Small (1-33%) N/A N/A Necrotic Amount: Fat Layer (Subcutaneous Tissue): Yes N/A N/A Exposed Structures: Fascia: No Tendon: No Muscle: No Joint: No Bone: No Small (1-33%) N/A N/A Epithelialization: Treatment Notes Wound #1 (Groin) Wound Laterality: Right Cleanser Wound Cleanser Discharge Instruction: Cleanse the wound with wound cleanser prior to applying a clean dressing using gauze sponges, not tissue or cotton balls. Peri-Wound Care Skin Prep Discharge Instruction: Use skin prep as directed Topical Primary Dressing Dakin's Solution 0.25%, 16 (oz) Discharge Instruction: Moisten gauze with Dakin's solution 2" conform Discharge Instruction: moisten the 2' conform with Dakin's lightly pack into wound bed and undermining. Secondary Dressing Zetuvit Plus 4x8 in Discharge Instruction: Apply over primary dressing as directed. Secured With 64M Medipore H Soft Cloth Surgical T ape, 4 x 10 (in/yd) Discharge Instruction: Secure with tape as directed. Compression Wrap Compression Stockings Add-Ons Electronic Signature(s) Signed: 03/02/2022 5:01:19 PM By: Rhae Hammock RN Signed: 03/03/2022 1:41:40 PM By: Kalman Shan  DO Entered By: Kalman Shan on 03/02/2022 16:41:56 -------------------------------------------------------------------------------- Multi-Disciplinary Care Plan Details Patient Name: Date of Service: Burgess Amor, DEBO [name] 03/02/2022 2:30 PM Medical Record Number: [id number] Patient Account Number: [id number] Date of Birth/Sex: Treating RN: [birth year] (61 y.o. [name] Primary Care Seleena Reimers: Teressa Senter Other Clinician: Referring Alondra Sahni: Treating Albirtha Grinage/Extender: Deitra Mayo, Boyd Weeks in Treatment: 1 Active Inactive Pain, Acute or Chronic Nursing Diagnoses: Pain, acute or chronic: actual or potential Potential alteration in comfort, pain Goals: Patient will verbalize adequate pain control and receive pain control interventions during procedures as needed Date Initiated: 02/17/2022 Target Resolution Date: 03/25/2022 Goal Status: Active Patient/caregiver will verbalize comfort level met Date Initiated: 02/17/2022 Target Resolution Date: 03/25/2022 Goal Status: Active Interventions: Encourage patient to take pain medications as prescribed Provide education on pain management Treatment Activities: Administer pain control measures as ordered : 02/17/2022 Notes: Wound/Skin Impairment Nursing Diagnoses: Knowledge deficit related to ulceration/compromised skin integrity Goals: Patient/caregiver will verbalize understanding of skin care regimen Date Initiated: 02/17/2022 Target Resolution Date: 03/25/2022 Goal Status: Active Interventions: Assess patient/caregiver ability to perform ulcer/skin care regimen upon admission and as needed Assess ulceration(s) every visit Provide education on ulcer and skin care Treatment Activities: Skin care regimen initiated : 02/17/2022 Topical wound management initiated : 02/17/2022 Notes: Electronic Signature(s) Signed: 03/02/2022 5:01:19 PM By: Rhae Hammock RN Entered By: Rhae Hammock on 03/02/2022  15:15:42 -------------------------------------------------------------------------------- Pain Assessment Details Patient Name: Date of Service: [name], Danielle Dess RA H G. 03/02/2022 2:30 PM Medical Record Number: [id number] Patient Account Number: [id number] Date of Birth/Sex: Treating RN: [birth year] (61 y.o. [name] Primary Care Sache Sane: Teressa Senter Other Clinician: Referring Raymonde Hamblin: Treating Jari Carollo/Extender: Clare Charon in Treatment: 1 Active Problems Location of Pain Severity and Description of Pain Patient Has Paino No Site Locations Pain Management and Medication Current Pain Management: Electronic Signature(s) Signed: 03/02/2022 4:44:02 PM By: Erenest Blank Signed: 03/02/2022 5:01:19 PM By: Rhae Hammock RN Entered By: Erenest Blank on 03/02/2022 14:56:11 -------------------------------------------------------------------------------- Patient/Caregiver Education Details Patient Name: Date of Service: [name] 8/10/2023andnbsp2:30 PM Medical Record Number: [id number] Patient Account Number: [id number] Date of Birth/Gender: Treating RN: 12/19/1960 (61 y.o. Benjaman Lobe Primary Care Physician: Teressa Senter Other Clinician: Referring Physician: Treating Physician/Extender: Clare Charon in Treatment: 1 Education Assessment Education Provided To: Patient Education Topics  Provided Wound/Skin Impairment: Methods: Explain/Verbal Responses: State content correctly Electronic Signature(s) Signed: 03/02/2022 5:01:19 PM By: Rhae Hammock RN Entered By: Rhae Hammock on 03/02/2022 15:15:56 -------------------------------------------------------------------------------- Wound Assessment Details Patient Name: Date of Service: [name], DEBO RA H G. 03/02/2022 2:30 PM Medical Record Number: [id number] Patient Account Number: [id number] Date of Birth/Sex: Treating  RN: [birth year] (61 y.o. [name] Primary Care Ruthann Angulo: Teressa Senter Other Clinician: Referring Breann Losano: Treating Wilbon Obenchain/Extender: Deitra Mayo, Boyd Weeks in Treatment: 1 Wound Status Wound Number: 1 Primary Etiology: Open Surgical Wound Wound Location: Right Groin Wound Status: Open Wounding Event: Surgical Injury Comorbid History: Hypertension, Type II Diabetes, Neuropathy Date Acquired: 01/13/2022 Weeks Of Treatment: 1 Clustered Wound: No Photos Wound Measurements Length: (cm) 7 Width: (cm) 6.5 Depth: (cm) 2.5 Area: (cm) 35.736 Volume: (cm) 89.339 % Reduction in Area: 31.6% % Reduction in Volume: 36.6% Epithelialization: Small (1-33%) Tunneling: No Undermining: No Wound Description Classification: Full Thickness Without Exposed Support Structures Wound Margin: Distinct, outline attached Exudate Amount: Medium Exudate Type: Serosanguineous Exudate Color: red, brown Foul Odor After Cleansing: No Slough/Fibrino Yes Wound Bed Granulation Amount: Large (67-100%) Exposed Structure Granulation Quality: Red, Pink Fascia Exposed: No Necrotic Amount: Small (1-33%) Fat Layer (Subcutaneous Tissue) Exposed: Yes Necrotic Quality: Adherent Slough Tendon Exposed: No Muscle Exposed: No Joint Exposed: No Bone Exposed: No Treatment Notes Wound #1 (Groin) Wound Laterality: Right Cleanser Wound Cleanser Discharge Instruction: Cleanse the wound with wound cleanser prior to applying a clean dressing using gauze sponges, not tissue or cotton balls. Peri-Wound Care Skin Prep Discharge Instruction: Use skin prep as directed Topical Primary Dressing Dakin's Solution 0.25%, 16 (oz) Discharge Instruction: Moisten gauze with Dakin's solution 2" conform Discharge Instruction: moisten the 2' conform with Dakin's lightly pack into wound bed and undermining. Secondary Dressing Zetuvit Plus 4x8 in Discharge Instruction: Apply over primary dressing as  directed. Secured With 28M Medipore H Soft Cloth Surgical T ape, 4 x 10 (in/yd) Discharge Instruction: Secure with tape as directed. Compression Wrap Compression Stockings Add-Ons Electronic Signature(s) Signed: 03/02/2022 5:01:19 PM By: Rhae Hammock RN Signed: 03/02/2022 5:06:20 PM By: Deon Pilling RN, BSN Entered By: Deon Pilling on 03/02/2022 15:06:36 -------------------------------------------------------------------------------- Vitals Details Patient Name: Date of Service: [name], DEBO RA H G. 03/02/2022 2:30 PM Medical Record Number: [id number] Patient Account Number: [id number] Date of Birth/Sex: Treating RN: May 07, 1961 (61 y.o. [name] Primary Care Evanny Ellerbe: Teressa Senter Other Clinician: Referring Sally-Ann Cutbirth: Treating Kennet Mccort/Extender: Deitra Mayo, Boyd Weeks in Treatment: 1 Vital Signs Time Taken: 14:55 Temperature (F): 98.5 Height (in): 64 Pulse (bpm): 84 Weight (lbs): 248 Respiratory Rate (breaths/min): 18 Body Mass Index (BMI): 42.6 Blood Pressure (mmHg): 141/79 Capillary Blood Glucose (mg/dl): 112 Reference Range: 80 - 120 mg / dl Electronic Signature(s) Signed: 03/02/2022 4:44:02 PM By: Erenest Blank Signed: 03/02/2022 4:44:02 PM By: Erenest Blank Entered By: Erenest Blank on 03/02/2022 14:56:05
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
## 2022-03-16 ENCOUNTER — Ambulatory Visit: Payer: Medicare HMO | Admitting: Internal Medicine

## 2022-03-16 ENCOUNTER — Encounter (HOSPITAL_BASED_OUTPATIENT_CLINIC_OR_DEPARTMENT_OTHER): Payer: Medicare HMO | Admitting: Internal Medicine

## 2022-03-16 DIAGNOSIS — S31502A Unspecified open wound of unspecified external genital organs, female, initial encounter: Secondary | ICD-10-CM

## 2022-03-16 DIAGNOSIS — S31819A Unspecified open wound of right buttock, initial encounter: Secondary | ICD-10-CM

## 2022-03-16 DIAGNOSIS — E11622 Type 2 diabetes mellitus with other skin ulcer: Secondary | ICD-10-CM | POA: Diagnosis not present

## 2022-03-16 DIAGNOSIS — M726 Necrotizing fasciitis: Secondary | ICD-10-CM | POA: Diagnosis not present
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
## 2022-03-17 NOTE — Progress Notes (Signed)
[name], [name] ([id number]) Visit Report for 03/16/2022 Arrival Information Details Patient Name: Date of Service: [name] 03/16/2022 2:30 PM Medical Record Number: [id number] Patient Account Number: [id number] Date of Birth/Sex: Treating RN: 1960/08/19 (61 y.o. [name], Lauren Primary Care Asianae Minkler: Teressa Senter Other Clinician: Referring Magdeline Prange: Treating Takasha Vetere/Extender: Clare Charon in Treatment: 3 Visit Information History Since Last Visit Added or deleted any medications: No Patient Arrived: Wheel Chair Any new allergies or adverse reactions: No Arrival Time: 14:26 Had a fall or experienced change in No Accompanied By: daughter activities of daily living that may affect Transfer Assistance: None risk of falls: Patient Identification Verified: Yes Signs or symptoms of abuse/neglect since last visito No Secondary Verification Process Completed: Yes Hospitalized since last visit: No Patient Requires Transmission-Based Precautions: No Implantable device outside of the clinic excluding No Patient Has Alerts: No cellular tissue based products placed in the center since last visit: Has Dressing in Place as Prescribed: Yes Pain Present Now: No Electronic Signature(s) Signed: 03/16/2022 3:58:21 PM By: Rhae Hammock RN Entered By: Rhae Hammock on 03/16/2022 14:27:25 -------------------------------------------------------------------------------- Clinic Level of Care Assessment Details Patient Name: Date of Service: [name] 03/16/2022 2:30 PM Medical Record Number: [id number] Patient Account Number: [id number] Date of Birth/Sex: Treating RN: Dec 22, 1960 (61 y.o. [name], Lauren Primary Care Jin Capote: Teressa Senter Other Clinician: Referring Rache Klimaszewski: Treating Emmerson Shuffield/Extender: Clare Charon in Treatment: 3 Clinic Level of Care Assessment Items TOOL 4 Quantity  Score X- 1 0 Use when only an EandM is performed on FOLLOW-UP visit ASSESSMENTS - Nursing Assessment / Reassessment X- 1 10 Reassessment of Co-morbidities (includes updates in patient status) X- 1 5 Reassessment of Adherence to Treatment Plan ASSESSMENTS - Wound and Skin A ssessment / Reassessment X - Simple Wound Assessment / Reassessment - one wound 1 5 _0  - 0 Complex Wound Assessment / Reassessment - multiple wounds _1  - 0 Dermatologic / Skin Assessment (not related to wound area) ASSESSMENTS - Focused Assessment _2  - 0 Circumferential Edema Measurements - multi extremities _3  - 0 Nutritional Assessment / Counseling / Intervention _4  - 0 Lower Extremity Assessment (monofilament, tuning fork, pulses) _5  - 0 Peripheral Arterial Disease Assessment (using hand held doppler) ASSESSMENTS - Ostomy and/or Continence Assessment and Care _6  - 0 Incontinence Assessment and Management _7  - 0 Ostomy Care Assessment and Management (repouching, etc.) PROCESS - Coordination of Care X - Simple Patient / Family Education for ongoing care 1 15 _8  - 0 Complex (extensive) Patient / Family Education for ongoing care X- 1 10 Staff obtains Programmer, systems, Records, T Results / Process Orders est _9  - 0 Staff telephones HHA, Nursing Homes / Clarify orders / etc _10  - 0 Routine Transfer to another Facility (non-emergent condition) _11  - 0 Routine Hospital Admission (non-emergent condition) _12  - 0 New Admissions / Biomedical engineer / Ordering NPWT Apligraf, etc. , _13  - 0 Emergency Hospital Admission (emergent condition) X- 1 10 Simple Discharge Coordination _14  - 0 Complex (extensive) Discharge Coordination PROCESS - Special Needs _15  - 0 Pediatric / Minor Patient Management _16  - 0 Isolation Patient Management _17  - 0 Hearing / Language / Visual special needs _18  - 0 Assessment of Community assistance (transportation, D/C planning, etc.) _19  - 0 Additional assistance / Altered  mentation _20  - 0 Support Surface(s) Assessment (bed, cushion, seat, etc.) INTERVENTIONS - Wound Cleansing / Measurement X - Simple Wound Cleansing - one wound 1 5 _21  - 0 Complex Wound  Cleansing - multiple wounds X- 1 5 Wound Imaging (photographs - any number of wounds) _0  - 0 Wound Tracing (instead of photographs) X- 1 5 Simple Wound Measurement - one wound _1  - 0 Complex Wound Measurement - multiple wounds INTERVENTIONS - Wound Dressings X - Small Wound Dressing one or multiple wounds 1 10 _2  - 0 Medium Wound Dressing one or multiple wounds _3  - 0 Large Wound Dressing one or multiple wounds X- 1 5 Application of Medications - topical <PPIRJJOACZYSAYTK>_1<\/SWFUXNATFTDDUKGU>_5  - 0 Application of Medications - injection INTERVENTIONS - Miscellaneous _5  - 0 External ear exam _6  - 0 Specimen Collection (cultures, biopsies, blood, body fluids, etc.) _7  - 0 Specimen(s) / Culture(s) sent or taken to Lab for analysis _8  - 0 Patient Transfer (multiple staff / Civil Service fast streamer / Similar devices) _9  - 0 Simple Staple / Suture removal (25 or less) _10  - 0 Complex Staple / Suture removal (26 or more) _11  - 0 Hypo / Hyperglycemic Management (close monitor of Blood Glucose) _12  - 0 Ankle / Brachial Index (ABI) - do not check if billed separately X- 1 5 Vital Signs Has the patient been seen at the hospital within the last three years: Yes Total Score: 90 Level Of Care: New/Established - Level 3 Electronic Signature(s) Signed: 03/16/2022 3:58:21 PM By: Rhae Hammock RN Entered By: Rhae Hammock on 03/16/2022 15:11:14 -------------------------------------------------------------------------------- Encounter Discharge Information Details Patient Name: Date of Service: [name], [name] RA H G. 03/16/2022 2:30 PM Medical Record Number: [id number] Patient Account Number: [id number] Date of Birth/Sex: Treating RN: 1960-09-27 (61 y.o. [name], Lauren Primary Care Jaselynn Tamas: Teressa Senter Other Clinician: Referring  Yuleni Burich: Treating Daylee Delahoz/Extender: Clare Charon in Treatment: 3 Encounter Discharge Information Items Discharge Condition: Stable Ambulatory Status: Wheelchair Discharge Destination: Home Transportation: Private Auto Accompanied By: daughter Schedule Follow-up Appointment: Yes Clinical Summary of Care: Patient Declined Electronic Signature(s) Signed: 03/16/2022 3:58:21 PM By: Rhae Hammock RN Entered By: Rhae Hammock on 03/16/2022 15:29:56 -------------------------------------------------------------------------------- Lower Extremity Assessment Details Patient Name: Date of Service: [name], [name] 03/16/2022 2:30 PM Medical Record Number: [id number] Patient Account Number: [id number] Date of Birth/Sex: Treating RN: [birth year] (61 y.o. [name], Lauren Primary Care Richardson Dubree: Teressa Senter Other Clinician: Referring Lucila Klecka: Treating Rashan Rounsaville/Extender: Clare Charon in Treatment: 3 Electronic Signature(s) Signed: 03/16/2022 3:58:21 PM By: Rhae Hammock RN Entered By: Rhae Hammock on 03/16/2022 14:22:24 -------------------------------------------------------------------------------- Multi Wound Chart Details Patient Name: Date of Service: [name], [name] 03/16/2022 2:30 PM Medical Record Number: [id number] Patient Account Number: [id number] Date of Birth/Sex: Treating RN: 22-Dec-1960 (61 y.o. [name], Lauren Primary Care Mahamadou Weltz: Teressa Senter Other Clinician: Referring Demareon Coldwell: Treating Kaiyana Bedore/Extender: Deitra Mayo, Lauren Weeks in Treatment: 3 Vital Signs Height(in): 64 Capillary Blood Glucose(mg/dl): 120 Weight(lbs): 248 Pulse(bpm): 80 Body Mass Index(BMI): 42.6 Blood Pressure(mmHg): 131/79 Temperature(F): 98.3 Respiratory Rate(breaths/min): 17 Photos: [N/A:N/A] Right Groin N/A N/A Wound Location: Surgical Injury N/A N/A Wounding Event: Open  Surgical Wound N/A N/A Primary Etiology: Hypertension, Type II Diabetes, N/A N/A Comorbid History: Neuropathy 01/13/2022 N/A N/A Date Acquired: 3 N/A N/A Weeks of Treatment: Open N/A N/A Wound Status: No N/A N/A Wound Recurrence: 7x5.5x1.5 N/A N/A Measurements L x W x D (cm) 30.238 N/A N/A A (cm) : rea 45.357 N/A N/A Volume (cm) : 42.10% N/A N/A % Reduction in Area: 67.80% N/A N/A % Reduction in Volume: Full Thickness Without Exposed N/A N/A Classification: Support Structures Medium N/A N/A Exudate Amount: Serosanguineous N/A N/A Exudate Type:  red, brown N/A N/A Exudate Color: Distinct, outline attached N/A N/A Wound Margin: Large (67-100%) N/A N/A Granulation Amount: Red, Pink N/A N/A Granulation Quality: Small (1-33%) N/A N/A Necrotic Amount: Fat Layer (Subcutaneous Tissue): Yes N/A N/A Exposed Structures: Fascia: No Tendon: No Muscle: No Joint: No Bone: No Small (1-33%) N/A N/A Epithelialization: Treatment Notes Electronic Signature(s) Signed: 03/16/2022 3:58:21 PM By: Rhae Hammock RN Signed: 03/17/2022 10:24:04 AM By: Kalman Shan DO Entered By: Kalman Shan on 03/16/2022 14:58:22 -------------------------------------------------------------------------------- Multi-Disciplinary Care Plan Details Patient Name: Date of Service: [name], [name] Dess RA H G. 03/16/2022 2:30 PM Medical Record Number: [id number] Patient Account Number: [id number] Date of Birth/Sex: Treating RN: 1960/10/18 (61 y.o. [name], Lauren Primary Care Ladene Allocca: Teressa Senter Other Clinician: Referring Mukhtar Shams: Treating Luvinia Lucy/Extender: Deitra Mayo, Lauren Weeks in Treatment: 3 Active Inactive Pain, Acute or Chronic Nursing Diagnoses: Pain, acute or chronic: actual or potential Potential alteration in comfort, pain Goals: Patient will verbalize adequate pain control and receive pain control interventions during procedures as needed Date  Initiated: 02/17/2022 Target Resolution Date: 03/25/2022 Goal Status: Active Patient/caregiver will verbalize comfort level met Date Initiated: 02/17/2022 Target Resolution Date: 03/25/2022 Goal Status: Active Interventions: Encourage patient to take pain medications as prescribed Provide education on pain management Treatment Activities: Administer pain control measures as ordered : 02/17/2022 Notes: Wound/Skin Impairment Nursing Diagnoses: Knowledge deficit related to ulceration/compromised skin integrity Goals: Patient/caregiver will verbalize understanding of skin care regimen Date Initiated: 02/17/2022 Target Resolution Date: 03/25/2022 Goal Status: Active Interventions: Assess patient/caregiver ability to perform ulcer/skin care regimen upon admission and as needed Assess ulceration(s) every visit Provide education on ulcer and skin care Treatment Activities: Skin care regimen initiated : 02/17/2022 Topical wound management initiated : 02/17/2022 Notes: Electronic Signature(s) Signed: 03/16/2022 3:58:21 PM By: Rhae Hammock RN Entered By: Rhae Hammock on 03/16/2022 14:47:40 -------------------------------------------------------------------------------- Pain Assessment Details Patient Name: Date of Service: [name] Katina Dung RA H G. 03/16/2022 2:30 PM Medical Record Number: [id number] Patient Account Number: [id number] Date of Birth/Sex: Treating RN: 09-21-1960 (61 y.o. [name], Lauren Primary Care Felica Chargois: Teressa Senter Other Clinician: Referring Elanie Hammitt: Treating Chelbi Herber/Extender: Clare Charon in Treatment: 3 Active Problems Location of Pain Severity and Description of Pain Patient Has Paino No Site Locations Pain Management and Medication Current Pain Management: Electronic Signature(s) Signed: 03/16/2022 3:58:21 PM By: Rhae Hammock RN Entered By: Rhae Hammock on 03/16/2022  14:22:14 -------------------------------------------------------------------------------- Patient/Caregiver Education Details Patient Name: Date of Service: [name] 8/24/2023andnbsp2:30 PM Medical Record Number: [id number] Patient Account Number: [id number] Date of Birth/Gender: Treating RN: 09/12/1960 (61 y.o. [name] Primary Care Physician: Teressa Senter Other Clinician: Referring Physician: Treating Physician/Extender: Clare Charon in Treatment: 3 Education Assessment Education Provided To: Patient Education Topics Provided Wound/Skin Impairment: Methods: Explain/Verbal Responses: State content correctly Electronic Signature(s) Signed: 03/16/2022 3:58:21 PM By: Rhae Hammock RN Entered By: Rhae Hammock on 03/16/2022 14:47:50 -------------------------------------------------------------------------------- Wound Assessment Details Patient Name: Date of Service: [name], [name] Dess RA H G. 03/16/2022 2:30 PM Medical Record Number: [id number] Patient Account Number: [id number] Date of Birth/Sex: Treating RN: 23-Oct-1960 (61 y.o. [name], Lauren Primary Care Cristofher Livecchi: Teressa Senter Other Clinician: Referring Tanajah Boulter: Treating Ryshawn Sanzone/Extender: Deitra Mayo, Lauren Weeks in Treatment: 3 Wound Status Wound Number: 1 Primary Etiology: Open Surgical Wound Wound Location: Right Groin Wound Status: Open Wounding Event: Surgical Injury Comorbid History: Hypertension, Type II Diabetes, Neuropathy Date Acquired: 01/13/2022 Weeks Of Treatment: 3 Clustered Wound: No Photos Wound Measurements Length: (cm) 7  Width: (cm) 5.5 Depth: (cm) 1.5 Area: (cm) 30.238 Volume: (cm) 45.357 % Reduction in Area: 42.1% % Reduction in Volume: 67.8% Epithelialization: Small (1-33%) Tunneling: No Undermining: No Wound Description Classification: Full Thickness Without Exposed Support Structures Wound  Margin: Distinct, outline attached Exudate Amount: Medium Exudate Type: Serosanguineous Exudate Color: red, brown Foul Odor After Cleansing: No Slough/Fibrino Yes Wound Bed Granulation Amount: Large (67-100%) Exposed Structure Granulation Quality: Red, Pink Fascia Exposed: No Necrotic Amount: Small (1-33%) Fat Layer (Subcutaneous Tissue) Exposed: Yes Necrotic Quality: Adherent Slough Tendon Exposed: No Muscle Exposed: No Joint Exposed: No Bone Exposed: No Treatment Notes Wound #1 (Groin) Wound Laterality: Right Cleanser Wound Cleanser Discharge Instruction: Cleanse the wound with wound cleanser prior to applying a clean dressing using gauze sponges, not tissue or cotton balls. Peri-Wound Care Skin Prep Discharge Instruction: Use skin prep as directed Topical Primary Dressing Dakin's Solution 0.25%, 16 (oz) Discharge Instruction: Moisten gauze with Dakin's solution 2" conform Discharge Instruction: moisten the 2' conform with Dakin's lightly pack into wound bed and undermining. Secondary Dressing Zetuvit Plus 4x8 in Discharge Instruction: Apply over primary dressing as directed. Secured With 19M Medipore H Soft Cloth Surgical Tape, 4 x 10 (in/yd) Discharge Instruction: Secure with tape as directed. Compression Wrap Compression Stockings Add-Ons Electronic Signature(s) Signed: 03/16/2022 3:58:21 PM By: Rhae Hammock RN Signed: 03/16/2022 4:14:43 PM By: Erenest Blank Entered By: Erenest Blank on 03/16/2022 14:44:23 -------------------------------------------------------------------------------- Vitals Details Patient Name: Date of Service: [name], [name] RA H G. 03/16/2022 2:30 PM Medical Record Number: [id number] Patient Account Number: [id number] Date of Birth/Sex: Treating RN: 1960/12/05 (61 y.o. [name], Lauren Primary Care Xavior Niazi: Teressa Senter Other Clinician: Referring Alphons Burgert: Treating Teion Ballin/Extender: Deitra Mayo,  Lauren Weeks in Treatment: 3 Vital Signs Time Taken: 14:32 Temperature (F): 98.3 Height (in): 64 Pulse (bpm): 84 Weight (lbs): 248 Respiratory Rate (breaths/min): 17 Body Mass Index (BMI): 42.6 Blood Pressure (mmHg): 131/79 Capillary Blood Glucose (mg/dl): 120 Reference Range: 80 - 120 mg / dl Electronic Signature(s) Signed: 03/16/2022 3:58:21 PM By: Rhae Hammock RN Entered By: Rhae Hammock on 03/16/2022 14:32:54

## 2022-03-23 ENCOUNTER — Encounter: Payer: Self-pay | Admitting: Internal Medicine

## 2022-03-23 ENCOUNTER — Other Ambulatory Visit: Payer: Self-pay

## 2022-03-23 ENCOUNTER — Telehealth (INDEPENDENT_AMBULATORY_CARE_PROVIDER_SITE_OTHER): Payer: Medicare HMO | Admitting: Internal Medicine

## 2022-03-23 DIAGNOSIS — A429 Actinomycosis, unspecified: Secondary | ICD-10-CM | POA: Diagnosis not present

## 2022-03-23 DIAGNOSIS — M7989 Other specified soft tissue disorders: Secondary | ICD-10-CM

## 2022-03-23 NOTE — Progress Notes (Signed)
Virtual Visit via Telephone Note  I connected with [name] on 03/23/22 at  2:30 PM EDT by telephone and verified that I am speaking with the correct person using two identifiers.  Location: Patient: at home Provider: at clinic   I discussed the limitations, risks, security and privacy concerns of performing an evaluation and management service by telephone and the availability of in person appointments. I also discussed with the patient that there may be a patient responsible charge related to this service. The patient expressed understanding and agreed to proceed.   History of Present Illness: 61yo F with history of fournier's gangrene requiring multiple debridement. Cx showing actinomyces - now using wound vac, having evidence that is improving, size of wound and depth improving. Tolerating amoxicillin  - picked up last refill for amoxicillin on 8/30.  - no purulent drainage Observations/Objective: Fluent speech  Assessment and Plan: Continue with amoxicillin for addn 4 wk Recovering necrotizing fasciitis. Follow up of wound care specialty.  Follow Up Instructions: Will see back in 4 wk   I discussed the assessment and treatment plan with the patient. The patient was provided an opportunity to ask questions and all were answered. The patient agreed with the plan and demonstrated an understanding of the instructions.   The patient was advised to call back or seek an in-person evaluation if the symptoms worsen or if the condition fails to improve as anticipated.  I provided 10 minutes of non-face-to-face time during this encounter.   Carlyle Basques, MD

## 2022-03-30 ENCOUNTER — Encounter (HOSPITAL_BASED_OUTPATIENT_CLINIC_OR_DEPARTMENT_OTHER): Payer: Medicare HMO | Attending: Internal Medicine | Admitting: Internal Medicine

## 2022-03-30 DIAGNOSIS — E1140 Type 2 diabetes mellitus with diabetic neuropathy, unspecified: Secondary | ICD-10-CM | POA: Diagnosis not present

## 2022-03-30 DIAGNOSIS — E11622 Type 2 diabetes mellitus with other skin ulcer: Secondary | ICD-10-CM | POA: Diagnosis present

## 2022-03-30 DIAGNOSIS — K219 Gastro-esophageal reflux disease without esophagitis: Secondary | ICD-10-CM | POA: Insufficient documentation

## 2022-03-30 DIAGNOSIS — E1151 Type 2 diabetes mellitus with diabetic peripheral angiopathy without gangrene: Secondary | ICD-10-CM | POA: Insufficient documentation

## 2022-03-30 DIAGNOSIS — N183 Chronic kidney disease, stage 3 unspecified: Secondary | ICD-10-CM | POA: Diagnosis not present

## 2022-03-30 DIAGNOSIS — E1122 Type 2 diabetes mellitus with diabetic chronic kidney disease: Secondary | ICD-10-CM | POA: Insufficient documentation

## 2022-03-30 DIAGNOSIS — M726 Necrotizing fasciitis: Secondary | ICD-10-CM | POA: Diagnosis not present

## 2022-03-30 DIAGNOSIS — S31502A Unspecified open wound of unspecified external genital organs, female, initial encounter: Secondary | ICD-10-CM

## 2022-03-30 DIAGNOSIS — Z794 Long term (current) use of insulin: Secondary | ICD-10-CM | POA: Insufficient documentation

## 2022-03-30 DIAGNOSIS — S31819A Unspecified open wound of right buttock, initial encounter: Secondary | ICD-10-CM

## 2022-03-30 DIAGNOSIS — I129 Hypertensive chronic kidney disease with stage 1 through stage 4 chronic kidney disease, or unspecified chronic kidney disease: Secondary | ICD-10-CM | POA: Diagnosis not present

## 2022-04-03 ENCOUNTER — Encounter (HOSPITAL_COMMUNITY): Payer: Self-pay

## 2022-04-03 ENCOUNTER — Emergency Department (HOSPITAL_COMMUNITY): Payer: Medicare HMO

## 2022-04-03 ENCOUNTER — Observation Stay (HOSPITAL_COMMUNITY)
Admission: EM | Admit: 2022-04-03 | Discharge: 2022-04-05 | Disposition: A | Discharge status: Home / Self Care | Payer: Medicare HMO | Attending: Family Medicine | Admitting: Family Medicine

## 2022-04-03 ENCOUNTER — Other Ambulatory Visit: Payer: Self-pay

## 2022-04-03 DIAGNOSIS — E1122 Type 2 diabetes mellitus with diabetic chronic kidney disease: Secondary | ICD-10-CM | POA: Insufficient documentation

## 2022-04-03 DIAGNOSIS — E860 Dehydration: Secondary | ICD-10-CM | POA: Diagnosis not present

## 2022-04-03 DIAGNOSIS — Z6839 Body mass index (BMI) 39.0-39.9, adult: Secondary | ICD-10-CM | POA: Insufficient documentation

## 2022-04-03 DIAGNOSIS — S71101S Unspecified open wound, right thigh, sequela: Secondary | ICD-10-CM

## 2022-04-03 DIAGNOSIS — E669 Obesity, unspecified: Secondary | ICD-10-CM | POA: Insufficient documentation

## 2022-04-03 DIAGNOSIS — Z794 Long term (current) use of insulin: Secondary | ICD-10-CM | POA: Insufficient documentation

## 2022-04-03 DIAGNOSIS — R338 Other retention of urine: Secondary | ICD-10-CM | POA: Diagnosis not present

## 2022-04-03 DIAGNOSIS — N184 Chronic kidney disease, stage 4 (severe): Secondary | ICD-10-CM | POA: Diagnosis not present

## 2022-04-03 DIAGNOSIS — R339 Retention of urine, unspecified: Secondary | ICD-10-CM | POA: Insufficient documentation

## 2022-04-03 DIAGNOSIS — I951 Orthostatic hypotension: Secondary | ICD-10-CM | POA: Diagnosis not present

## 2022-04-03 DIAGNOSIS — N179 Acute kidney failure, unspecified: Secondary | ICD-10-CM | POA: Diagnosis not present

## 2022-04-03 DIAGNOSIS — K5900 Constipation, unspecified: Secondary | ICD-10-CM | POA: Insufficient documentation

## 2022-04-03 DIAGNOSIS — Z23 Encounter for immunization: Secondary | ICD-10-CM | POA: Diagnosis not present

## 2022-04-03 DIAGNOSIS — E1165 Type 2 diabetes mellitus with hyperglycemia: Secondary | ICD-10-CM | POA: Diagnosis not present

## 2022-04-03 DIAGNOSIS — Z79899 Other long term (current) drug therapy: Secondary | ICD-10-CM | POA: Insufficient documentation

## 2022-04-03 DIAGNOSIS — R531 Weakness: Secondary | ICD-10-CM | POA: Diagnosis present

## 2022-04-03 DIAGNOSIS — Z20822 Contact with and (suspected) exposure to covid-19: Secondary | ICD-10-CM | POA: Diagnosis not present

## 2022-04-03 DIAGNOSIS — N189 Chronic kidney disease, unspecified: Secondary | ICD-10-CM

## 2022-04-03 DIAGNOSIS — I10 Essential (primary) hypertension: Secondary | ICD-10-CM | POA: Diagnosis present

## 2022-04-03 DIAGNOSIS — R770 Abnormality of albumin: Secondary | ICD-10-CM | POA: Insufficient documentation

## 2022-04-03 DIAGNOSIS — I129 Hypertensive chronic kidney disease with stage 1 through stage 4 chronic kidney disease, or unspecified chronic kidney disease: Secondary | ICD-10-CM | POA: Diagnosis not present

## 2022-04-03 DIAGNOSIS — I952 Hypotension due to drugs: Secondary | ICD-10-CM

## 2022-04-03 DIAGNOSIS — I959 Hypotension, unspecified: Secondary | ICD-10-CM

## 2022-04-03 LAB — CBC WITH DIFFERENTIAL/PLATELET
Abs Immature Granulocytes: 0.04 10*3/uL (ref 0.00–0.07)
Basophils Absolute: 0 10*3/uL (ref 0.0–0.1)
Basophils Relative: 0 %
Eosinophils Absolute: 0.1 10*3/uL (ref 0.0–0.5)
Eosinophils Relative: 1 %
HCT: 35.7 % — ABNORMAL LOW (ref 36.0–46.0)
Hemoglobin: 11.5 g/dL — ABNORMAL LOW (ref 12.0–15.0)
Immature Granulocytes: 0 %
Lymphocytes Relative: 18 %
Lymphs Abs: 2.1 10*3/uL (ref 0.7–4.0)
MCH: 28.8 pg (ref 26.0–34.0)
MCHC: 32.2 g/dL (ref 30.0–36.0)
MCV: 89.5 fL (ref 80.0–100.0)
Monocytes Absolute: 0.8 10*3/uL (ref 0.1–1.0)
Monocytes Relative: 7 %
Neutro Abs: 8.8 10*3/uL — ABNORMAL HIGH (ref 1.7–7.7)
Neutrophils Relative %: 74 %
Platelets: 182 10*3/uL (ref 150–400)
RBC: 3.99 MIL/uL (ref 3.87–5.11)
RDW: 15.3 % (ref 11.5–15.5)
WBC: 11.8 10*3/uL — ABNORMAL HIGH (ref 4.0–10.5)
nRBC: 0 % (ref 0.0–0.2)

## 2022-04-03 LAB — PROTIME-INR
INR: 1.1 (ref 0.8–1.2)
Prothrombin Time: 14 seconds (ref 11.4–15.2)

## 2022-04-03 LAB — URINALYSIS, ROUTINE W REFLEX MICROSCOPIC
Bilirubin Urine: NEGATIVE
Glucose, UA: NEGATIVE mg/dL
Ketones, ur: NEGATIVE mg/dL
Nitrite: NEGATIVE
Protein, ur: NEGATIVE mg/dL
Specific Gravity, Urine: 1.008 (ref 1.005–1.030)
Trans Epithel, UA: <1
pH: 5 (ref 5.0–8.0)

## 2022-04-03 LAB — COMPREHENSIVE METABOLIC PANEL
ALT: 14 U/L (ref 0–44)
AST: 14 U/L — ABNORMAL LOW (ref 15–41)
Albumin: 3.3 g/dL — ABNORMAL LOW (ref 3.5–5.0)
Alkaline Phosphatase: 92 U/L (ref 38–126)
Anion gap: 10 (ref 5–15)
BUN: 70 mg/dL — ABNORMAL HIGH (ref 6–20)
CO2: 23 mmol/L (ref 22–32)
Calcium: 8.3 mg/dL — ABNORMAL LOW (ref 8.9–10.3)
Chloride: 103 mmol/L (ref 98–111)
Creatinine, Ser: 2.71 mg/dL — ABNORMAL HIGH (ref 0.44–1.00)
GFR, Estimated: 20 mL/min — ABNORMAL LOW (ref >60)
Glucose, Bld: 151 mg/dL — ABNORMAL HIGH (ref 70–99)
Potassium: 4.3 mmol/L (ref 3.5–5.1)
Sodium: 136 mmol/L (ref 135–145)
Total Bilirubin: 0.9 mg/dL (ref 0.3–1.2)
Total Protein: 6.9 g/dL (ref 6.5–8.1)

## 2022-04-03 LAB — BRAIN NATRIURETIC PEPTIDE: B Natriuretic Peptide: 16 pg/mL (ref 0.0–100.0)

## 2022-04-03 LAB — APTT: aPTT: 26 seconds (ref 24–36)

## 2022-04-03 LAB — SARS CORONAVIRUS 2 BY RT PCR: SARS Coronavirus 2 by RT PCR: NEGATIVE

## 2022-04-03 LAB — CBG MONITORING, ED: Glucose-Capillary: 147 mg/dL — ABNORMAL HIGH (ref 70–99)

## 2022-04-03 LAB — LACTIC ACID, PLASMA: Lactic Acid, Venous: 1.2 mmol/L (ref 0.5–1.9)

## 2022-04-03 MED ORDER — LACTATED RINGERS IV BOLUS
1000.0000 mL | Freq: Once | INTRAVENOUS | Status: AC
  Administered 2022-04-03: 1000 mL via INTRAVENOUS

## 2022-04-03 MED ORDER — INFLUENZA VAC SPLIT QUAD 0.5 ML IM SUSY
0.5000 mL | PREFILLED_SYRINGE | INTRAMUSCULAR | Status: AC
  Administered 2022-04-04: 0.5 mL via INTRAMUSCULAR
  Filled 2022-04-03: qty 0.5

## 2022-04-03 MED ORDER — GLUCERNA SHAKE PO LIQD
237.0000 mL | Freq: Three times a day (TID) | ORAL | Status: DC
  Administered 2022-04-04: 237 mL via ORAL
  Filled 2022-04-03 (×2): qty 237

## 2022-04-03 MED ORDER — ACETAMINOPHEN 650 MG RE SUPP: 650.0000 mg | Freq: Four times a day (QID) | RECTAL | Status: DC | PRN

## 2022-04-03 MED ORDER — SODIUM CHLORIDE 0.9 % IV SOLN: INTRAVENOUS | Status: AC

## 2022-04-03 MED ORDER — SODIUM CHLORIDE 0.9 % IV BOLUS
1000.0000 mL | Freq: Once | INTRAVENOUS | Status: AC
  Administered 2022-04-03: 1000 mL via INTRAVENOUS

## 2022-04-03 MED ORDER — INSULIN ASPART 100 UNIT/ML IJ SOLN
0.0000 [IU] | Freq: Three times a day (TID) | INTRAMUSCULAR | Status: DC
  Administered 2022-04-05: 1 [IU] via SUBCUTANEOUS

## 2022-04-03 MED ORDER — ACETAMINOPHEN 325 MG PO TABS
650.0000 mg | ORAL_TABLET | Freq: Four times a day (QID) | ORAL | Status: DC | PRN
  Administered 2022-04-04 – 2022-04-05 (×3): 650 mg via ORAL
  Filled 2022-04-03 (×3): qty 2

## 2022-04-03 MED ORDER — ENOXAPARIN SODIUM 30 MG/0.3ML IJ SOSY
30.0000 mg | PREFILLED_SYRINGE | INTRAMUSCULAR | Status: DC
  Filled 2022-04-03: qty 0.3

## 2022-04-03 MED ORDER — SODIUM CHLORIDE 0.9 % IV BOLUS (SEPSIS)
2000.0000 mL | Freq: Once | INTRAVENOUS | Status: AC
  Administered 2022-04-03: 2000 mL via INTRAVENOUS

## 2022-04-03 NOTE — ED Notes (Signed)
Pt has not urinated at this time

## 2022-04-03 NOTE — ED Triage Notes (Signed)
Per EMS patient vagal  down last night and took her BP medicine this morning and has been lethargic. Per EMS patient had BP initially 50/39. Patient alert in triage.

## 2022-04-03 NOTE — H&P (Addendum)
History and Physical    Patient: [name] TDD:[id number] DOB: [birth year] DOA: 04/03/2022 DOS: the patient was seen and examined on 04/03/2022 PCP: Teressa Senter, FNP  Patient coming from: Home  Chief Complaint:  Chief Complaint  Patient presents with   Weakness   HPI: [name] is a 61 y.o. female with medical history significant of essential hypertension, type 2 diabetes mellitus, CKD stage IV, diabetic neuropathy and obesity who presents to the emergency department via EMS due to concern for syncope.  Patient complained of 2-day onset of feeling weak and lightheaded with lack of energy, she complained of abdominal pain and she thought it was due to need for bowel movement, so she went to the bathroom, but she needed to "bear down" several times within the 2-hours spent sitting on the commode without being able to have any bowel movement, she states that during this time,  She felt very weak to be able to get up from the commode and felt lightheaded, fortunately, daughter visited her at home (she lives alone) and when she saw her condition, she activated EMS (of note, patient states that she had similar symptoms last night).  On arrival of EMS team, BP was noted to be low at 60/30, Accu-Chek was within normal limits and patient was taken to the ED for further evaluation and management.  No IV fluid was provided prior to arrival to the ED. Patient states that she takes her blood pressure medication at home and she endorsed to not drinking enough water at home. Patient was admitted from 6/23 to 6/29 due to sepsis on admission secondary to abscess status postdebridement (6/24), general surgery treated with Flagyl and cefepime and ID consulted started patient on amox-clav 875 mg po bid for 3 more days until 7/01 on which date can transition to amoxicillin 750 mg po tid for planned 3 months.  She followed up with infectious disease about 2 weeks ago by televisit and was asked to  continue with amoxicillin for another 4 weeks.   ED Course:  In the emergency department, BP was 98/40 on arrival to the ED, but other vital signs were within normal range.  Work-up in the ED showed normocytic anemia and WBC of 11.8.  BMP was normal except for glucose of 151, BUN/Creatinine 70/2.71 (baseline creatinine at 1.0-1.3).  BNP and lactic acid were normal.  Urinalysis was unimpressive for UTI.  SARS coronavirus was negative.  Blood culture pending Chest x-ray showed low lung volumes.  No definite acute lung process Patient was provided with IV hydration, hospitalist was asked to admit patient for further evaluation and management.  Review of Systems: Review of systems as noted in the HPI. All other systems reviewed and are negative.   Past Medical History:  Diagnosis Date   Chest pain    a. 06/2017: cath showing normal cors with a preserved EF of 55-65%.    Depression    Diabetes mellitus    Hypertension    Neuropathy    Pneumonia    Renal disorder    Sepsis ([place])    Past Surgical History:  Procedure Laterality Date   ABDOMINAL HYSTERECTOMY     APPENDECTOMY     CESAREAN SECTION     CHOLECYSTECTOMY     IRRIGATION AND DEBRIDEMENT BUTTOCKS Right 01/13/2022   Procedure: IRRIGATION AND DEBRIDEMENT BUTTOCKS AND THIGH;  Surgeon: Rusty Aus, DO;  Location: [facility];  Service: General;  Laterality: Right;   LEFT HEART CATH AND CORONARY ANGIOGRAPHY  N/A 07/12/2017   Procedure: LEFT HEART CATH AND CORONARY ANGIOGRAPHY;  Surgeon: Lorretta Harp, MD;  Location: [facility];  Service: Cardiovascular;  Laterality: N/A;    Social History:  reports that she has never smoked. She has never used smokeless tobacco. She reports that she does not drink alcohol and does not use drugs.   Allergies  Allergen Reactions   Ibuprofen Swelling   Red Dye     Other reaction(s): Nervous syncope   Codeine Palpitations   Nitrofurantoin Diarrhea and Nausea And Vomiting     Family History  Problem Relation Age of Onset   Diabetes Mother    Heart failure Mother    Hypertension Mother    Cancer Mother    CAD Mother 72       Died of MI   Diabetes Father    Alcohol abuse Father      Prior to Admission medications   Medication Sig Start Date End Date Taking? Authorizing Provider  acetaminophen (TYLENOL) 325 MG tablet Take 2 tablets (650 mg total) by mouth every 6 (six) hours as needed for moderate pain or fever. 10/09/21  Yes Jaynee Eagles, PA-C  amoxicillin (AMOXIL) 500 MG capsule Take 2 capsules (1,000 mg total) by mouth every 8 (eight) hours. 01/22/22  Yes Geradine Girt, DO  carbamazepine (TEGRETOL) 200 MG tablet Take 200 mg by mouth 2 (two) times daily. 02/19/22  Yes [provider]  DULoxetine (CYMBALTA) 60 MG capsule Take 60 mg by mouth 2 (two) times daily.    Yes [provider]  enalapril (VASOTEC) 10 MG tablet Take 10 mg by mouth daily. 02/23/22  Yes [provider]  escitalopram (LEXAPRO) 10 MG tablet Take 10 mg by mouth daily. 02/15/21  Yes [provider]  furosemide (LASIX) 40 MG tablet Take 1 tablet (40 mg total) by mouth daily. Start 11/25/2021 11/25/21 11/25/22 Yes Emokpae, Courage, MD  gabapentin (NEURONTIN) 600 MG tablet Take 0.5 tablets (300 mg total) by mouth 3 (three) times daily. Patient taking differently: Take 600 mg by mouth in the morning, at noon, in the evening, and at bedtime. 05/18/21  Yes Kathie Dike, MD  MOUNJARO 5 MG/0.5ML Pen Inject 10 mg into the skin once a week. 11/23/21  Yes [provider]  NOVOLOG RELION 100 UNIT/ML injection Inject 4-20 Units into the skin 3 (three) times daily with meals. Per sliding scale:70-130; 0 units, 130 to 180; 4 units; 180-240; 8 units; 241-300 ; 10 units; 301-350; 12 units; 351-400; 16 units. > 400 20 units and call MD 10/31/21  Yes [provider]  prazosin (MINIPRESS) 2 MG capsule Take 2 mg by mouth at bedtime. 03/15/21  Yes [provider]   dicyclomine (BENTYL) 20 MG tablet Take 20 mg by mouth 4 (four) times daily as needed for cramping. Patient not taking: Reported on 04/03/2022 01/03/22   [provider]  dicyclomine (BENTYL) 20 MG tablet Take by mouth. Patient not taking: Reported on 04/03/2022 01/03/22   [provider]  fluconazole (DIFLUCAN) 150 MG tablet Take by mouth. Patient not taking: Reported on 04/03/2022 01/27/22   [provider]  leptospermum manuka honey (MEDIHONEY) PSTE paste Apply 1 Application topically daily. Patient not taking: Reported on 03/23/2022 01/19/22   Geradine Girt, DO  oxyCODONE-acetaminophen (PERCOCET/ROXICET) 5-325 MG tablet Take 1 tablet by mouth every 6 (six) hours as needed for severe pain. Patient not taking: Reported on 03/23/2022 02/06/22   Fredia Sorrow, MD    Physical  Exam: BP (!) 119/52   Pulse 97   Temp 99.1 F (37.3 C) (Oral)   Resp (!) 22   Ht '5\' 4"'$  (1.626 m)   Wt 104.3 kg   SpO2 95%   BMI 39.48 kg/m   General: 61 y.o. year-old female ill appearing, but in no acute distress.  Alert and oriented x3. HEENT: NCAT, EOMI, dry mucous membrane Neck: Supple, trachea medial Cardiovascular: Regular rate and rhythm with no rubs or gallops.  No thyromegaly or JVD noted.  No lower extremity edema. 2/4 pulses in all 4 extremities. Respiratory: Clear to auscultation with no wheezes or rales. Good inspiratory effort. Abdomen: Soft, nontender nondistended with normal bowel sounds x4 quadrants. Muskuloskeletal: Noted ulcerating wound in the right thigh with clean borders and no drainage.   Neuro: CN II-XII intact, sensation, reflexes intact Skin: Warm and dry Psychiatry: Judgement and insight appear normal. Mood is appropriate for condition and setting          Labs on Admission:  Basic Metabolic Panel: Recent Labs  Lab 04/03/22 1710  NA 136  K 4.3  CL 103  CO2 23  GLUCOSE 151*  BUN 70*  CREATININE 2.71*  CALCIUM 8.3*   Liver Function Tests: Recent  Labs  Lab 04/03/22 1710  AST 14*  ALT 14  ALKPHOS 92  BILITOT 0.9  PROT 6.9  ALBUMIN 3.3*   No results for input(s): "LIPASE", "AMYLASE" in the last 168 hours. No results for input(s): "AMMONIA" in the last 168 hours. CBC: Recent Labs  Lab 04/03/22 1710  WBC 11.8*  NEUTROABS 8.8*  HGB 11.5*  HCT 35.7*  MCV 89.5  PLT 182   Cardiac Enzymes: No results for input(s): "CKTOTAL", "CKMB", "CKMBINDEX", "TROPONINI" in the last 168 hours.  BNP (last 3 results) Recent Labs    04/03/22 1710  BNP 16.0    ProBNP (last 3 results) No results for input(s): "PROBNP" in the last 8760 hours.  CBG: Recent Labs  Lab 04/03/22 1708  GLUCAP 147*    Radiological Exams on Admission: DG Chest Port 1 View  Result Date: 04/03/2022 CLINICAL DATA:  Questionable sepsis.  Evaluate for abnormality. EXAM: PORTABLE CHEST 1 VIEW COMPARISON:  [facility] chest 01/17/2022 FINDINGS: Cardiac silhouette and mediastinal contours are within normal limits. Interval removal of prior right internal jugular central venous catheter. There are again mildly decreased lung volumes with bibasilar bronchovascular crowding. No new focal airspace opacity. No pleural effusion or pneumothorax. Mild multilevel degenerative disc changes of the thoracic spine. IMPRESSION: Low lung volumes.  No definite acute lung process. Electronically Signed   By: Yvonne Kendall M.D.   On: 04/03/2022 17:36    EKG: I independently viewed the EKG done and my findings are as followed: Normal sinus rhythm at rate of 80 bpm  Assessment/Plan Present on Admission:  Dehydration  Essential hypertension  Obesity (BMI 30-39.9)  Principal Problem:   Acute kidney injury superimposed on chronic kidney disease ([place]) Active Problems:   Dehydration   Obesity (BMI 30-39.9)   Essential hypertension   Type 2 diabetes mellitus with hyperglycemia ([facility])   Hypotension   Acute urinary retention   Open thigh wound, right, sequela  Acute kidney injury on CKD  stage IV BUN/Creatinine 70/2.71 (baseline creatinine at 1.0-1.3). Continue IV hydration Renally adjust medications, avoid nephrotoxic agents/dehydration/hypotension  Dehydration Hypotension Continue IV hydration Home BP meds will be held at this time  Essential hypertension Oral BP meds will be held at this time due to soft BP  Urinary  retention Foley catheter was placed in the ED with the drainage of 825 mls of urine Continue to monitor patient for improvement and wean off Foley  Right thigh wound Continue wound care Continue amoxicillin per home regimen  T2DM with hyperglycemia Continue ISS and hypoglycemia protocol  Hypoalbuminemia possibly secondary to mild protein calorie malnutrition Albumin 3.3, protein supplement will be provided  Obesity (BMI 39.48) Diet and lifestyle modification  DVT prophylaxis: Lovenox  Code Status: Full code  Consults: None   Family Communication: None at bedside  Severity of Illness: The appropriate patient status for this patient is OBSERVATION. Observation status is judged to be reasonable and necessary in order to provide the required intensity of service to ensure the patient's safety. The patient's presenting symptoms, physical exam findings, and initial radiographic and laboratory data in the context of their medical condition is felt to place them at decreased risk for further clinical deterioration. Furthermore, it is anticipated that the patient will be medically stable for discharge from the hospital within 2 midnights of admission.   Author: Bernadette Hoit, DO 04/03/2022 11:10 PM  For on call review [email or web address].

## 2022-04-03 NOTE — ED Provider Notes (Signed)
[facility] EMERGENCY DEPARTMENT Provider Note   CSN: [id number] Arrival date & time: 04/03/22  1702     History {Add pertinent medical, surgical, social history, OB history to HPI:1} Chief Complaint  Patient presents with   Weakness    [name] is a 61 y.o. female with history of diabetes presenting to emergency department with concern for syncope.  Patient brought in by EMS reports the patient's been having "vagal spells" for the past several days.  The patient ports she has been having lightheaded spells, particularly when she is on the toilet, including last night.  She felt very dizzy and weak this morning at a hard time getting up and her family member called EMS.  They reported her blood pressure was low (EMS reporting 60/30).  No fluid given by BLS.  Accucheck wnl.    Patient reports she has not been drinking a lot of water at home.  Patient reports typically her blood pressure was "good" but she does take a blood pressure pill and to take all of her medicine this morning.  She reports she is still on antibiotics from a skin infection that she had managed in the hospital.  Per my review of external records she was admitted in June of this year for surgical debridement of the right thigh and abscess of the right upper thigh that was concerning at the time for necrotizing fasciitis.  Patient continues with amoxicillin per infectious disease recommendation, most recently evaluate about 2 weeks ago by infectious disease specialist per televisit.  She is planned for 4 additional weeks of amoxicillin.  HPI     Home Medications Prior to Admission medications   Medication Sig Start Date End Date Taking? Authorizing Provider  acetaminophen (TYLENOL) 325 MG tablet Take 2 tablets (650 mg total) by mouth every 6 (six) hours as needed for moderate pain or fever. 10/09/21   Jaynee Eagles, PA-C  amoxicillin (AMOXIL) 500 MG capsule Take 2 capsules (1,000 mg total) by mouth every 8  (eight) hours. 01/22/22   Geradine Girt, DO  dicyclomine (BENTYL) 20 MG tablet Take 20 mg by mouth 4 (four) times daily as needed for cramping. 01/03/22   [provider]  DULoxetine (CYMBALTA) 60 MG capsule Take 60 mg by mouth 2 (two) times daily.     [provider]  escitalopram (LEXAPRO) 10 MG tablet Take 10 mg by mouth daily. 02/15/21   [provider]  furosemide (LASIX) 40 MG tablet Take 1 tablet (40 mg total) by mouth daily. Start 11/25/2021 11/25/21 11/25/22  Roxan Hockey, MD  gabapentin (NEURONTIN) 600 MG tablet Take 0.5 tablets (300 mg total) by mouth 3 (three) times daily. Patient taking differently: Take 600 mg by mouth in the morning, at noon, in the evening, and at bedtime. 05/18/21   Kathie Dike, MD  gentamicin cream (GARAMYCIN) 0.1 % Apply 1 Application topically 2 (two) times daily. Patient not taking: Reported on 02/09/2022 12/30/21   [provider]  leptospermum manuka honey (MEDIHONEY) PSTE paste Apply 1 Application topically daily. Patient not taking: Reported on 03/23/2022 01/19/22   Geradine Girt, DO  MOUNJARO 5 MG/0.5ML Pen Inject 10 mg into the skin once a week. 11/23/21   [provider]  NOVOLOG RELION 100 UNIT/ML injection Inject 4-20 Units into the skin 3 (three) times daily with meals. Per sliding scale:70-130; 0 units, 130 to 180; 4 units; 180-240; 8 units; 241-300 ; 10 units; 301-350; 12 units; 351-400; 16 units. > 400 20  units and call MD 10/31/21   [provider]  oxyCODONE-acetaminophen (PERCOCET/ROXICET) 5-325 MG tablet Take 1 tablet by mouth every 6 (six) hours as needed for severe pain. Patient not taking: Reported on 03/23/2022 02/06/22   Fredia Sorrow, MD  prazosin (MINIPRESS) 2 MG capsule Take 2 mg by mouth at bedtime. 03/15/21   [provider]      Allergies    Ibuprofen, Red dye, Codeine, and Nitrofurantoin    Review of Systems   Review of Systems  Physical Exam Updated Vital Signs BP  (!) 81/43   Pulse 80   Resp 16   Ht '5\' 4"'$  (1.626 m)   Wt 104.3 kg   SpO2 95%   BMI 39.48 kg/m  Physical Exam Constitutional:      General: She is not in acute distress. HENT:     Head: Normocephalic and atraumatic.  Eyes:     Conjunctiva/sclera: Conjunctivae normal.     Pupils: Pupils are equal, round, and reactive to light.  Cardiovascular:     Rate and Rhythm: Normal rate and regular rhythm.  Pulmonary:     Effort: Pulmonary effort is normal. No respiratory distress.  Abdominal:     General: There is no distension.     Tenderness: There is no abdominal tenderness.  Skin:    General: Skin is warm and dry.     Comments: Ulcerated wound of the right upper medial thigh near the vulva appears to have clean borders, no purulent drainage  Neurological:     General: No focal deficit present.     Mental Status: She is alert. Mental status is at baseline.  Psychiatric:        Mood and Affect: Mood normal.        Behavior: Behavior normal.     ED Results / Procedures / Treatments   Labs (all labs ordered are listed, but only abnormal results are displayed) Labs Reviewed  CBG MONITORING, ED - Abnormal; Notable for the following components:      Result Value   Glucose-Capillary 147 (*)    All other components within normal limits  CULTURE, BLOOD (SINGLE)  SARS CORONAVIRUS 2 BY RT PCR  LACTIC ACID, PLASMA  LACTIC ACID, PLASMA  COMPREHENSIVE METABOLIC PANEL  CBC WITH DIFFERENTIAL/PLATELET  PROTIME-INR  APTT  URINALYSIS, ROUTINE W REFLEX MICROSCOPIC  BRAIN NATRIURETIC PEPTIDE  POC URINE PREG, ED    EKG None  Radiology DG Chest Port 1 View  Result Date: 04/03/2022 CLINICAL DATA:  Questionable sepsis.  Evaluate for abnormality. EXAM: PORTABLE CHEST 1 VIEW COMPARISON:  AP chest 01/17/2022 FINDINGS: Cardiac silhouette and mediastinal contours are within normal limits. Interval removal of prior right internal jugular central venous catheter. There are again mildly decreased  lung volumes with bibasilar bronchovascular crowding. No new focal airspace opacity. No pleural effusion or pneumothorax. Mild multilevel degenerative disc changes of the thoracic spine. IMPRESSION: Low lung volumes.  No definite acute lung process. Electronically Signed   By: Yvonne Kendall M.D.   On: 04/03/2022 17:36    Procedures Procedures  {Document cardiac monitor, telemetry assessment procedure when appropriate:1}  Medications Ordered in ED Medications  sodium chloride 0.9 % bolus 2,000 mL (2,000 mLs Intravenous New Bag/Given 04/03/22 1737)  lactated ringers bolus 1,000 mL (1,000 mLs Intravenous New Bag/Given 04/03/22 1708)    ED Course/ Medical Decision Making/ A&P  Medical Decision Making Amount and/or Complexity of Data Reviewed Labs: ordered. Radiology: ordered. ECG/medicine tests: ordered.   This patient presents to the ED with concern for syncope versus near syncope, low blood pressure. This involves an extensive number of treatment options, and is a complaint that carries with it a high risk of complications and morbidity.  The differential diagnosis includes dehydration versus anemia versus infection versus other  Co-morbidities that complicate the patient evaluation: History of hospitalization 2 to 3 months ago for deep space skin infection  Additional history obtained from EMS  External records from outside source obtained and reviewed including hospital discharge summary and records from June 2023, infectious disease evaluation from 03/23/22  I ordered and personally interpreted labs.  The pertinent results include:  ***  I ordered imaging studies including *** I independently visualized and interpreted imaging which showed *** I agree with the radiologist interpretation  The patient was maintained on a cardiac monitor.  I personally viewed and interpreted the cardiac monitored which showed an underlying rhythm of: ***  Per my  interpretation the patient's ECG shows ***  I ordered medication including ***  for *** I have reviewed the patients home medicines and have made adjustments as needed  Test Considered: ***  I requested consultation with the ***,  and discussed lab and imaging findings as well as pertinent plan - they recommend: ***  After the interventions noted above, I reevaluated the patient and found that they have: {resolved/improved/worsened:23923::"improved"}  Social Determinants of Health:***  Dispostion:  After consideration of the diagnostic results and the patients response to treatment, I feel that the patent would benefit from ***.   {Document critical care time when appropriate:1} {Document review of labs and clinical decision tools ie heart score, Chads2Vasc2 etc:1}  {Document your independent review of radiology images, and any outside records:1} {Document your discussion with family members, caretakers, and with consultants:1} {Document social determinants of health affecting pt's care:1} {Document your decision making why or why not admission, treatments were needed:1} Final Clinical Impression(s) / ED Diagnoses Final diagnoses:  None    Rx / DC Orders ED Discharge Orders     None

## 2022-04-03 NOTE — ED Notes (Signed)
Pt concerned about her body shakes. Edp made aware Pt still unable to provide a urine sample

## 2022-04-03 NOTE — ED Triage Notes (Signed)
Patient had episode of syncope when being moved from stretcher to bed.

## 2022-04-04 DIAGNOSIS — N179 Acute kidney failure, unspecified: Secondary | ICD-10-CM | POA: Diagnosis not present

## 2022-04-04 DIAGNOSIS — N189 Chronic kidney disease, unspecified: Secondary | ICD-10-CM | POA: Diagnosis not present

## 2022-04-04 LAB — CBC
HCT: 32.2 % — ABNORMAL LOW (ref 36.0–46.0)
HCT: 33.4 % — ABNORMAL LOW (ref 36.0–46.0)
Hemoglobin: 10.3 g/dL — ABNORMAL LOW (ref 12.0–15.0)
Hemoglobin: 10.8 g/dL — ABNORMAL LOW (ref 12.0–15.0)
MCH: 28.8 pg (ref 26.0–34.0)
MCH: 29.2 pg (ref 26.0–34.0)
MCHC: 32 g/dL (ref 30.0–36.0)
MCHC: 32.3 g/dL (ref 30.0–36.0)
MCV: 89.9 fL (ref 80.0–100.0)
MCV: 90.3 fL (ref 80.0–100.0)
Platelets: 142 10*3/uL — ABNORMAL LOW (ref 150–400)
Platelets: 151 10*3/uL (ref 150–400)
RBC: 3.58 MIL/uL — ABNORMAL LOW (ref 3.87–5.11)
RBC: 3.7 MIL/uL — ABNORMAL LOW (ref 3.87–5.11)
RDW: 14.9 % (ref 11.5–15.5)
RDW: 15.2 % (ref 11.5–15.5)
WBC: 11.3 10*3/uL — ABNORMAL HIGH (ref 4.0–10.5)
WBC: 12 10*3/uL — ABNORMAL HIGH (ref 4.0–10.5)
nRBC: 0 % (ref 0.0–0.2)
nRBC: 0 % (ref 0.0–0.2)

## 2022-04-04 LAB — MAGNESIUM: Magnesium: 2 mg/dL (ref 1.7–2.4)

## 2022-04-04 LAB — COMPREHENSIVE METABOLIC PANEL
ALT: 12 U/L (ref 0–44)
AST: 11 U/L — ABNORMAL LOW (ref 15–41)
Albumin: 2.6 g/dL — ABNORMAL LOW (ref 3.5–5.0)
Alkaline Phosphatase: 71 U/L (ref 38–126)
Anion gap: 4 — ABNORMAL LOW (ref 5–15)
BUN: 59 mg/dL — ABNORMAL HIGH (ref 6–20)
CO2: 22 mmol/L (ref 22–32)
Calcium: 7.6 mg/dL — ABNORMAL LOW (ref 8.9–10.3)
Chloride: 113 mmol/L — ABNORMAL HIGH (ref 98–111)
Creatinine, Ser: 1.86 mg/dL — ABNORMAL HIGH (ref 0.44–1.00)
GFR, Estimated: 31 mL/min — ABNORMAL LOW (ref >60)
Glucose, Bld: 103 mg/dL — ABNORMAL HIGH (ref 70–99)
Potassium: 3.7 mmol/L (ref 3.5–5.1)
Sodium: 139 mmol/L (ref 135–145)
Total Bilirubin: 0.4 mg/dL (ref 0.3–1.2)
Total Protein: 5.6 g/dL — ABNORMAL LOW (ref 6.5–8.1)

## 2022-04-04 LAB — CREATININE, SERUM
Creatinine, Ser: 2.2 mg/dL — ABNORMAL HIGH (ref 0.44–1.00)
GFR, Estimated: 25 mL/min — ABNORMAL LOW (ref >60)

## 2022-04-04 LAB — GLUCOSE, CAPILLARY
Glucose-Capillary: 101 mg/dL — ABNORMAL HIGH (ref 70–99)
Glucose-Capillary: 116 mg/dL — ABNORMAL HIGH (ref 70–99)
Glucose-Capillary: 94 mg/dL (ref 70–99)
Glucose-Capillary: 121 mg/dL — ABNORMAL HIGH (ref 70–99)

## 2022-04-04 LAB — PHOSPHORUS: Phosphorus: 4.6 mg/dL (ref 2.5–4.6)

## 2022-04-04 MED ORDER — POLYETHYLENE GLYCOL 3350 17 G PO PACK
17.0000 g | PACK | Freq: Every day | ORAL | Status: DC
  Administered 2022-04-04 – 2022-04-05 (×2): 17 g via ORAL
  Filled 2022-04-04 (×2): qty 1

## 2022-04-04 MED ORDER — AMOXICILLIN 250 MG PO CAPS
1000.0000 mg | ORAL_CAPSULE | Freq: Three times a day (TID) | ORAL | Status: DC
  Administered 2022-04-04 – 2022-04-05 (×3): 1000 mg via ORAL
  Filled 2022-04-04 (×3): qty 4

## 2022-04-04 MED ORDER — CARBAMAZEPINE 200 MG PO TABS
200.0000 mg | ORAL_TABLET | Freq: Two times a day (BID) | ORAL | Status: DC
  Administered 2022-04-04 – 2022-04-05 (×3): 200 mg via ORAL
  Filled 2022-04-04 (×3): qty 1

## 2022-04-04 MED ORDER — GABAPENTIN 300 MG PO CAPS
300.0000 mg | ORAL_CAPSULE | Freq: Three times a day (TID) | ORAL | Status: DC
  Administered 2022-04-04 – 2022-04-05 (×3): 300 mg via ORAL
  Filled 2022-04-04 (×3): qty 1

## 2022-04-04 MED ORDER — DULOXETINE HCL 60 MG PO CPEP
60.0000 mg | ORAL_CAPSULE | Freq: Two times a day (BID) | ORAL | Status: DC
  Administered 2022-04-04 – 2022-04-05 (×3): 60 mg via ORAL
  Filled 2022-04-04 (×3): qty 1

## 2022-04-04 MED ORDER — ESCITALOPRAM OXALATE 10 MG PO TABS
10.0000 mg | ORAL_TABLET | Freq: Every day | ORAL | Status: DC
  Administered 2022-04-04 – 2022-04-05 (×2): 10 mg via ORAL
  Filled 2022-04-04 (×2): qty 1

## 2022-04-04 MED ORDER — ENOXAPARIN SODIUM 40 MG/0.4ML IJ SOSY
40.0000 mg | PREFILLED_SYRINGE | INTRAMUSCULAR | Status: DC
  Administered 2022-04-04 – 2022-04-05 (×2): 40 mg via SUBCUTANEOUS
  Filled 2022-04-04 (×2): qty 0.4

## 2022-04-04 MED ORDER — SODIUM CHLORIDE 0.9 % IV SOLN: INTRAVENOUS | Status: AC

## 2022-04-04 NOTE — TOC Progression Note (Signed)
  Transition of Care [facility]) Screening Note   Patient Details  Name: [name] Date of Birth: [birth year]   Transition of Care [facility]) CM/SW Contact:    Shade Flood, LCSW Phone Number: 04/04/2022, 10:55 AM    Received consult for HH/DME needs. There is no PT consult ordered. If PT sees pt and makes HH/DME recommendations, TOC will assist.  Transition of Care Department (TOC) has reviewed patient and no TOC needs have been identified at this time. We will continue to monitor patient advancement through interdisciplinary progression rounds. If new patient transition needs arise, please place a TOC consult.

## 2022-04-04 NOTE — Progress Notes (Signed)
PROGRESS NOTE    DAMONI Boyd  QVZ:[id number] DOB: Sep 26, 1960 DOA: 04/03/2022 PCP: Teressa Senter, FNP   Brief Narrative:    HPI: [name] is a 61 y.o. female with medical history significant of essential hypertension, type 2 diabetes mellitus, CKD stage IV, diabetic neuropathy and obesity who presents to the emergency department via EMS due to concern for syncope.  Syncope appears to be vasovagal and she was admitted with some dehydration and AKI on CKD stage IV.  She was also noted to have some urinary retention leading to Foley catheter placement in the ED.  She also has a chronic right thigh wound that is being followed by ID and she remains on antibiotics for this.  Assessment & Plan:   Principal Problem:   Acute kidney injury superimposed on chronic kidney disease ([place]) Active Problems:   Dehydration   Obesity (BMI 30-39.9)   Essential hypertension   Type 2 diabetes mellitus with hyperglycemia (HCC)   Hypotension   Acute urinary retention   Open thigh wound, right, sequela  Assessment and Plan:   Acute kidney injury on CKD stage Iv-improving Nonoliguric BUN/Creatinine 70/2.71 (baseline creatinine at 1.0-1.3). Continue IV hydration Renally adjust medications, avoid nephrotoxic agents/dehydration/hypotension   Dehydration Hypotension Continue IV hydration Home BP meds will be held at this time   Essential hypertension Oral BP meds will be held at this time due to soft BP   Urinary retention Foley catheter was placed in the ED with the drainage of 825 mls of urine Continue to monitor patient for improvement and wean off Foley today  Constipation MiraLAX ordered   Right thigh wound Continue wound care Continue amoxicillin per home regimen   T2DM with hyperglycemia Continue ISS and hypoglycemia protocol   Hypoalbuminemia possibly secondary to mild protein calorie malnutrition Albumin 3.3, protein supplement will be provided   Obesity (BMI  39.48) Diet and lifestyle modification    DVT prophylaxis: Lovenox Code Status: Full Family Communication: None at bedside Disposition Plan:  Status is: Observation The patient will require care spanning > 2 midnights and should be moved to inpatient because: Need for continued IV fluid.  Consultants:  None  Procedures:  None  Antimicrobials:  Anti-infectives (From admission, onward)    Start     Dose/Rate Route Frequency Ordered Stop   04/04/22 1400  amoxicillin (AMOXIL) capsule 1,000 mg        1,000 mg Oral Every 8 hours 04/04/22 1226         Subjective: Patient seen and evaluated today with no new acute complaints or concerns aside from mild lower abdominal pain that she attributes to not having had a bowel movement recently. No acute concerns or events noted overnight.  Her appetite is improved and she is having breakfast this morning.  Objective: Vitals:   04/03/22 2340 04/04/22 0546 04/04/22 0906 04/04/22 1127  BP:  (!) 127/50 (!) 127/51 109/80  Pulse:  84 81 81  Resp:    17  Temp:  99.1 F (37.3 C) 98.6 F (37 C) 98.7 F (37.1 C)  TempSrc:  Oral Oral Oral  SpO2:  100% 100% 99%  Weight: 113.4 kg     Height: '5\' 4"'$  (1.626 m)       Intake/Output Summary (Last 24 hours) at 04/04/2022 1226 Last data filed at 04/04/2022 1100 Gross per 24 hour  Intake 4460 ml  Output 2575 ml  Net 1885 ml   Filed Weights   04/03/22 1705 04/03/22 2340  Weight: 104.3  kg 113.4 kg    Examination:  General exam: Appears calm and comfortable, obese Respiratory system: Clear to auscultation. Respiratory effort normal. Cardiovascular system: S1 & S2 heard, RRR.  Gastrointestinal system: Abdomen is soft Central nervous system: Alert and awake Extremities: No edema Skin: No significant lesions noted Psychiatry: Flat affect. Foley with clear, yellow urine output    Data Reviewed: I have personally reviewed following labs and imaging studies  CBC: Recent Labs  Lab  04/03/22 1710 04/03/22 2359 04/04/22 0401  WBC 11.8* 12.0* 11.3*  NEUTROABS 8.8*  --   --   HGB 11.5* 10.8* 10.3*  HCT 35.7* 33.4* 32.2*  MCV 89.5 90.3 89.9  PLT 182 151 629*   Basic Metabolic Panel: Recent Labs  Lab 04/03/22 1710 04/03/22 2359 04/04/22 0401  NA 136  --  139  K 4.3  --  3.7  CL 103  --  113*  CO2 23  --  22  GLUCOSE 151*  --  103*  BUN 70*  --  59*  CREATININE 2.71* 2.20* 1.86*  CALCIUM 8.3*  --  7.6*  MG  --   --  2.0  PHOS  --   --  4.6   GFR: Estimated Creatinine Clearance: 40.2 mL/min (A) (by C-G formula based on SCr of 1.86 mg/dL (H)). Liver Function Tests: Recent Labs  Lab 04/03/22 1710 04/04/22 0401  AST 14* 11*  ALT 14 12  ALKPHOS 92 71  BILITOT 0.9 0.4  PROT 6.9 5.6*  ALBUMIN 3.3* 2.6*   No results for input(s): "LIPASE", "AMYLASE" in the last 168 hours. No results for input(s): "AMMONIA" in the last 168 hours. Coagulation Profile: Recent Labs  Lab 04/03/22 1710  INR 1.1   Cardiac Enzymes: No results for input(s): "CKTOTAL", "CKMB", "CKMBINDEX", "TROPONINI" in the last 168 hours. BNP (last 3 results) No results for input(s): "PROBNP" in the last 8760 hours. HbA1C: No results for input(s): "HGBA1C" in the last 72 hours. CBG: Recent Labs  Lab 04/03/22 1708 04/04/22 0759 04/04/22 1111  GLUCAP 147* 101* 116*   Lipid Profile: No results for input(s): "CHOL", "HDL", "LDLCALC", "TRIG", "CHOLHDL", "LDLDIRECT" in the last 72 hours. Thyroid Function Tests: No results for input(s): "TSH", "T4TOTAL", "FREET4", "T3FREE", "THYROIDAB" in the last 72 hours. Anemia Panel: No results for input(s): "VITAMINB12", "FOLATE", "FERRITIN", "TIBC", "IRON", "RETICCTPCT" in the last 72 hours. Sepsis Labs: Recent Labs  Lab 04/03/22 1801  LATICACIDVEN 1.2    Recent Results (from the past 240 hour(s))  SARS Coronavirus 2 by RT PCR (hospital order, performed in [facility] hospital lab) *cepheid single result test* Anterior Nasal Swab      Status: None   Collection Time: 04/03/22  5:22 PM   Specimen: Anterior Nasal Swab  Result Value Ref Range Status   SARS Coronavirus 2 by RT PCR NEGATIVE NEGATIVE Final    Comment: (NOTE) SARS-CoV-2 target nucleic acids are NOT DETECTED.  The SARS-CoV-2 RNA is generally detectable in upper and lower respiratory specimens during the acute phase of infection. The lowest concentration of SARS-CoV-2 viral copies this assay can detect is 250 copies / mL. A negative result does not preclude SARS-CoV-2 infection and should not be used as the sole basis for treatment or other patient management decisions.  A negative result may occur with improper specimen collection / handling, submission of specimen other than nasopharyngeal swab, presence of viral mutation(s) within the areas targeted by this assay, and inadequate number of viral copies (<250 copies / mL). A negative  result must be combined with clinical observations, patient history, and epidemiological information.  Fact Sheet for Patients:   https://www.patel.info/  Fact Sheet for Healthcare Providers: https://hall.com/  This test is not yet approved or  cleared by the [place] FDA and has been authorized for detection and/or diagnosis of SARS-CoV-2 by FDA under an Emergency Use Authorization (EUA).  This EUA will remain in effect (meaning this test can be used) for the duration of the COVID-19 declaration under Section 564(b)(1) of the Act, 21 U.S.C. section 360bbb-3(b)(1), unless the authorization is terminated or revoked sooner.  Performed at [facility], [address] [place], [place] [address]   Blood culture (routine single)     Status: None (Preliminary result)   Collection Time: 04/03/22  6:01 PM   Specimen: BLOOD RIGHT ARM  Result Value Ref Range Status   Specimen Description BLOOD RIGHT ARM  Final   Special Requests   Final    BOTTLES DRAWN AEROBIC AND ANAEROBIC Blood  Culture adequate volume   Culture   Final    NO GROWTH < 24 HOURS Performed at [facility], [address], [place], [place] [address]    Report Status PENDING  Incomplete         Radiology Studies: DG Chest Port 1 View  Result Date: 04/03/2022 CLINICAL DATA:  Questionable sepsis.  Evaluate for abnormality. EXAM: PORTABLE CHEST 1 VIEW COMPARISON:  AP chest 01/17/2022 FINDINGS: Cardiac silhouette and mediastinal contours are within normal limits. Interval removal of prior right internal jugular central venous catheter. There are again mildly decreased lung volumes with bibasilar bronchovascular crowding. No new focal airspace opacity. No pleural effusion or pneumothorax. Mild multilevel degenerative disc changes of the thoracic spine. IMPRESSION: Low lung volumes.  No definite acute lung process. Electronically Signed   By: Yvonne Kendall M.D.   On: 04/03/2022 17:36        Scheduled Meds:  amoxicillin  1,000 mg Oral Q8H   carbamazepine  200 mg Oral BID   DULoxetine  60 mg Oral BID   enoxaparin (LOVENOX) injection  40 mg Subcutaneous Q24H   escitalopram  10 mg Oral Daily   feeding supplement (GLUCERNA SHAKE)  237 mL Oral TID BM   gabapentin  300 mg Oral TID   insulin aspart  0-9 Units Subcutaneous TID WC   polyethylene glycol  17 g Oral Daily   Continuous Infusions:  sodium chloride       LOS: 0 days    Time spent: 35 minutes    Wilder Kurowski [name] Crocker, DO [facility]  If 7PM-7AM, please contact night-coverage [email or web address] 04/04/2022, 12:26 PM

## 2022-04-05 DIAGNOSIS — R338 Other retention of urine: Secondary | ICD-10-CM | POA: Diagnosis not present

## 2022-04-05 DIAGNOSIS — I10 Essential (primary) hypertension: Secondary | ICD-10-CM | POA: Diagnosis not present

## 2022-04-05 DIAGNOSIS — E860 Dehydration: Secondary | ICD-10-CM | POA: Diagnosis not present

## 2022-04-05 DIAGNOSIS — N179 Acute kidney failure, unspecified: Secondary | ICD-10-CM | POA: Diagnosis not present

## 2022-04-05 LAB — CBC
HCT: 33.1 % — ABNORMAL LOW (ref 36.0–46.0)
Hemoglobin: 10.4 g/dL — ABNORMAL LOW (ref 12.0–15.0)
MCH: 28.3 pg (ref 26.0–34.0)
MCHC: 31.4 g/dL (ref 30.0–36.0)
MCV: 89.9 fL (ref 80.0–100.0)
Platelets: 171 10*3/uL (ref 150–400)
RBC: 3.68 MIL/uL — ABNORMAL LOW (ref 3.87–5.11)
RDW: 14.7 % (ref 11.5–15.5)
WBC: 11.5 10*3/uL — ABNORMAL HIGH (ref 4.0–10.5)
nRBC: 0 % (ref 0.0–0.2)

## 2022-04-05 LAB — BASIC METABOLIC PANEL
Anion gap: 2 — ABNORMAL LOW (ref 5–15)
BUN: 38 mg/dL — ABNORMAL HIGH (ref 6–20)
CO2: 23 mmol/L (ref 22–32)
Calcium: 7.9 mg/dL — ABNORMAL LOW (ref 8.9–10.3)
Chloride: 114 mmol/L — ABNORMAL HIGH (ref 98–111)
Creatinine, Ser: 1.31 mg/dL — ABNORMAL HIGH (ref 0.44–1.00)
GFR, Estimated: 47 mL/min — ABNORMAL LOW (ref >60)
Glucose, Bld: 109 mg/dL — ABNORMAL HIGH (ref 70–99)
Potassium: 3.9 mmol/L (ref 3.5–5.1)
Sodium: 139 mmol/L (ref 135–145)

## 2022-04-05 LAB — GLUCOSE, CAPILLARY
Glucose-Capillary: 108 mg/dL — ABNORMAL HIGH (ref 70–99)
Glucose-Capillary: 137 mg/dL — ABNORMAL HIGH (ref 70–99)

## 2022-04-05 LAB — MAGNESIUM: Magnesium: 2.2 mg/dL (ref 1.7–2.4)

## 2022-04-05 MED ORDER — SENNOSIDES-DOCUSATE SODIUM 8.6-50 MG PO TABS
1.0000 | ORAL_TABLET | Freq: Two times a day (BID) | ORAL | Status: DC
  Administered 2022-04-05: 1 via ORAL
  Filled 2022-04-05: qty 1

## 2022-04-05 NOTE — Discharge Summary (Signed)
Physician Discharge Summary  SAIDI SANTACROCE ZOX:[id number] DOB: [birth year] DOA: 04/03/2022  PCP: Teressa Senter, FNP  Admit date: 04/03/2022 Discharge date: 04/05/2022  Admitted From:  Home  Disposition:  Home   Recommendations for Outpatient Follow-up:  Follow up with PCP in 1 weeks Please obtain BMP in 1 week to follow up renal function   Discharge Condition: STABLE   CODE STATUS: FULL DIET: resume prior home diet   Brief Hospitalization Summary: Please see all hospital notes, images, labs for full details of the hospitalization. 61 y.o. female with medical history significant of essential hypertension, type 2 diabetes mellitus, CKD stage IV, diabetic neuropathy and obesity who presents to the emergency department via EMS due to concern for syncope.  Syncope appears to be vasovagal and she was admitted with some dehydration and AKI on CKD stage IV.  She was also noted to have some urinary retention leading to Foley catheter placement in the ED.  She also has a chronic right thigh wound that is being followed by ID and she remains on antibiotics for this.   Assessment and Plan:     Acute kidney injury on CKD stage IV-improved Nonoliguric BUN/Creatinine 70/2.71 (baseline creatinine at 1.0-1.3). Continue IV hydration Renally adjust medications, avoid nephrotoxic agents/dehydration/hypotension   Dehydration Hypotension Treated with IV hydration with good results   Essential hypertension Resume home meds    Urinary retention Foley catheter was placed in the ED with the drainage of 825 mls of urine Foley removed on 9/12 with voiding trial completed and good spontaneous urination   Constipation MiraLAX ordered   Right thigh wound Continue wound care Continue amoxicillin per home regimen   T2DM with hyperglycemia Resume home treatments CBG (last 3)  Recent Labs    04/04/22 1646 04/04/22 2100 04/05/22 0706  GLUCAP 94 121* 108*   Hypoalbuminemia possibly secondary  to mild protein calorie malnutrition Albumin 3.3, protein supplement will be provided   Obesity (BMI 39.48) Diet and lifestyle modification    Discharge Diagnoses:  Principal Problem:   Acute kidney injury superimposed on chronic kidney disease ([place]) Active Problems:   Obesity (BMI 30-39.9)   Essential hypertension   Dehydration   Type 2 diabetes mellitus with hyperglycemia (HCC)   Hypotension   Acute urinary retention   Open thigh wound, right, sequela   Discharge Instructions:  Allergies as of 04/05/2022       Reactions   Ibuprofen Swelling   Red Dye    Other reaction(s): Nervous syncope   Codeine Palpitations   Nitrofurantoin Diarrhea, Nausea And Vomiting        Medication List     STOP taking these medications    leptospermum manuka honey Pste paste       TAKE these medications    acetaminophen 325 MG tablet Commonly known as: Tylenol Take 2 tablets (650 mg total) by mouth every 6 (six) hours as needed for moderate pain or fever.   amoxicillin 500 MG capsule Commonly known as: AMOXIL Take 2 capsules (1,000 mg total) by mouth every 8 (eight) hours.   carbamazepine 200 MG tablet Commonly known as: TEGRETOL Take 200 mg by mouth 2 (two) times daily.   DULoxetine 60 MG capsule Commonly known as: CYMBALTA Take 60 mg by mouth 2 (two) times daily.   enalapril 10 MG tablet Commonly known as: VASOTEC Take 10 mg by mouth daily.   escitalopram 10 MG tablet Commonly known as: LEXAPRO Take 10 mg by mouth daily.   furosemide 40 MG tablet  Commonly known as: Lasix Take 1 tablet (40 mg total) by mouth daily. Start 11/25/2021   gabapentin 600 MG tablet Commonly known as: NEURONTIN Take 0.5 tablets (300 mg total) by mouth 3 (three) times daily. What changed:  how much to take when to take this   Mounjaro 5 MG/0.5ML Pen Generic drug: tirzepatide Inject 10 mg into the skin once a week.   NovoLOG ReliOn 100 UNIT/ML injection Generic drug: insulin  aspart Inject 4-20 Units into the skin 3 (three) times daily with meals. Per sliding scale:70-130; 0 units, 130 to 180; 4 units; 180-240; 8 units; 241-300 ; 10 units; 301-350; 12 units; 351-400; 16 units. > 400 20 units and call MD   prazosin 2 MG capsule Commonly known as: MINIPRESS Take 2 mg by mouth at bedtime.        Follow-up Information     [name], Ander Purpura, FNP. Schedule an appointment as soon as possible for a visit in 1 week(s).   Specialty: Family Medicine Why: Hospital Follow Up Contact information: [address] [place] [address] [phone]                Allergies  Allergen Reactions   Ibuprofen Swelling   Red Dye     Other reaction(s): Nervous syncope   Codeine Palpitations   Nitrofurantoin Diarrhea and Nausea And Vomiting   Allergies as of 04/05/2022       Reactions   Ibuprofen Swelling   Red Dye    Other reaction(s): Nervous syncope   Codeine Palpitations   Nitrofurantoin Diarrhea, Nausea And Vomiting        Medication List     STOP taking these medications    leptospermum manuka honey Pste paste       TAKE these medications    acetaminophen 325 MG tablet Commonly known as: Tylenol Take 2 tablets (650 mg total) by mouth every 6 (six) hours as needed for moderate pain or fever.   amoxicillin 500 MG capsule Commonly known as: AMOXIL Take 2 capsules (1,000 mg total) by mouth every 8 (eight) hours.   carbamazepine 200 MG tablet Commonly known as: TEGRETOL Take 200 mg by mouth 2 (two) times daily.   DULoxetine 60 MG capsule Commonly known as: CYMBALTA Take 60 mg by mouth 2 (two) times daily.   enalapril 10 MG tablet Commonly known as: VASOTEC Take 10 mg by mouth daily.   escitalopram 10 MG tablet Commonly known as: LEXAPRO Take 10 mg by mouth daily.   furosemide 40 MG tablet Commonly known as: Lasix Take 1 tablet (40 mg total) by mouth daily. Start 11/25/2021   gabapentin 600 MG tablet Commonly  known as: NEURONTIN Take 0.5 tablets (300 mg total) by mouth 3 (three) times daily. What changed:  how much to take when to take this   Mounjaro 5 MG/0.5ML Pen Generic drug: tirzepatide Inject 10 mg into the skin once a week.   NovoLOG ReliOn 100 UNIT/ML injection Generic drug: insulin aspart Inject 4-20 Units into the skin 3 (three) times daily with meals. Per sliding scale:70-130; 0 units, 130 to 180; 4 units; 180-240; 8 units; 241-300 ; 10 units; 301-350; 12 units; 351-400; 16 units. > 400 20 units and call MD   prazosin 2 MG capsule Commonly known as: MINIPRESS Take 2 mg by mouth at bedtime.       Procedures/Studies: DG Chest Port 1 View  Result Date: 04/03/2022 CLINICAL DATA:  Questionable sepsis.  Evaluate for abnormality. EXAM:  PORTABLE CHEST 1 VIEW COMPARISON:  AP chest 01/17/2022 FINDINGS: Cardiac silhouette and mediastinal contours are within normal limits. Interval removal of prior right internal jugular central venous catheter. There are again mildly decreased lung volumes with bibasilar bronchovascular crowding. No new focal airspace opacity. No pleural effusion or pneumothorax. Mild multilevel degenerative disc changes of the thoracic spine. IMPRESSION: Low lung volumes.  No definite acute lung process. Electronically Signed   By: Yvonne Kendall M.D.   On: 04/03/2022 17:36     Subjective: Pt reports wanting to go home.  Pt says she is urinating with no difficulty since foley was removed.   Discharge Exam: Vitals:   04/04/22 2057 04/05/22 0312  BP: (!) 130/55 (!) 119/48  Pulse: 80 77  Resp:  20  Temp: 98.6 F (37 C) 98.1 F (36.7 C)  SpO2: 97% 99%   Vitals:   04/04/22 1127 04/04/22 1311 04/04/22 2057 04/05/22 0312  BP: 109/80 (!) 101/49 (!) 130/55 (!) 119/48  Pulse: 81 77 80 77  Resp: '17 20  20  '$ Temp: 98.7 F (37.1 C) 98.5 F (36.9 C) 98.6 F (37 C) 98.1 F (36.7 C)  TempSrc: Oral Oral Oral Oral  SpO2: 99% 99% 97% 99%  Weight:      Height:        General: Pt is alert, awake, not in acute distress Cardiovascular: RRR, S1/S2 +, no rubs, no gallops Respiratory: CTA bilaterally, no wheezing, no rhonchi Abdominal: Soft, NT, ND, bowel sounds + Extremities: no edema, no cyanosis   The results of significant diagnostics from this hospitalization (including imaging, microbiology, ancillary and laboratory) are listed below for reference.     Microbiology: Recent Results (from the past 240 hour(s))  SARS Coronavirus 2 by RT PCR (hospital order, performed in [facility] hospital lab) *cepheid single result test* Anterior Nasal Swab     Status: None   Collection Time: 04/03/22  5:22 PM   Specimen: Anterior Nasal Swab  Result Value Ref Range Status   SARS Coronavirus 2 by RT PCR NEGATIVE NEGATIVE Final    Comment: (NOTE) SARS-CoV-2 target nucleic acids are NOT DETECTED.  The SARS-CoV-2 RNA is generally detectable in upper and lower respiratory specimens during the acute phase of infection. The lowest concentration of SARS-CoV-2 viral copies this assay can detect is 250 copies / mL. A negative result does not preclude SARS-CoV-2 infection and should not be used as the sole basis for treatment or other patient management decisions.  A negative result may occur with improper specimen collection / handling, submission of specimen other than nasopharyngeal swab, presence of viral mutation(s) within the areas targeted by this assay, and inadequate number of viral copies (<250 copies / mL). A negative result must be combined with clinical observations, patient history, and epidemiological information.  Fact Sheet for Patients:   https://www.patel.info/  Fact Sheet for Healthcare Providers: https://hall.com/  This test is not yet approved or  cleared by the [place] FDA and has been authorized for detection and/or diagnosis of SARS-CoV-2 by FDA under an Emergency Use Authorization (EUA).   This EUA will remain in effect (meaning this test can be used) for the duration of the COVID-19 declaration under Section 564(b)(1) of the Act, 21 U.S.C. section 360bbb-3(b)(1), unless the authorization is terminated or revoked sooner.  Performed at [facility], [address] [place], [place] [address]   Blood culture (routine single)     Status: None (Preliminary result)   Collection Time: 04/03/22  6:01 PM  Specimen: BLOOD RIGHT ARM  Result Value Ref Range Status   Specimen Description BLOOD RIGHT ARM  Final   Special Requests   Final    BOTTLES DRAWN AEROBIC AND ANAEROBIC Blood Culture adequate volume   Culture   Final    NO GROWTH 2 DAYS Performed at [facility], [address], [place], [place] [address]    Report Status PENDING  Incomplete     Labs: BNP (last 3 results) Recent Labs    04/03/22 1710  BNP 73.7   Basic Metabolic Panel: Recent Labs  Lab 04/03/22 1710 04/03/22 2359 04/04/22 0401 04/05/22 0337  NA 136  --  139 139  K 4.3  --  3.7 3.9  CL 103  --  113* 114*  CO2 23  --  22 23  GLUCOSE 151*  --  103* 109*  BUN 70*  --  59* 38*  CREATININE 2.71* 2.20* 1.86* 1.31*  CALCIUM 8.3*  --  7.6* 7.9*  MG  --   --  2.0 2.2  PHOS  --   --  4.6  --    Liver Function Tests: Recent Labs  Lab 04/03/22 1710 04/04/22 0401  AST 14* 11*  ALT 14 12  ALKPHOS 92 71  BILITOT 0.9 0.4  PROT 6.9 5.6*  ALBUMIN 3.3* 2.6*   No results for input(s): "LIPASE", "AMYLASE" in the last 168 hours. No results for input(s): "AMMONIA" in the last 168 hours. CBC: Recent Labs  Lab 04/03/22 1710 04/03/22 2359 04/04/22 0401 04/05/22 0337  WBC 11.8* 12.0* 11.3* 11.5*  NEUTROABS 8.8*  --   --   --   HGB 11.5* 10.8* 10.3* 10.4*  HCT 35.7* 33.4* 32.2* 33.1*  MCV 89.5 90.3 89.9 89.9  PLT 182 151 142* 171   Cardiac Enzymes: No results for input(s): "CKTOTAL", "CKMB", "CKMBINDEX", "TROPONINI" in the last 168 hours. BNP: Invalid input(s): "POCBNP" CBG: Recent Labs  Lab  04/04/22 0759 04/04/22 1111 04/04/22 1646 04/04/22 2100 04/05/22 0706  GLUCAP 101* 116* 94 121* 108*   D-Dimer No results for input(s): "DDIMER" in the last 72 hours. Hgb A1c No results for input(s): "HGBA1C" in the last 72 hours. Lipid Profile No results for input(s): "CHOL", "HDL", "LDLCALC", "TRIG", "CHOLHDL", "LDLDIRECT" in the last 72 hours. Thyroid function studies No results for input(s): "TSH", "T4TOTAL", "T3FREE", "THYROIDAB" in the last 72 hours.  Invalid input(s): "FREET3" Anemia work up No results for input(s): "VITAMINB12", "FOLATE", "FERRITIN", "TIBC", "IRON", "RETICCTPCT" in the last 72 hours. Urinalysis    Component Value Date/Time   COLORURINE YELLOW 04/03/2022 2052   APPEARANCEUR HAZY (A) 04/03/2022 2052   LABSPEC 1.008 04/03/2022 2052   PHURINE 5.0 04/03/2022 2052   GLUCOSEU NEGATIVE 04/03/2022 2052   HGBUR SMALL (A) 04/03/2022 2052   BILIRUBINUR NEGATIVE 04/03/2022 2052   BILIRUBINUR negative 10/09/2021 1447   KETONESUR NEGATIVE 04/03/2022 2052   PROTEINUR NEGATIVE 04/03/2022 2052   UROBILINOGEN 0.2 10/09/2021 1447   UROBILINOGEN 0.2 01/29/2011 0831   NITRITE NEGATIVE 04/03/2022 2052   LEUKOCYTESUR TRACE (A) 04/03/2022 2052   Sepsis Labs Recent Labs  Lab 04/03/22 1710 04/03/22 2359 04/04/22 0401 04/05/22 0337  WBC 11.8* 12.0* 11.3* 11.5*   Microbiology Recent Results (from the past 240 hour(s))  SARS Coronavirus 2 by RT PCR (hospital order, performed in [facility] hospital lab) *cepheid single result test* Anterior Nasal Swab     Status: None   Collection Time: 04/03/22  5:22 PM   Specimen: Anterior Nasal Swab  Result Value  Ref Range Status   SARS Coronavirus 2 by RT PCR NEGATIVE NEGATIVE Final    Comment: (NOTE) SARS-CoV-2 target nucleic acids are NOT DETECTED.  The SARS-CoV-2 RNA is generally detectable in upper and lower respiratory specimens during the acute phase of infection. The lowest concentration of SARS-CoV-2 viral copies this  assay can detect is 250 copies / mL. A negative result does not preclude SARS-CoV-2 infection and should not be used as the sole basis for treatment or other patient management decisions.  A negative result may occur with improper specimen collection / handling, submission of specimen other than nasopharyngeal swab, presence of viral mutation(s) within the areas targeted by this assay, and inadequate number of viral copies (<250 copies / mL). A negative result must be combined with clinical observations, patient history, and epidemiological information.  Fact Sheet for Patients:   https://www.patel.info/  Fact Sheet for Healthcare Providers: https://hall.com/  This test is not yet approved or  cleared by the [place] FDA and has been authorized for detection and/or diagnosis of SARS-CoV-2 by FDA under an Emergency Use Authorization (EUA).  This EUA will remain in effect (meaning this test can be used) for the duration of the COVID-19 declaration under Section 564(b)(1) of the Act, 21 U.S.C. section 360bbb-3(b)(1), unless the authorization is terminated or revoked sooner.  Performed at [facility], [address] [place], [place] [address]   Blood culture (routine single)     Status: None (Preliminary result)   Collection Time: 04/03/22  6:01 PM   Specimen: BLOOD RIGHT ARM  Result Value Ref Range Status   Specimen Description BLOOD RIGHT ARM  Final   Special Requests   Final    BOTTLES DRAWN AEROBIC AND ANAEROBIC Blood Culture adequate volume   Culture   Final    NO GROWTH 2 DAYS Performed at [facility], [address], [place], [place] [address]    Report Status PENDING  Incomplete    Time coordinating discharge:  33 mins   SIGNED:  Irwin Brakeman, MD  [facility] 04/05/2022, 10:21 AM How to contact the [facility] Attending or Consulting provider [place] or covering provider during after hours [place], for this patient?   Check the care team in [facility] and look for a) attending/consulting [facility] provider listed and b) the [facility] team listed Log into [email or web address] and use [place]'s universal password to access. If you do not have the password, please contact the hospital operator. Locate the [facility] provider you are looking for under [facility] and page to a number that you can be directly reached. If you still have difficulty reaching the provider, please page the [facility] (Director on Call) for the Hospitalists listed on amion for assistance.

## 2022-04-05 NOTE — Progress Notes (Signed)
IV removed and discharge instructions reviewed.  No new meds.    Daughter has been called for ride home

## 2022-04-05 NOTE — Discharge Instructions (Signed)
IMPORTANT INFORMATION: PAY CLOSE ATTENTION   PHYSICIAN DISCHARGE INSTRUCTIONS  Follow with Primary care provider  [name], Lauren, FNP  and other consultants as instructed by your Hospitalist Physician  [place] IF SYMPTOMS COME BACK, WORSEN OR NEW PROBLEM DEVELOPS   Please note: You were cared for by a hospitalist during your hospital stay. Every effort will be made to forward records to your primary care provider.  You can request that your primary care provider send for your hospital records if they have not received them.  Once you are discharged, your primary care physician will handle any further medical issues. Please note that NO REFILLS for any discharge medications will be authorized once you are discharged, as it is imperative that you return to your primary care physician (or establish a relationship with a primary care physician if you do not have one) for your post hospital discharge needs so that they can reassess your need for medications and monitor your lab values.  Please get a complete blood count and chemistry panel checked by your Primary MD at your next visit, and again as instructed by your Primary MD.  Get Medicines reviewed and adjusted: Please take all your medications with you for your next visit with your Primary MD  Laboratory/radiological data: Please request your Primary MD to go over all hospital tests and procedure/radiological results at the follow up, please ask your primary care provider to get all Hospital records sent to his/her office.  In some cases, they will be blood work, cultures and biopsy results pending at the time of your discharge. Please request that your primary care provider follow up on these results.  If you are diabetic, please bring your blood sugar readings with you to your follow up appointment with primary care.    Please call and make your follow up appointments as soon as possible.    Also  Note the following: If you experience worsening of your admission symptoms, develop shortness of breath, life threatening emergency, suicidal or homicidal thoughts you must seek medical attention immediately by calling 911 or calling your MD immediately  if symptoms less severe.  You must read complete instructions/literature along with all the possible adverse reactions/side effects for all the Medicines you take and that have been prescribed to you. Take any new Medicines after you have completely understood and accpet all the possible adverse reactions/side effects.   Do not drive when taking Pain medications or sleeping medications (Benzodiazepines)  Do not take more than prescribed Pain, Sleep and Anxiety Medications. It is not advisable to combine anxiety,sleep and pain medications without talking with your primary care practitioner  Special Instructions: If you have smoked or chewed Tobacco  in the last 2 yrs please stop smoking, stop any regular Alcohol  and or any Recreational drug use.  Wear Seat belts while driving.  Do not drive if taking any narcotic, mind altering or controlled substances or recreational drugs or alcohol.

## 2022-04-05 NOTE — Care Management Obs Status (Signed)
[place] [facility] NOTIFICATION   Patient Details  Name: [name] MRN: [id number] Date of Birth: 01-05-1961   Medicare Observation Status Notification Given:  Yes    Tommy Medal 04/05/2022, 8:52 AM

## 2022-04-08 LAB — CULTURE, BLOOD (SINGLE)
Culture: NO GROWTH
Special Requests: ADEQUATE

## 2022-04-13 ENCOUNTER — Encounter (HOSPITAL_BASED_OUTPATIENT_CLINIC_OR_DEPARTMENT_OTHER): Payer: Medicare HMO | Admitting: Internal Medicine

## 2022-04-13 DIAGNOSIS — E11622 Type 2 diabetes mellitus with other skin ulcer: Secondary | ICD-10-CM

## 2022-04-13 DIAGNOSIS — S31502A Unspecified open wound of unspecified external genital organs, female, initial encounter: Secondary | ICD-10-CM | POA: Diagnosis not present

## 2022-04-13 DIAGNOSIS — S31819A Unspecified open wound of right buttock, initial encounter: Secondary | ICD-10-CM | POA: Diagnosis not present

## 2022-04-13 DIAGNOSIS — M726 Necrotizing fasciitis: Secondary | ICD-10-CM
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
## 2022-04-14 NOTE — Progress Notes (Signed)
[name], [name] ([id number]) Visit Report for 04/13/2022 Arrival Information Details Patient Name: Date of Service: [name] [name] 04/13/2022 3:15 PM Medical Record Number: [id number] Patient Account Number: [id number] Date of Birth/Sex: Treating RN: 1960/08/03 (61 y.o. [name], Lauren Primary Care Allan Minotti: Teressa Senter Other Clinician: Referring Rashika Bettes: Treating Job Holtsclaw/Extender: Clare Charon in Treatment: 7 Visit Information History Since Last Visit Added or deleted any medications: No Patient Arrived: Wheel Chair Any new allergies or adverse reactions: No Arrival Time: 15:17 Had a fall or experienced change in No Accompanied By: daughter activities of daily living that may affect Transfer Assistance: None risk of falls: Patient Identification Verified: Yes Signs or symptoms of abuse/neglect since last visito No Secondary Verification Process Completed: Yes Hospitalized since last visit: Yes Patient Requires Transmission-Based Precautions: No Implantable device outside of the clinic excluding No Patient Has Alerts: No cellular tissue based products placed in the center since last visit: Has Dressing in Place as Prescribed: Yes Pain Present Now: No Electronic Signature(s) Signed: 04/14/2022 12:30:24 PM By: Erenest Blank Entered By: Erenest Blank on 04/13/2022 15:18:00 -------------------------------------------------------------------------------- Clinic Level of Care Assessment Details Patient Name: Date of Service: [name] 04/13/2022 3:15 PM Medical Record Number: [id number] Patient Account Number: [id number] Date of Birth/Sex: Treating RN: 1960/10/22 (61 y.o. [name], Lauren Primary Care Cha Gomillion: Teressa Senter Other Clinician: Referring Stashia Sia: Treating Shanoah Asbill/Extender: Clare Charon in Treatment: 7 Clinic Level of Care Assessment Items TOOL 4 Quantity Score X-  1 0 Use when only an EandM is performed on FOLLOW-UP visit ASSESSMENTS - Nursing Assessment / Reassessment X- 1 10 Reassessment of Co-morbidities (includes updates in patient status) X- 1 5 Reassessment of Adherence to Treatment Plan ASSESSMENTS - Wound and Skin A ssessment / Reassessment X - Simple Wound Assessment / Reassessment - one wound 1 5 _0  - 0 Complex Wound Assessment / Reassessment - multiple wounds _1  - 0 Dermatologic / Skin Assessment (not related to wound area) ASSESSMENTS - Focused Assessment _2  - 0 Circumferential Edema Measurements - multi extremities _3  - 0 Nutritional Assessment / Counseling / Intervention _4  - 0 Lower Extremity Assessment (monofilament, tuning fork, pulses) _5  - 0 Peripheral Arterial Disease Assessment (using hand held doppler) ASSESSMENTS - Ostomy and/or Continence Assessment and Care _6  - 0 Incontinence Assessment and Management _7  - 0 Ostomy Care Assessment and Management (repouching, etc.) PROCESS - Coordination of Care X - Simple Patient / Family Education for ongoing care 1 15 _8  - 0 Complex (extensive) Patient / Family Education for ongoing care X- 1 10 Staff obtains Programmer, systems, Records, T Results / Process Orders est _9  - 0 Staff telephones HHA, Nursing Homes / Clarify orders / etc _10  - 0 Routine Transfer to another Facility (non-emergent condition) _11  - 0 Routine Hospital Admission (non-emergent condition) _12  - 0 New Admissions / Biomedical engineer / Ordering NPWT Apligraf, etc. , _13  - 0 Emergency Hospital Admission (emergent condition) X- 1 10 Simple Discharge Coordination _14  - 0 Complex (extensive) Discharge Coordination PROCESS - Special Needs _15  - 0 Pediatric / Minor Patient Management _16  - 0 Isolation Patient Management _17  - 0 Hearing / Language / Visual special needs _18  - 0 Assessment of Community assistance (transportation, D/C planning, etc.) _19  - 0 Additional assistance / Altered mentation _20  -  0 Support Surface(s) Assessment (bed, cushion, seat, etc.) INTERVENTIONS - Wound Cleansing / Measurement X - Simple Wound Cleansing - one wound 1 5 _21  - 0 Complex Wound Cleansing -  multiple wounds X- 1 5 Wound Imaging (photographs - any number of wounds) _0  - 0 Wound Tracing (instead of photographs) X- 1 5 Simple Wound Measurement - one wound _1  - 0 Complex Wound Measurement - multiple wounds INTERVENTIONS - Wound Dressings X - Small Wound Dressing one or multiple wounds 1 10 _2  - 0 Medium Wound Dressing one or multiple wounds _3  - 0 Large Wound Dressing one or multiple wounds X- 1 5 Application of Medications - topical <TFTDDUKGURKYHCWC>_3<\/[id number]>_1  - 0 Application of Medications - injection INTERVENTIONS - Miscellaneous _5  - 0 External ear exam _6  - 0 Specimen Collection (cultures, biopsies, blood, body fluids, etc.) _7  - 0 Specimen(s) / Culture(s) sent or taken to Lab for analysis _8  - 0 Patient Transfer (multiple staff / Civil Service fast streamer / Similar devices) _9  - 0 Simple Staple / Suture removal (25 or less) _10  - 0 Complex Staple / Suture removal (26 or more) _11  - 0 Hypo / Hyperglycemic Management (close monitor of Blood Glucose) _12  - 0 Ankle / Brachial Index (ABI) - do not check if billed separately X- 1 5 Vital Signs Has the patient been seen at the hospital within the last three years: Yes Total Score: 90 Level Of Care: New/Established - Level 3 Electronic Signature(s) Signed: 04/14/2022 12:35:09 PM By: Rhae Hammock RN Entered By: Rhae Hammock on 04/13/2022 16:13:43 -------------------------------------------------------------------------------- Encounter Discharge Information Details Patient Name: Date of Service: [name], [name] H G. 04/13/2022 3:15 PM Medical Record Number: [id number] Patient Account Number: [id number] Date of Birth/Sex: Treating RN: 1961-01-24 (61 y.o. [name], Lauren Primary Care Latron Ribas: Teressa Senter Other Clinician: Referring Hollister Wessler: Treating  Moranda Billiot/Extender: Clare Charon in Treatment: 7 Encounter Discharge Information Items Discharge Condition: Stable Ambulatory Status: Wheelchair Discharge Destination: Home Transportation: Private Auto Accompanied By: daughter Schedule Follow-up Appointment: Yes Clinical Summary of Care: Patient Declined Electronic Signature(s) Signed: 04/14/2022 12:35:09 PM By: Rhae Hammock RN Entered By: Rhae Hammock on 04/13/2022 15:49:43 -------------------------------------------------------------------------------- Lower Extremity Assessment Details Patient Name: Date of Service: [name], Danielle Dess Boyd Lavonne Boyd 04/13/2022 3:15 PM Medical Record Number: [id number] Patient Account Number: [id number] Date of Birth/Sex: Treating RN: 01-20-1961 (61 y.o. [name], Lauren Primary Care Ivette Castronova: Teressa Senter Other Clinician: Referring Plumer Mittelstaedt: Treating Jabier Deese/Extender: Clare Charon in Treatment: 7 Electronic Signature(s) Signed: 04/14/2022 12:30:24 PM By: Erenest Blank Signed: 04/14/2022 12:35:09 PM By: Rhae Hammock RN Entered By: Erenest Blank on 04/13/2022 15:18:47 -------------------------------------------------------------------------------- Multi Wound Chart Details Patient Name: Date of Service: [name], Danielle Dess Boyd Lavonne Boyd 04/13/2022 3:15 PM Medical Record Number: [id number] Patient Account Number: [id number] Date of Birth/Sex: Treating RN: 05-10-1961 (61 y.o. [name], Lauren Primary Care Denys Labree: Teressa Senter Other Clinician: Referring Chrislynn Mosely: Treating Daniella Dewberry/Extender: Deitra Mayo, Lauren Weeks in Treatment: 7 Vital Signs Height(in): 64 Capillary Blood Glucose(mg/dl): 167 Weight(lbs): 248 Pulse(bpm): 15 Body Mass Index(BMI): 42.6 Blood Pressure(mmHg): 134/79 Temperature(F): 98 Respiratory Rate(breaths/min): 18 Photos: [N/A:N/A] Right Groin N/A N/A Wound Location: Surgical Injury N/A  N/A Wounding Event: Open Surgical Wound N/A N/A Primary Etiology: Hypertension, Type II Diabetes, N/A N/A Comorbid History: Neuropathy 01/13/2022 N/A N/A Date Acquired: 7 N/A N/A Weeks of Treatment: Open N/A N/A Wound Status: No N/A N/A Wound Recurrence: 6x4.5x0.1 N/A N/A Measurements L x W x D (cm) 21.206 N/A N/A A (cm) : rea 2.121 N/A N/A Volume (cm) : 59.40% N/A N/A % Reduction in Area: 98.50% N/A N/A % Reduction in Volume: Full Thickness Without Exposed N/A N/A Classification: Support Structures Medium N/A N/A Exudate Amount:  Serosanguineous N/A N/A Exudate Type: red, brown N/A N/A Exudate Color: Distinct, outline attached N/A N/A Wound Margin: Large (67-100%) N/A N/A Granulation Amount: Red, Pink N/A N/A Granulation Quality: Small (1-33%) N/A N/A Necrotic Amount: Fat Layer (Subcutaneous Tissue): Yes N/A N/A Exposed Structures: Fascia: No Tendon: No Muscle: No Joint: No Bone: No Medium (34-66%) N/A N/A Epithelialization: Treatment Notes Wound #1 (Groin) Wound Laterality: Right Cleanser Wound Cleanser Discharge Instruction: Cleanse the wound with wound cleanser prior to applying a clean dressing using gauze sponges, not tissue or cotton balls. Peri-Wound Care Skin Prep Discharge Instruction: Use skin prep as directed Topical Primary Dressing AquacelAg Advantage Dressing, 6x6 (in/in) Secondary Dressing Zetuvit Plus Silicone Border Dressing 5x5 (in/in) Discharge Instruction: Apply silicone border over primary dressing as directed. Secured With Compression Wrap Compression Stockings Add-Ons Electronic Signature(s) Signed: 04/14/2022 9:20:47 AM By: Kalman Shan DO Signed: 04/14/2022 12:35:09 PM By: Rhae Hammock RN Entered By: Kalman Shan on 04/13/2022 16:48:04 -------------------------------------------------------------------------------- Multi-Disciplinary Care Plan Details Patient Name: Date of Service: [name], [name] 04/13/2022 3:15 PM Medical Record Number: [id number] Patient Account Number: [id number] Date of Birth/Sex: Treating RN: Apr 01, 1961 (61 y.o. [name], Lauren Primary Care Cleveland Yarbro: Teressa Senter Other Clinician: Referring Talon Regala: Treating Yaw Escoto/Extender: Deitra Mayo, Lauren Weeks in Treatment: 7 Active Inactive Pain, Acute or Chronic Nursing Diagnoses: Pain, acute or chronic: actual or potential Potential alteration in comfort, pain Goals: Patient will verbalize adequate pain control and receive pain control interventions during procedures as needed Date Initiated: 02/17/2022 Target Resolution Date: 04/22/2022 Goal Status: Active Patient/caregiver will verbalize comfort level met Date Initiated: 02/17/2022 Target Resolution Date: 04/22/2022 Goal Status: Active Interventions: Encourage patient to take pain medications as prescribed Provide education on pain management Treatment Activities: Administer pain control measures as ordered : 02/17/2022 Notes: Wound/Skin Impairment Nursing Diagnoses: Knowledge deficit related to ulceration/compromised skin integrity Goals: Patient/caregiver will verbalize understanding of skin care regimen Date Initiated: 02/17/2022 Target Resolution Date: 04/22/2022 Goal Status: Active Interventions: Assess patient/caregiver ability to perform ulcer/skin care regimen upon admission and as needed Assess ulceration(s) every visit Provide education on ulcer and skin care Treatment Activities: Skin care regimen initiated : 02/17/2022 Topical wound management initiated : 02/17/2022 Notes: Electronic Signature(s) Signed: 04/14/2022 12:35:09 PM By: Rhae Hammock RN Entered By: Rhae Hammock on 04/13/2022 15:47:10 -------------------------------------------------------------------------------- Pain Assessment Details Patient Name: Date of Service: [name] Peach Boyd H G. 04/13/2022 3:15 PM Medical Record Number:  [id number] Patient Account Number: [id number] Date of Birth/Sex: Treating RN: October 18, 1960 (61 y.o. [name], Lauren Primary Care Adalynd Donahoe: Teressa Senter Other Clinician: Referring Kortnie Stovall: Treating Hervey Wedig/Extender: Clare Charon in Treatment: 7 Active Problems Location of Pain Severity and Description of Pain Patient Has Paino No Site Locations Pain Management and Medication Current Pain Management: Electronic Signature(s) Signed: 04/14/2022 12:30:24 PM By: Erenest Blank Signed: 04/14/2022 12:35:09 PM By: Rhae Hammock RN Entered By: Erenest Blank on 04/13/2022 15:18:40 -------------------------------------------------------------------------------- Patient/Caregiver Education Details Patient Name: Date of Service: [name] 9/21/2023andnbsp3:15 PM Medical Record Number: [id number] Patient Account Number: [id number] Date of Birth/Gender: Treating RN: [birth year] (61 y.o. Benjaman Lobe Primary Care Physician: Teressa Senter Other Clinician: Referring Physician: Treating Physician/Extender: Clare Charon in Treatment: 7 Education Assessment Education Provided To: Patient Education Topics Provided Pain: Methods: Explain/Verbal Responses: State content correctly Electronic Signature(s) Signed: 04/14/2022 12:35:09 PM By: Rhae Hammock RN Entered By: Rhae Hammock on 04/13/2022 15:47:24 -------------------------------------------------------------------------------- Wound Assessment Details Patient Name: Date of Service: [name], [name]  Lavonne Boyd 04/13/2022 3:15 PM Medical Record Number: [id number] Patient Account Number: [id number] Date of Birth/Sex: Treating RN: 11-26-1960 (61 y.o. [name], Lauren Primary Care Tayquan Gassman: Teressa Senter Other Clinician: Referring Kristelle Cavallaro: Treating Jairy Angulo/Extender: Deitra Mayo, Lauren Weeks in Treatment: 7 Wound  Status Wound Number: 1 Primary Etiology: Open Surgical Wound Wound Location: Right Groin Wound Status: Open Wounding Event: Surgical Injury Comorbid History: Hypertension, Type II Diabetes, Neuropathy Date Acquired: 01/13/2022 Weeks Of Treatment: 7 Clustered Wound: No Photos Wound Measurements Length: (cm) 6 Width: (cm) 4.5 Depth: (cm) 0.1 Area: (cm) 21.206 Volume: (cm) 2.121 % Reduction in Area: 59.4% % Reduction in Volume: 98.5% Epithelialization: Medium (34-66%) Tunneling: No Undermining: No Wound Description Classification: Full Thickness Without Exposed Support Structures Wound Margin: Distinct, outline attached Exudate Amount: Medium Exudate Type: Serosanguineous Exudate Color: red, brown Foul Odor After Cleansing: No Slough/Fibrino Yes Wound Bed Granulation Amount: Large (67-100%) Exposed Structure Granulation Quality: Red, Pink Fascia Exposed: No Necrotic Amount: Small (1-33%) Fat Layer (Subcutaneous Tissue) Exposed: Yes Necrotic Quality: Adherent Slough Tendon Exposed: No Muscle Exposed: No Joint Exposed: No Bone Exposed: No Treatment Notes Wound #1 (Groin) Wound Laterality: Right Cleanser Wound Cleanser Discharge Instruction: Cleanse the wound with wound cleanser prior to applying a clean dressing using gauze sponges, not tissue or cotton balls. Peri-Wound Care Skin Prep Discharge Instruction: Use skin prep as directed Topical Primary Dressing AquacelAg Advantage Dressing, 6x6 (in/in) Secondary Dressing Zetuvit Plus Silicone Border Dressing 5x5 (in/in) Discharge Instruction: Apply silicone border over primary dressing as directed. Secured With Compression Wrap Compression Stockings Environmental education officer) Signed: 04/14/2022 12:30:24 PM By: Erenest Blank Signed: 04/14/2022 12:35:09 PM By: Rhae Hammock RN Entered By: Erenest Blank on 04/13/2022  15:25:15 -------------------------------------------------------------------------------- Vitals Details Patient Name: Date of Service: [name], [name] H G. 04/13/2022 3:15 PM Medical Record Number: [id number] Patient Account Number: [id number] Date of Birth/Sex: Treating RN: 08-02-1960 (61 y.o. [name], Lauren Primary Care Lafawn Lenoir: Teressa Senter Other Clinician: Referring Bryssa Tones: Treating Pearlie Nies/Extender: Deitra Mayo, Lauren Weeks in Treatment: 7 Vital Signs Time Taken: 15:18 Temperature (F): 98 Height (in): 64 Pulse (bpm): 86 Weight (lbs): 248 Respiratory Rate (breaths/min): 18 Body Mass Index (BMI): 42.6 Blood Pressure (mmHg): 134/79 Capillary Blood Glucose (mg/dl): 167 Reference Range: 80 - 120 mg / dl Electronic Signature(s) Signed: 04/14/2022 12:30:24 PM By: Erenest Blank Entered By: Erenest Blank on 04/13/2022 15:18:35

## 2022-04-27 ENCOUNTER — Ambulatory Visit (HOSPITAL_BASED_OUTPATIENT_CLINIC_OR_DEPARTMENT_OTHER): Payer: Medicare HMO | Admitting: Internal Medicine

## 2022-05-01 ENCOUNTER — Encounter (HOSPITAL_BASED_OUTPATIENT_CLINIC_OR_DEPARTMENT_OTHER): Payer: Medicare HMO | Attending: Internal Medicine | Admitting: Internal Medicine

## 2022-05-01 DIAGNOSIS — M726 Necrotizing fasciitis: Secondary | ICD-10-CM

## 2022-05-01 DIAGNOSIS — T8189XA Other complications of procedures, not elsewhere classified, initial encounter: Secondary | ICD-10-CM | POA: Insufficient documentation

## 2022-05-01 DIAGNOSIS — Y838 Other surgical procedures as the cause of abnormal reaction of the patient, or of later complication, without mention of misadventure at the time of the procedure: Secondary | ICD-10-CM | POA: Insufficient documentation

## 2022-05-01 DIAGNOSIS — E11622 Type 2 diabetes mellitus with other skin ulcer: Secondary | ICD-10-CM | POA: Diagnosis not present

## 2022-05-01 DIAGNOSIS — S31819A Unspecified open wound of right buttock, initial encounter: Secondary | ICD-10-CM

## 2022-05-01 DIAGNOSIS — I129 Hypertensive chronic kidney disease with stage 1 through stage 4 chronic kidney disease, or unspecified chronic kidney disease: Secondary | ICD-10-CM | POA: Insufficient documentation

## 2022-05-01 DIAGNOSIS — E1122 Type 2 diabetes mellitus with diabetic chronic kidney disease: Secondary | ICD-10-CM | POA: Insufficient documentation

## 2022-05-01 DIAGNOSIS — X58XXXA Exposure to other specified factors, initial encounter: Secondary | ICD-10-CM | POA: Insufficient documentation

## 2022-05-01 DIAGNOSIS — S31502A Unspecified open wound of unspecified external genital organs, female, initial encounter: Secondary | ICD-10-CM

## 2022-05-01 DIAGNOSIS — N183 Chronic kidney disease, stage 3 unspecified: Secondary | ICD-10-CM | POA: Insufficient documentation

## 2022-05-01 NOTE — Progress Notes (Signed)
[name], [name] ([id number]) [id number] Page 1 of 7 Visit Report for 03/30/2022 Arrival Information Details Patient Name: Date of Service: [name] 03/30/2022 3:15 PM Medical Record Number: [id number] Patient Account Number: [id number] Date of Birth/Sex: Treating RN: [birth year] (61 y.o. [name], [name] Primary Care [name]: [name] Other Clinician: Referring [name]: Treating [name]/Extender: [name] in Boyd: 5 Visit Information History Since Last Visit Added or deleted any medications: No Patient Arrived: Wheel Chair Any new allergies or adverse reactions: No Arrival Time: 15:28 Had a fall or experienced change in No Accompanied By: DAUGHTER activities of daily living that may affect Transfer Assistance: None risk of falls: Patient Identification Verified: Yes Signs or symptoms of abuse/neglect since last visito No Secondary Verification Process Completed: Yes Hospitalized since last visit: No Patient Requires Transmission-Based Precautions: No Implantable device outside of the clinic excluding No Patient Has Alerts: No cellular tissue based products placed in the center since last visit: Has Dressing in Place as Prescribed: Yes Pain Present Now: No Electronic Signature(s) Signed: 05/01/2022 11:35:58 AM By: [name] Hammock RN Entered By: [name] on 03/30/2022 15:29:52 -------------------------------------------------------------------------------- Clinic Level of Care Assessment Details Patient Name: Date of Service: [name] 03/30/2022 3:15 PM Medical Record Number: [id number] Patient Account Number: [id number] Date of Birth/Sex: Treating RN: [birth year] (61 y.o. [name], [name] Primary Care Anton Cheramie: [name] Other Clinician: Referring [name]: Treating [name]/Extender: [name] in Boyd: 5 Clinic  Level of Care Assessment Items TOOL 4 Quantity Score X- 1 0 Use when only an EandM is performed on FOLLOW-UP visit ASSESSMENTS - Nursing Assessment / Reassessment X- 1 10 Reassessment of Co-morbidities (includes updates in patient status) X- 1 5 Reassessment of Adherence to Boyd Plan ASSESSMENTS - Wound and Skin A ssessment / Reassessment [] - 0 Simple Wound Assessment / Reassessment - one wound [] - 0 Complex Wound Assessment / Reassessment - multiple wounds [] - 0 Dermatologic / Skin Assessment (not related to wound area) ASSESSMENTS - Focused Assessment [] - 0 Circumferential Edema Measurements - multi extremities [] - 0 Nutritional Assessment / Counseling / Intervention [name], [name] ([id number]) [id number] Page 2 of 7 [] - 0 Lower Extremity Assessment (monofilament, tuning fork, pulses) [] - 0 Peripheral Arterial Disease Assessment (using hand held doppler) ASSESSMENTS - Ostomy and/or Continence Assessment and Care [] - 0 Incontinence Assessment and Management [] - 0 Ostomy Care Assessment and Management (repouching, etc.) PROCESS - Coordination of Care X - Simple Patient / Family Education for ongoing care 1 15 [] - 0 Complex (extensive) Patient / Family Education for ongoing care X- 1 10 Staff obtains Programmer, systems, Records, T Results / Process Orders est X- 1 10 Staff telephones HHA, Nursing Homes / Clarify orders / etc [] - 0 Routine Transfer to another Facility (non-emergent condition) [] - 0 Routine Hospital Admission (non-emergent condition) [] - 0 New Admissions / Biomedical engineer / Ordering NPWT Apligraf, etc. , [] - 0 Emergency Hospital Admission (emergent condition) X- 1 10 Simple Discharge Coordination [] - 0 Complex (extensive) Discharge Coordination PROCESS - Special Needs [] - 0 Pediatric / Minor Patient Management [] - 0 Isolation Patient Management [] - 0 Hearing / Language / Visual special  needs [] - 0 Assessment of Community assistance (transportation, D/C planning, etc.) [] - 0 Additional assistance / Altered mentation [] - 0 Support Surface(s) Assessment (bed, cushion, seat, etc.) INTERVENTIONS - Wound Cleansing / Measurement X -  Simple Wound Cleansing - one wound 1 5 [] - 0 Complex Wound Cleansing - multiple wounds X- 1 5 Wound Imaging (photographs - any number of wounds) [] - 0 Wound Tracing (instead of photographs) X- 1 5 Simple Wound Measurement - one wound [] - 0 Complex Wound Measurement - multiple wounds INTERVENTIONS - Wound Dressings X - Small Wound Dressing one or multiple wounds 1 10 [] - 0 Medium Wound Dressing one or multiple wounds [] - 0 Large Wound Dressing one or multiple wounds X- 1 5 Application of Medications - topical [] - 0 Application of Medications - injection INTERVENTIONS - Miscellaneous [] - 0 External ear exam [] - 0 Specimen Collection (cultures, biopsies, blood, body fluids, etc.) [] - 0 Specimen(s) / Culture(s) sent or taken to Lab for analysis [] - 0 Patient Transfer (multiple staff / Civil Service fast streamer / Similar devices) [] - 0 Simple Staple / Suture removal (25 or less) [] - 0 Complex Staple / Suture removal (26 or more) [] - 0 Hypo / Hyperglycemic Management (close monitor of Blood Glucose) [name], [name] ([id number]) [id number] Page 3 of 7 [] - 0 Ankle / Brachial Index (ABI) - do not check if billed separately X- 1 5 Vital Signs Has the patient been seen at the hospital within the last three years: Yes Total Score: 95 Level Of Care: New/Established - Level 3 Electronic Signature(s) Signed: 05/01/2022 11:35:58 AM By: [name] Hammock RN Entered By: [name] on 03/30/2022 16:38:15 -------------------------------------------------------------------------------- Lower Extremity Assessment Details Patient Name: Date of Service: [name] [name], [name]. 03/30/2022 3:15 PM Medical  Record Number: [id number] Patient Account Number: [id number] Date of Birth/Sex: Treating RN: [birth year] (61 y.o. [name], [name] Primary Care Provider: Teressa Boyd Other Clinician: Referring Provider: Treating Provider/Extender: [name] in Boyd: 5 Electronic Signature(s) Signed: 05/01/2022 11:35:58 AM By: [name] Hammock RN Entered By: [name] on 03/30/2022 15:30:29 -------------------------------------------------------------------------------- Multi Wound Chart Details Patient Name: Date of Service: [name] [name], [name] 03/30/2022 3:15 PM Medical Record Number: [id number] Patient Account Number: [id number] Date of Birth/Sex: Treating RN: [birth year] (61 y.o. [name], [name] Primary Care Provider: Teressa Boyd Other Clinician: Referring Provider: Treating Provider/Extender: [name], [name]: 5 Vital Signs Height(in): 64 Capillary Blood Glucose(mg/dl): 198 Weight(lbs): 248 Pulse(bpm): 74 Body Mass Index(BMI): 42.6 Blood Pressure(mmHg): 119/77 Temperature(F): 98.7 Respiratory Rate(breaths/min): 17 [1:Photos:] [N/A:N/A] Right Groin N/A N/A Wound Location: Surgical Injury N/A N/A Wounding Event: Open Surgical Wound N/A N/A Primary Etiology: Hypertension, Type II Diabetes, N/A N/A Comorbid History: Neuropathy 01/13/2022 N/A N/A Date Acquired: 5 N/A N/A Weeks of Boyd: Open N/A N/A Wound StatusJADELYNN, BOYLAN ([id number]) [id number] Page 4 of 7 No N/A N/A Wound Recurrence: 7x5x0.3 N/A N/A Measurements L x W x D (cm) 27.489 N/A N/A A (cm) : rea 8.247 N/A N/A Volume (cm) : 47.40% N/A N/A % Reduction in Area: 94.20% N/A N/A % Reduction in Volume: Full Thickness Without Exposed N/A N/A Classification: Support Structures Medium N/A N/A Exudate Amount: Serosanguineous N/A N/A Exudate Type: red, brown N/A N/A Exudate  Color: Distinct, outline attached N/A N/A Wound Margin: Large (67-100%) N/A N/A Granulation Amount: Red, Pink N/A N/A Granulation Quality: Small (1-33%) N/A N/A Necrotic Amount: Fat Layer (Subcutaneous Tissue): Yes N/A N/A Exposed Structures: Fascia: No Tendon: No Muscle: No Joint: No Bone: No Medium (34-66%) N/A N/A Epithelialization: Boyd Notes Electronic Signature(s) Signed: 03/30/2022 4:12:25 PM By: Kalman Shan DO Signed: 05/01/2022 11:35:58 AM By:  [name] Hammock RN Entered By: Kalman Shan on 03/30/2022 16:10:20 -------------------------------------------------------------------------------- Multi-Disciplinary Care Plan Details Patient Name: Date of Service: [name] 03/30/2022 3:15 PM Medical Record Number: [id number] Patient Account Number: [id number] Date of Birth/Sex: Treating RN: [birth year] (61 y.o. [name], [name] Primary Care Hazell Siwik: [name] Other Clinician: Referring Tanvir Hipple: Treating Odie Edmonds/Extender: [name] in Boyd: 5 Active Inactive Pain, Acute or Chronic Nursing Diagnoses: Pain, acute or chronic: actual or potential Potential alteration in comfort, pain Goals: Patient will verbalize adequate pain control and receive pain control interventions during procedures as needed Date Initiated: 02/17/2022 Target Resolution Date: 03/31/2022 Goal Status: Active Patient/caregiver will verbalize comfort level met Date Initiated: 02/17/2022 Target Resolution Date: 04/01/2022 Goal Status: Active Interventions: Encourage patient to take pain medications as prescribed Provide education on pain management Boyd Activities: Administer pain control measures as ordered : 02/17/2022 Notes: Wound/Skin Impairment Nursing Diagnoses: Knowledge deficit related to ulceration/compromised skin integrity [name], Boyd ([id number]) [id number] Page 5 of  7 Goals: Patient/caregiver will verbalize understanding of skin care regimen Date Initiated: 02/17/2022 Target Resolution Date: 04/07/2022 Goal Status: Active Interventions: Assess patient/caregiver ability to perform ulcer/skin care regimen upon admission and as needed Assess ulceration(s) every visit Provide education on ulcer and skin care Boyd Activities: Skin care regimen initiated : 02/17/2022 Topical wound management initiated : 02/17/2022 Notes: Electronic Signature(s) Signed: 05/01/2022 11:35:58 AM By: [name] Hammock RN Entered By: [name] on 03/30/2022 15:42:44 -------------------------------------------------------------------------------- Pain Assessment Details Patient Name: Date of Service: [name] [name], [name] Dess RA H Boyd. 03/30/2022 3:15 PM Medical Record Number: [id number] Patient Account Number: [id number] Date of Birth/Sex: Treating RN: [birth year] Boyd (61 y.o. [name], [name] Primary Care Rheya Minogue: [name] Other Clinician: Referring Janna Oak: Treating [name] in Boyd: 5 Active Problems Location of Pain Severity and Description of Pain Patient Has Paino No Site Locations Pain Management and Medication Current Pain Management: Electronic Signature(s) Signed: 05/01/2022 11:35:58 AM By: [name] Hammock RN Entered By: [name] on 03/30/2022 15:30:24 [name] ([id number]) [id number] Page 6 of 7 -------------------------------------------------------------------------------- Patient/Caregiver Education Details Patient Name: Date of Service: [name] 9/7/2023andnbsp3:15 PM Medical Record Number: [id number] Patient Account Number: [id number] Date of Birth/Gender: Treating RN: [birth year] (61 y.o. [name] Primary Care Physician: [name] Other Clinician: Referring Physician: Treating Physician/Extender: [name] in Boyd: 5 Education Assessment Education Provided To: Patient Education Topics Provided Wound/Skin Impairment: Methods: Explain/Verbal Responses: Reinforcements needed, State content correctly Electronic Signature(s) Signed: 05/01/2022 11:35:58 AM By: [name] Hammock RN Entered By: [name] on 03/30/2022 15:42:57 -------------------------------------------------------------------------------- Wound Assessment Details Patient Name: Date of Service: [name] [name], [name] RA H Boyd. 03/30/2022 3:15 PM Medical Record Number: [id number] Patient Account Number: [id number] Date of Birth/Sex: Treating RN: [birth year] Boyd (61 y.o. [name], [name] Primary Care Akeisha Lagerquist: [name] Other Clinician: Referring Daysean Tinkham: Treating Natalya Domzalski/Extender: [name], [name]: 5 Wound Status Wound Number: 1 Primary Etiology: Open Surgical Wound Wound Location: Right Groin Wound Status: Open Wounding Event: Surgical Injury Comorbid History: Hypertension, Type II Diabetes, Neuropathy Date Acquired: 01/13/2022 Weeks Of Boyd: 5 Clustered Wound: No Photos Wound Measurements Length: (cm) 7 Width: (cm) 5 [name], [name] ([id number]) Depth: (cm) 0.3 Area: (cm) 27.489 Volume: (cm) 8.247 % Reduction in Area: 47.4% % Reduction in Volume: 94.2% [id number] Page 7 of 7 Epithelialization: Medium (34-66%) Tunneling: No Undermining: No Wound Description Classification: Full Thickness Without Exposed Support Wound Margin: Distinct, outline attached Exudate  Amount: Medium Exudate Type: Serosanguineous Exudate Color: red, brown Structures Foul Odor After Cleansing: No Slough/Fibrino Yes Wound Bed Granulation Amount: Large (67-100%) Exposed Structure Granulation Quality: Red, Pink Fascia Exposed: No Necrotic Amount: Small (1-33%) Fat Layer (Subcutaneous Tissue) Exposed: Yes Necrotic  Quality: Adherent Slough Tendon Exposed: No Muscle Exposed: No Joint Exposed: No Bone Exposed: No Electronic Signature(s) Signed: 03/31/2022 12:32:37 PM By: Erenest Blank Signed: 05/01/2022 11:35:58 AM By: [name] Hammock RN Entered By: Erenest Blank on 03/30/2022 15:38:14 -------------------------------------------------------------------------------- Vitals Details Patient Name: Date of Service: [name] [name], [name] RA H Boyd. 03/30/2022 3:15 PM Medical Record Number: [id number] Patient Account Number: [id number] Date of Birth/Sex: Treating RN: [birth year] (61 y.o. [name], [name] Primary Care Onyx Schirmer: [name] Other Clinician: Referring Devansh Riese: Treating Anne Se[place]/Extender: [name], [name]: 5 Vital Signs Time Taken: 15:29 Temperature (F): 98.7 Height (in): 64 Pulse (bpm): 74 Weight (lbs): 248 Respiratory Rate (breaths/min): 17 Body Mass Index (BMI): 42.6 Blood Pressure (mmHg): 119/77 Capillary Blood Glucose (mg/dl): 198 Reference Range: 80 - 120 mg / dl Electronic Signature(s) Signed: 05/01/2022 11:35:58 AM By: [name] Hammock RN Entered By: [name] on 03/30/2022 15:30:18
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
## 2022-05-04 NOTE — Progress Notes (Signed)
TRYSTAN, EADS ([id number]) [id number] Page 1 of 8 Visit Report for 05/01/2022 Arrival Information Details Patient Name: Date of Service: [name] 05/01/2022 9:15 A M Medical Record Number: [id number] Patient Account Number: [id number] Date of Birth/Sex: Treating RN: 12-Aug-1960 (61 y.o. [name], Lauren Primary Care Marquavious Nazar: Teressa Senter Other Clinician: Referring Idalia Allbritton: Treating Ziyan Hillmer/Extender: Clare Charon in Treatment: 10 Visit Information History Since Last Visit Added or deleted any medications: No Patient Arrived: Wheel Chair Any new allergies or adverse reactions: No Arrival Time: 09:24 Had a fall or experienced change in No Accompanied By: daughter activities of daily living that may affect Transfer Assistance: None risk of falls: Patient Identification Verified: Yes Signs or symptoms of abuse/neglect since last visito No Secondary Verification Process Completed: Yes Hospitalized since last visit: No Patient Requires Transmission-Based Precautions: No Implantable device outside of the clinic excluding No Patient Has Alerts: No cellular tissue based products placed in the center since last visit: Has Dressing in Place as Prescribed: Yes Pain Present Now: No Electronic Signature(s) Signed: 05/04/2022 4:29:25 PM By: Rhae Hammock RN Entered By: Rhae Hammock on 05/01/2022 09:26:04 -------------------------------------------------------------------------------- Clinic Level of Care Assessment Details Patient Name: Date of Service: [name], [name] 05/01/2022 9:15 A M Medical Record Number: [id number] Patient Account Number: [id number] Date of Birth/Sex: Treating RN: Sep 13, 1960 (61 y.o. [name], Lauren Primary Care Kristy Catoe: Teressa Senter Other Clinician: Referring Roberts Bon: Treating Doyne Micke/Extender: Clare Charon in Treatment:  10 Clinic Level of Care Assessment Items TOOL 4 Quantity Score X- 1 0 Use when only an EandM is performed on FOLLOW-UP visit ASSESSMENTS - Nursing Assessment / Reassessment X- 1 10 Reassessment of Co-morbidities (includes updates in patient status) X- 1 5 Reassessment of Adherence to Treatment Plan ASSESSMENTS - Wound and Skin A ssessment / Reassessment X - Simple Wound Assessment / Reassessment - one wound 1 5 []  - 0 Complex Wound Assessment / Reassessment - multiple wounds []  - 0 Dermatologic / Skin Assessment (not related to wound area) ASSESSMENTS - Focused Assessment []  - 0 Circumferential Edema Measurements - multi extremities []  - 0 Nutritional Assessment / Counseling / Intervention MISAKI, SOZIO ([id number]) [id number] Page 2 of 8 []  - 0 Lower Extremity Assessment (monofilament, tuning fork, pulses) []  - 0 Peripheral Arterial Disease Assessment (using hand held doppler) ASSESSMENTS - Ostomy and/or Continence Assessment and Care []  - 0 Incontinence Assessment and Management []  - 0 Ostomy Care Assessment and Management (repouching, etc.) PROCESS - Coordination of Care X - Simple Patient / Family Education for ongoing care 1 15 []  - 0 Complex (extensive) Patient / Family Education for ongoing care X- 1 10 Staff obtains Programmer, systems, Records, T Results / Process Orders est []  - 0 Staff telephones HHA, Nursing Homes / Clarify orders / etc []  - 0 Routine Transfer to another Facility (non-emergent condition) []  - 0 Routine Hospital Admission (non-emergent condition) []  - 0 New Admissions / Biomedical engineer / Ordering NPWT Apligraf, etc. , []  - 0 Emergency Hospital Admission (emergent condition) X- 1 10 Simple Discharge Coordination []  - 0 Complex (extensive) Discharge Coordination PROCESS - Special Needs []  - 0 Pediatric / Minor Patient Management []  - 0 Isolation Patient Management []  - 0 Hearing / Language / Visual  special needs []  - 0 Assessment of Community assistance (transportation, D/C planning, etc.) []  - 0 Additional assistance / Altered mentation []  - 0 Support Surface(s) Assessment (bed, cushion, seat, etc.) INTERVENTIONS - Wound Cleansing /  Measurement X - Simple Wound Cleansing - one wound 1 5 $R'[]'zA$  - 0 Complex Wound Cleansing - multiple wounds X- 1 5 Wound Imaging (photographs - any number of wounds) $RemoveBe'[]'ZcnZKCZBF$  - 0 Wound Tracing (instead of photographs) X- 1 5 Simple Wound Measurement - one wound $RemoveB'[]'IBMkdKSq$  - 0 Complex Wound Measurement - multiple wounds INTERVENTIONS - Wound Dressings X - Small Wound Dressing one or multiple wounds 1 10 $Re'[]'ldu$  - 0 Medium Wound Dressing one or multiple wounds $RemoveBeforeD'[]'bcZyAsnijmqQoz$  - 0 Large Wound Dressing one or multiple wounds $RemoveBeforeD'[]'xcZUJXHESixsvw$  - 0 Application of Medications - topical $RemoveB'[]'TPcYpGcw$  - 0 Application of Medications - injection INTERVENTIONS - Miscellaneous $RemoveBeforeD'[]'iDusbnClGOcAse$  - 0 External ear exam $Remove'[]'HGbZgdd$  - 0 Specimen Collection (cultures, biopsies, blood, body fluids, etc.) $RemoveBefor'[]'NCPVCHVgLVtR$  - 0 Specimen(s) / Culture(s) sent or taken to Lab for analysis $RemoveBefo'[]'ebdMrcoFWVH$  - 0 Patient Transfer (multiple staff / Civil Service fast streamer / Similar devices) $RemoveBeforeDE'[]'neMcQNbfrDntAdT$  - 0 Simple Staple / Suture removal (25 or less) $Remove'[]'AEtRRPC$  - 0 Complex Staple / Suture removal (26 or more) $Remove'[]'EIPeNti$  - 0 Hypo / Hyperglycemic Management (close monitor of Blood Glucose) Granier, Trixie Deis ([id number]) [id number] Page 3 of 8 $Re'[]'NaE$  - 0 Ankle / Brachial Index (ABI) - do not check if billed separately X- 1 5 Vital Signs Has the patient been seen at the hospital within the last three years: Yes Total Score: 85 Level Of Care: New/Established - Level 3 Electronic Signature(s) Signed: 05/04/2022 4:29:25 PM By: Rhae Hammock RN Entered By: Rhae Hammock on 05/01/2022 09:49:10 -------------------------------------------------------------------------------- Encounter Discharge Information Details Patient Name: Date of Service: [name], DEBO RA H G. 05/01/2022  9:15 A M Medical Record Number: [id number] Patient Account Number: [id number] Date of Birth/Sex: Treating RN: 1960-09-28 (61 y.o. [name], Lauren Primary Care Cy Bresee: Teressa Senter Other Clinician: Referring Khalif Stender: Treating Aevah Stansbery/Extender: Clare Charon in Treatment: 10 Encounter Discharge Information Items Discharge Condition: Stable Ambulatory Status: Wheelchair Discharge Destination: Home Transportation: Private Auto Accompanied By: daughter Schedule Follow-up Appointment: Yes Clinical Summary of Care: Patient Declined Electronic Signature(s) Signed: 05/04/2022 4:29:25 PM By: Rhae Hammock RN Entered By: Rhae Hammock on 05/01/2022 09:50:36 -------------------------------------------------------------------------------- Lower Extremity Assessment Details Patient Name: Date of Service: [name], [name] 05/01/2022 9:15 A M Medical Record Number: [id number] Patient Account Number: [id number] Date of Birth/Sex: Treating RN: 11/15/1960 (61 y.o. [name], Lauren Primary Care Raiden Haydu: Teressa Senter Other Clinician: Referring Lee-Ann Gal: Treating Dorreen Valiente/Extender: Clare Charon in Treatment: 10 Electronic Signature(s) Signed: 05/04/2022 4:29:25 PM By: Rhae Hammock RN Entered By: Rhae Hammock on 05/01/2022 09:28:15 -------------------------------------------------------------------------------- Multi Wound Chart Details Patient Name: Date of Service: [name], DEBO RA H G. 05/01/2022 9:15 A M Medical Record Number: [id number] Patient Account Number: [id number] GRACIELLA, ARMENT ([id number]) [id number] Page 4 of 8 Date of Birth/Sex: Treating RN: [birth year] (61 y.o. F) Primary Care Luva Metzger: Teressa Senter Other Clinician: Referring Marcellis Frampton: Treating Dianara Smullen/Extender: Deitra Mayo, Lauren Weeks in Treatment: 10 Vital Signs Height(in):  64 Capillary Blood Glucose(mg/dl): 117 Weight(lbs): 248 Pulse(bpm): 51 Body Mass Index(BMI): 42.6 Blood Pressure(mmHg): 131/65 Temperature(F): 98.2 Respiratory Rate(breaths/min): 17 [1:Photos:] [N/A:N/A] Right Groin N/A N/A Wound Location: Surgical Injury N/A N/A Wounding Event: Open Surgical Wound N/A N/A Primary Etiology: Hypertension, Type II Diabetes, N/A N/A Comorbid History: Neuropathy 01/13/2022 N/A N/A Date Acquired: 10 N/A N/A Weeks of Treatment: Open N/A N/A Wound Status: No N/A N/A Wound Recurrence: 2x1x0.1 N/A N/A Measurements L x W x D (cm) 1.571 N/A N/A A (cm) : rea 0.157 N/A N/A Volume (  cm) : 97.00% N/A N/A % Reduction in Area: 99.90% N/A N/A % Reduction in Volume: Full Thickness Without Exposed N/A N/A Classification: Support Structures Medium N/A N/A Exudate Amount: Serosanguineous N/A N/A Exudate Type: red, brown N/A N/A Exudate Color: Distinct, outline attached N/A N/A Wound Margin: Large (67-100%) N/A N/A Granulation Amount: Red, Pink N/A N/A Granulation Quality: Small (1-33%) N/A N/A Necrotic Amount: Fat Layer (Subcutaneous Tissue): Yes N/A N/A Exposed Structures: Fascia: No Tendon: No Muscle: No Joint: No Bone: No Medium (34-66%) N/A N/A Epithelialization: Treatment Notes Electronic Signature(s) Signed: 05/01/2022 12:26:27 PM By: Kalman Shan DO Entered By: Kalman Shan on 05/01/2022 09:39:53 -------------------------------------------------------------------------------- Multi-Disciplinary Care Plan Details Patient Name: Date of Service: Burgess Amor, DEBO RA H G. 05/01/2022 9:15 A M Medical Record Number: [id number] Patient Account Number: [id number] Date of Birth/Sex: Treating RN: 1960/09/05 (61 y.o. [name], Lauren Primary Care Mahamadou Weltz: Teressa Senter Other Clinician: Referring Fredrik Mogel: Treating Jayr Lupercio/Extender: Clare Charon in Treatment: [address] Trixie Deis  ([id number]) [id number] Page 5 of 8 Active Inactive Pain, Acute or Chronic Nursing Diagnoses: Pain, acute or chronic: actual or potential Potential alteration in comfort, pain Goals: Patient will verbalize adequate pain control and receive pain control interventions during procedures as needed Date Initiated: 02/17/2022 Target Resolution Date: 04/22/2022 Goal Status: Active Patient/caregiver will verbalize comfort level met Date Initiated: 02/17/2022 Target Resolution Date: 04/22/2022 Goal Status: Active Interventions: Encourage patient to take pain medications as prescribed Provide education on pain management Treatment Activities: Administer pain control measures as ordered : 02/17/2022 Notes: Wound/Skin Impairment Nursing Diagnoses: Knowledge deficit related to ulceration/compromised skin integrity Goals: Patient/caregiver will verbalize understanding of skin care regimen Date Initiated: 02/17/2022 Target Resolution Date: 04/22/2022 Goal Status: Active Interventions: Assess patient/caregiver ability to perform ulcer/skin care regimen upon admission and as needed Assess ulceration(s) every visit Provide education on ulcer and skin care Treatment Activities: Skin care regimen initiated : 02/17/2022 Topical wound management initiated : 02/17/2022 Notes: Electronic Signature(s) Signed: 05/04/2022 4:29:25 PM By: Rhae Hammock RN Entered By: Rhae Hammock on 05/01/2022 09:38:02 -------------------------------------------------------------------------------- Pain Assessment Details Patient Name: Date of Service: [name], DEBO RA H G. 05/01/2022 9:15 A M Medical Record Number: [id number] Patient Account Number: [id number] Date of Birth/Sex: Treating RN: Dec 30, 1960 (61 y.o. [name], Lauren Primary Care Rishi Vicario: Teressa Senter Other Clinician: Referring Caelen Reierson: Treating Geralda Baumgardner/Extender: Clare Charon in  Treatment: 10 Active Problems Location of Pain Severity and Description of Pain Patient Has Paino No Site Locations AMAAL, DIMARTINO ([id number]) [id number] Page 6 of 8 Pain Management and Medication Current Pain Management: Electronic Signature(s) Signed: 05/04/2022 4:29:25 PM By: Rhae Hammock RN Entered By: Rhae Hammock on 05/01/2022 09:28:10 -------------------------------------------------------------------------------- Patient/Caregiver Education Details Patient Name: Date of Service: Burgess Amor, [name] 10/9/2023andnbsp9:15 A M Medical Record Number: [id number] Patient Account Number: [id number] Date of Birth/Gender: Treating RN: 26-Jun-1961 (61 y.o. Benjaman Lobe Primary Care Physician: Teressa Senter Other Clinician: Referring Physician: Treating Physician/Extender: Clare Charon in Treatment: 10 Education Assessment Education Provided To: Patient Education Topics Provided Pain: Methods: Explain/Verbal Responses: State content correctly Electronic Signature(s) Signed: 05/04/2022 4:29:25 PM By: Rhae Hammock RN Entered By: Rhae Hammock on 05/01/2022 09:42:27 -------------------------------------------------------------------------------- Wound Assessment Details Patient Name: Date of Service: [name], DEBO RA H G. 05/01/2022 9:15 A M Medical Record Number: [id number] Patient Account Number: [id number] Date of Birth/Sex: Treating RN: 1960-11-07 (61 y.o. [name], Lauren Primary Care Tayari Yankee: Teressa Senter Other Clinician: Referring Chyane Greer: Treating Ilay Capshaw/Extender: Deitra Mayo, Lauren  CHANYA, CHRISLEY ([id number]) [id number] Page 7 of 8 Weeks in Treatment: 10 Wound Status Wound Number: 1 Primary Etiology: Open Surgical Wound Wound Location: Right Groin Wound Status: Open Wounding Event: Surgical Injury Comorbid History:  Hypertension, Type II Diabetes, Neuropathy Date Acquired: 01/13/2022 Weeks Of Treatment: 10 Clustered Wound: No Photos Wound Measurements Length: (cm) 2 Width: (cm) 1 Depth: (cm) 0.1 Area: (cm) 1.571 Volume: (cm) 0.157 % Reduction in Area: 97% % Reduction in Volume: 99.9% Epithelialization: Medium (34-66%) Tunneling: No Undermining: No Wound Description Classification: Full Thickness Without Exposed Support Structures Wound Margin: Distinct, outline attached Exudate Amount: Medium Exudate Type: Serosanguineous Exudate Color: red, brown Foul Odor After Cleansing: No Slough/Fibrino Yes Wound Bed Granulation Amount: Large (67-100%) Exposed Structure Granulation Quality: Red, Pink Fascia Exposed: No Necrotic Amount: Small (1-33%) Fat Layer (Subcutaneous Tissue) Exposed: Yes Necrotic Quality: Adherent Slough Tendon Exposed: No Muscle Exposed: No Joint Exposed: No Bone Exposed: No Periwound Skin Texture Texture Color No Abnormalities Noted: No No Abnormalities Noted: No Moisture No Abnormalities Noted: No Treatment Notes Wound #1 (Groin) Wound Laterality: Right Cleanser Wound Cleanser Discharge Instruction: Cleanse the wound with wound cleanser prior to applying a clean dressing using gauze sponges, not tissue or cotton balls. Peri-Wound Care Skin Prep Discharge Instruction: Use skin prep as directed Topical Primary Dressing AquacelAg Advantage Dressing, 6x6 (in/in) Secondary Dressing Zetuvit Plus Silicone Border Dressing 5x5 (in/in) Discharge Instruction: Apply silicone border over primary dressing as directed. ELEANER, DIBARTOLO ([id number]) [id number] Page 8 of 8 Secured With Compression Wrap Compression Stockings Environmental education officer) Signed: 05/02/2022 8:52:44 AM By: Erenest Blank Signed: 05/04/2022 4:29:25 PM By: Rhae Hammock RN Entered By: Erenest Blank on 05/01/2022  09:32:16 -------------------------------------------------------------------------------- Vitals Details Patient Name: Date of Service: [name], DEBO RA H G. 05/01/2022 9:15 A M Medical Record Number: [id number] Patient Account Number: [id number] Date of Birth/Sex: Treating RN: 1960-09-08 (61 y.o. [name], Lauren Primary Care Siddharth Babington: Teressa Senter Other Clinician: Referring Girtrude Enslin: Treating Ashlynn Gunnels/Extender: Deitra Mayo, Lauren Weeks in Treatment: 10 Vital Signs Time Taken: 09:26 Temperature (F): 98.2 Height (in): 64 Pulse (bpm): 82 Weight (lbs): 248 Respiratory Rate (breaths/min): 17 Body Mass Index (BMI): 42.6 Blood Pressure (mmHg): 131/65 Capillary Blood Glucose (mg/dl): 117 Reference Range: 80 - 120 mg / dl Electronic Signature(s) Signed: 05/04/2022 4:29:25 PM By: Rhae Hammock RN Entered By: Rhae Hammock on 05/01/2022 09:27:13

## 2022-05-18 ENCOUNTER — Ambulatory Visit: Payer: Medicare HMO | Admitting: Internal Medicine

## 2022-05-25 ENCOUNTER — Encounter (HOSPITAL_BASED_OUTPATIENT_CLINIC_OR_DEPARTMENT_OTHER): Payer: Medicare HMO | Attending: Internal Medicine | Admitting: Internal Medicine

## 2022-05-25 DIAGNOSIS — X58XXXA Exposure to other specified factors, initial encounter: Secondary | ICD-10-CM | POA: Diagnosis not present

## 2022-05-25 DIAGNOSIS — Z794 Long term (current) use of insulin: Secondary | ICD-10-CM | POA: Diagnosis not present

## 2022-05-25 DIAGNOSIS — E11622 Type 2 diabetes mellitus with other skin ulcer: Secondary | ICD-10-CM | POA: Diagnosis not present

## 2022-05-25 DIAGNOSIS — Z6841 Body Mass Index (BMI) 40.0 and over, adult: Secondary | ICD-10-CM | POA: Insufficient documentation

## 2022-05-25 DIAGNOSIS — N183 Chronic kidney disease, stage 3 unspecified: Secondary | ICD-10-CM | POA: Insufficient documentation

## 2022-05-25 DIAGNOSIS — S31819A Unspecified open wound of right buttock, initial encounter: Secondary | ICD-10-CM | POA: Diagnosis present

## 2022-05-25 DIAGNOSIS — E1122 Type 2 diabetes mellitus with diabetic chronic kidney disease: Secondary | ICD-10-CM | POA: Diagnosis not present

## 2022-05-25 DIAGNOSIS — S31502A Unspecified open wound of unspecified external genital organs, female, initial encounter: Secondary | ICD-10-CM | POA: Insufficient documentation

## 2022-05-25 DIAGNOSIS — M726 Necrotizing fasciitis: Secondary | ICD-10-CM | POA: Insufficient documentation

## 2022-05-25 DIAGNOSIS — E669 Obesity, unspecified: Secondary | ICD-10-CM | POA: Insufficient documentation

## 2022-05-27 NOTE — Progress Notes (Signed)
**Note [name]-Identified via Obfuscation** ADAMARI, FREDE ([id number]) [id number] Page 1 of 5 Visit Report for 05/25/2022 HPI Details Patient Name: Date of Service: [name] [name] 05/25/2022 1:00 PM Medical Record Number: [id number] Patient Account Number: [id number] Date of Birth/Sex: Treating RN: 08/14/1960 (61 y.o. F) Primary Care Provider: Teressa Senter Other Clinician: Referring Provider: Treating Provider/Extender: Wendall Mola in Treatment: 13 History of Present Illness HPI Description: Admission 02/09/2022 [name] is a 61 year old female who presents for controlled type 2 diabetes on insulin, obesity, and stage III kidney disease that presents the clinic for right groin wound. On 6/23 She presented to the ED for low back pain and found to have subcutaneous edema and air in the inferior right gluteal region concerning for Necrotizing infection. She had irrigation and debridement in the OR by Dr. Okey Dupre. She has been using wet-to-dry dressings. She reports stability in wound healing. She currently denies signs of infection. She has home health that comes out and changes the dressings twice a week. 8/10; patient presents for follow-up. She has been using the wound VAC to the wound bed with no issues. There is improvement in wound healing. She denies signs of infection. 8/24; patient presents for follow-up. She has been using the wound VAC with no issues. She reports improvement in wound healing. Home health is coming out to do VAC changes. 9/7; patient presents for follow-up. She has been using the wound VAC without any issues. She would like to stop the wound VAC at this time and do daily dressing changes. 9/21; patient presents for follow-up. She has no issues or complaints today. She has been using silver alginate to the wound beds without issues. She reports improvement in wound healing. 10/9; patient presents for follow-up. She has been  using silver alginate to the wound bed with benefit. She denies signs of infection. 11/2 right buttock wound was the last remanent of this just at the fold. This is healed Electronic Signature(s) Signed: 05/25/2022 4:03:30 PM By: Linton Ham MD Entered By: Linton Ham on 05/25/2022 13:44:17 -------------------------------------------------------------------------------- Physical Exam Details Patient Name: Date of Service: [name] [name], [name]. 05/25/2022 1:00 PM Medical Record Number: [id number] Patient Account Number: [id number] Date of Birth/Sex: Treating RN: Oct 16, 1960 (61 y.o. F) Primary Care Provider: Teressa Senter Other Clinician: Referring Provider: Treating Provider/Extender: Sofie Hartigan, [name] in Treatment: 13 Constitutional Sitting or standing Blood Pressure is within target range for patient.. Pulse regular and within target range for patient.Marland Kitchen Respirations regular, non-labored and within target range.. Temperature is normal and within the target range for the patient.Marland Kitchen Appears in no distress. Notes Wound exam; right buttock right at the fold. This is healed. As usual and wounds like this there is very little subcutaneous tissue. No evidence of surrounding soft tissue infection however Electronic Signature(s) Signed: 05/25/2022 4:03:30 PM By: Linton Ham MD Entered By: Linton Ham on 05/25/2022 13:46:12 [name] ([id number]) [id number] Page 2 of 5 -------------------------------------------------------------------------------- Physician Orders Details Patient Name: Date of Service: [name] 05/25/2022 1:00 PM Medical Record Number: [id number] Patient Account Number: [id number] Date of Birth/Sex: Treating RN: 1960-10-26 (61 y.o. [name] Primary Care Provider: Teressa Senter Other Clinician: Referring Provider: Treating Provider/Extender: Wendall Mola in Treatment: 13 Verbal / Phone Orders: No Diagnosis Coding ICD-10 Coding Code Description [phone] Unspecified open wound of right buttock, initial encounter S31.502A Unspecified open wound of unspecified external genital organs, female, initial encounter M72.6 Necrotizing fasciitis E11.622  Type 2 diabetes mellitus with other skin ulcer Discharge From [facility] Services Discharge from [place] - Call if any future wound care needs. Wound Treatment Electronic Signature(s) Signed: 05/25/2022 4:03:30 PM By: Linton Ham MD Signed: 05/26/2022 5:25:29 PM By: Deon Pilling RN, BSN Entered By: Deon Pilling on 05/25/2022 13:30:30 -------------------------------------------------------------------------------- Problem List Details Patient Name: Date of Service: [name] [name], [name]. 05/25/2022 1:00 PM Medical Record Number: [id number] Patient Account Number: [id number] Date of Birth/Sex: Treating RN: 1960-08-17 (61 y.o. [name], [name] Primary Care Provider: Teressa Senter Other Clinician: Referring Provider: Treating Provider/Extender: Wendall Mola in Treatment: 13 Active Problems ICD-10 Encounter Code Description Active Date MDM Diagnosis S31.819A Unspecified open wound of right buttock, initial encounter 02/17/2022 No Yes S31.502A Unspecified open wound of unspecified external genital organs, female, initial 02/17/2022 No Yes encounter M72.6 Necrotizing fasciitis 02/17/2022 No Yes E11.622 Type 2 diabetes mellitus with other skin ulcer 02/17/2022 No Yes [name], [name] ([id number]) [id number] Page 3 of 5 Inactive Problems Resolved Problems Electronic Signature(s) Signed: 05/25/2022 4:03:30 PM By: Linton Ham MD Entered By: Linton Ham on 05/25/2022 13:43:11 -------------------------------------------------------------------------------- Progress Note Details Patient Name: Date of Service: [name], [name]. 05/25/2022 1:00 PM Medical Record Number: [id number] Patient Account Number: [id number] Date of Birth/Sex: Treating RN: [birth year] (61 y.o. F) Primary Care Provider: Teressa Senter Other Clinician: Referring Provider: Treating Provider/Extender: Sofie Hartigan, [name] in Treatment: 13 Subjective History of Present Illness (HPI) Admission 02/09/2022 [name] is a 61 year old female who presents for controlled type 2 diabetes on insulin, obesity, and stage III kidney disease that presents the clinic for right groin wound. On 6/23 She presented to the ED for low back pain and found to have subcutaneous edema and air in the inferior right gluteal region concerning for Necrotizing infection. She had irrigation and debridement in the OR by Dr. Okey Dupre. She has been using wet-to-dry dressings. She reports stability in wound healing. She currently denies signs of infection. She has home health that comes out and changes the dressings twice a week. 8/10; patient presents for follow-up. She has been using the wound VAC to the wound bed with no issues. There is improvement in wound healing. She denies signs of infection. 8/24; patient presents for follow-up. She has been using the wound VAC with no issues. She reports improvement in wound healing. Home health is coming out to do VAC changes. 9/7; patient presents for follow-up. She has been using the wound VAC without any issues. She would like to stop the wound VAC at this time and do daily dressing changes. 9/21; patient presents for follow-up. She has no issues or complaints today. She has been using silver alginate to the wound beds without issues. She reports improvement in wound healing. 10/9; patient presents for follow-up. She has been using silver alginate to the wound bed with benefit. She denies signs of infection. 11/2 right buttock wound was the last remanent of this just at the fold. This  is healed Objective Constitutional Sitting or standing Blood Pressure is within target range for patient.. Pulse regular and within target range for patient.Marland Kitchen Respirations regular, non-labored and within target range.. Temperature is normal and within the target range for the patient.Marland Kitchen Appears in no distress. Vitals Time Taken: 1:23 PM, Height: 64 in, Weight: 248 lbs, BMI: 42.6, Temperature: 98.4 F, Pulse: 80 bpm, Respiratory Rate: 16 breaths/min, Blood Pressure: 120/74 mmHg, Capillary Blood Glucose: 129 mg/dl. General Notes: Wound exam; right  buttock right at the fold. This is healed. As usual and wounds like this there is very little subcutaneous tissue. No evidence of surrounding soft tissue infection however Integumentary (Hair, Skin) Wound #1 status is Healed - Epithelialized. Original cause of wound was Surgical Injury. The date acquired was: 01/13/2022. The wound has been in treatment 13 weeks. The wound is located on the Right Groin. The wound measures 0cm length x 0cm width x 0cm depth; 0cm^2 area and 0cm^3 volume. There is no tunneling or undermining noted. There is a medium amount of serosanguineous drainage noted. The wound margin is distinct with the outline attached to the wound base. There is no granulation within the wound bed. There is no necrotic tissue within the wound bed. The periwound skin appearance did not exhibit: Callus, Crepitus, Excoriation, Induration, Rash, Scarring, Dry/Scaly, Maceration, Atrophie Blanche, Cyanosis, Ecchymosis, Hemosiderin Staining, Mottled, Pallor, Rubor, Erythema. [name], [name] ([id number]) [id number] Page 4 of 5 Assessment Active Problems ICD-10 Unspecified open wound of right buttock, initial encounter Unspecified open wound of unspecified external genital organs, female, initial encounter Necrotizing fasciitis Type 2 diabetes mellitus with other skin ulcer Plan Discharge From [facility] Services: Discharge  from [place] - Call if any future wound care needs. 1. The patient can be discharged from the wound care center 2. She was counseled on pressure relief on this area Electronic Signature(s) Signed: 05/25/2022 4:03:30 PM By: Linton Ham MD Entered By: Linton Ham on 05/25/2022 13:47:18 -------------------------------------------------------------------------------- SuperBill Details Patient Name: Date of Service: [name], [name]. 05/25/2022 Medical Record Number: [id number] Patient Account Number: [id number] Date of Birth/Sex: Treating RN: 08-19-1960 (61 y.o. [name], [name] Primary Care Provider: Teressa Senter Other Clinician: Referring Provider: Treating Provider/Extender: Wendall Mola in Treatment: 13 Diagnosis Coding ICD-10 Codes Code Description [phone] Unspecified open wound of right buttock, initial encounter S31.502A Unspecified open wound of unspecified external genital organs, female, initial encounter M72.6 Necrotizing fasciitis E11.622 Type 2 diabetes mellitus with other skin ulcer Facility Procedures : CPT4 Code: [id number] Description: 99213 - WOUND CARE VISIT-LEV 3 EST PT Modifier: Quantity: 1 Physician Procedures : CPT4 Code Description Modifier [id number] - WC PHYS LEVEL 2 - EST PT ICD-10 Diagnosis Description P37.902I Unspecified open wound of right buttock, initial encounter Quantity: 1 Electronic Signature(s) [name], [name] ([id number]) [id number] Page 5 of 5 Signed: 05/25/2022 4:03:30 PM By: Linton Ham MD Entered By: Linton Ham on 05/25/2022 13:47:30

## 2022-05-27 NOTE — Progress Notes (Signed)
AMANDALYNN, PITZ ([id number]) [id number] Page 1 of 7 Visit Report for 05/25/2022 Arrival Information Details Patient Name: Date of Service: [name] 05/25/2022 1:00 PM Medical Record Number: [id number] Patient Account Number: [id number] Date of Birth/Sex: Treating RN: 1960/08/08 (61 y.o. F) Primary Care Antha Niday: Teressa Senter Other Clinician: Referring Haziel Molner: Treating Wyn Nettle/Extender: Wendall Mola in Treatment: 13 Visit Information History Since Last Visit Added or deleted any medications: No Patient Arrived: Wheel Chair Any new allergies or adverse reactions: No Arrival Time: 13:23 Had a fall or experienced change in No Accompanied By: daughter activities of daily living that may affect Transfer Assistance: None risk of falls: Patient Identification Verified: Yes Signs or symptoms of abuse/neglect since last visito No Secondary Verification Process Completed: Yes Hospitalized since last visit: No Patient Requires Transmission-Based Precautions: No Implantable device outside of the clinic excluding No Patient Has Alerts: No cellular tissue based products placed in the center since last visit: Has Dressing in Place as Prescribed: Yes Pain Present Now: No Electronic Signature(s) Signed: 05/25/2022 5:21:20 PM By: Erenest Blank Entered By: Erenest Blank on 05/25/2022 13:23:41 -------------------------------------------------------------------------------- Clinic Level of Care Assessment Details Patient Name: Date of Service: [name] 05/25/2022 1:00 PM Medical Record Number: [id number] Patient Account Number: [id number] Date of Birth/Sex: Treating RN: 01-31-1961 (61 y.o. Debby Bud Primary Care Darden Flemister: Teressa Senter Other Clinician: Referring Briggette Najarian: Treating Verne Lanuza/Extender: Wendall Mola in Treatment: 13 Clinic Level of Care Assessment  Items TOOL 4 Quantity Score X- 1 0 Use when only an EandM is performed on FOLLOW-UP visit ASSESSMENTS - Nursing Assessment / Reassessment X- 1 10 Reassessment of Co-morbidities (includes updates in patient status) X- 1 5 Reassessment of Adherence to Treatment Plan ASSESSMENTS - Wound and Skin A ssessment / Reassessment X - Simple Wound Assessment / Reassessment - one wound 1 5 '[]'$  - 0 Complex Wound Assessment / Reassessment - multiple wounds X- 1 10 Dermatologic / Skin Assessment (not related to wound area) ASSESSMENTS - Focused Assessment '[]'$  - 0 Circumferential Edema Measurements - multi extremities '[]'$  - 0 Nutritional Assessment / Counseling / Intervention [name], [name] ([id number]) [id number] Page 2 of 7 '[]'$  - 0 Lower Extremity Assessment (monofilament, tuning fork, pulses) '[]'$  - 0 Peripheral Arterial Disease Assessment (using hand held doppler) ASSESSMENTS - Ostomy and/or Continence Assessment and Care '[]'$  - 0 Incontinence Assessment and Management '[]'$  - 0 Ostomy Care Assessment and Management (repouching, etc.) PROCESS - Coordination of Care X - Simple Patient / Family Education for ongoing care 1 15 '[]'$  - 0 Complex (extensive) Patient / Family Education for ongoing care X- 1 10 Staff obtains Programmer, systems, Records, T Results / Process Orders est '[]'$  - 0 Staff telephones HHA, Nursing Homes / Clarify orders / etc '[]'$  - 0 Routine Transfer to another Facility (non-emergent condition) '[]'$  - 0 Routine Hospital Admission (non-emergent condition) '[]'$  - 0 New Admissions / Biomedical engineer / Ordering NPWT Apligraf, etc. , '[]'$  - 0 Emergency Hospital Admission (emergent condition) X- 1 10 Simple Discharge Coordination '[]'$  - 0 Complex (extensive) Discharge Coordination PROCESS - Special Needs '[]'$  - 0 Pediatric / Minor Patient Management '[]'$  - 0 Isolation Patient Management '[]'$  - 0 Hearing / Language / Visual special needs '[]'$  - 0 Assessment of  Community assistance (transportation, D/C planning, etc.) '[]'$  - 0 Additional assistance / Altered mentation '[]'$  - 0 Support Surface(s) Assessment (bed, cushion, seat, etc.) INTERVENTIONS - Wound Cleansing / Measurement X - Simple Wound  Cleansing - one wound 1 5 '[]'$  - 0 Complex Wound Cleansing - multiple wounds X- 1 5 Wound Imaging (photographs - any number of wounds) '[]'$  - 0 Wound Tracing (instead of photographs) X- 1 5 Simple Wound Measurement - one wound '[]'$  - 0 Complex Wound Measurement - multiple wounds INTERVENTIONS - Wound Dressings '[]'$  - 0 Small Wound Dressing one or multiple wounds '[]'$  - 0 Medium Wound Dressing one or multiple wounds '[]'$  - 0 Large Wound Dressing one or multiple wounds '[]'$  - 0 Application of Medications - topical '[]'$  - 0 Application of Medications - injection INTERVENTIONS - Miscellaneous '[]'$  - 0 External ear exam '[]'$  - 0 Specimen Collection (cultures, biopsies, blood, body fluids, etc.) '[]'$  - 0 Specimen(s) / Culture(s) sent or taken to Lab for analysis '[]'$  - 0 Patient Transfer (multiple staff / Civil Service fast streamer / Similar devices) '[]'$  - 0 Simple Staple / Suture removal (25 or less) '[]'$  - 0 Complex Staple / Suture removal (26 or more) '[]'$  - 0 Hypo / Hyperglycemic Management (close monitor of Blood Glucose) [name], [name] ([id number]) [id number] Page 3 of 7 '[]'$  - 0 Ankle / Brachial Index (ABI) - do not check if billed separately X- 1 5 Vital Signs Has the patient been seen at the hospital within the last three years: Yes Total Score: 85 Level Of Care: New/Established - Level 3 Electronic Signature(s) Signed: 05/26/2022 5:25:29 PM By: Deon Pilling RN, BSN Entered By: Deon Pilling on 05/25/2022 13:30:59 -------------------------------------------------------------------------------- Encounter Discharge Information Details Patient Name: Date of Service: [name], [name] RA H G. 05/25/2022 1:00 PM Medical Record Number:  [id number] Patient Account Number: [id number] Date of Birth/Sex: Treating RN: [birth year] (61 y.o. Debby Bud Primary Care Ahmya Bernick: Teressa Senter Other Clinician: Referring Kishan Wachsmuth: Treating Ellouise Mcwhirter/Extender: Wendall Mola in Treatment: 13 Encounter Discharge Information Items Discharge Condition: Stable Ambulatory Status: Wheelchair Discharge Destination: Home Transportation: Private Auto Accompanied By: daughter Schedule Follow-up Appointment: No Clinical Summary of Care: Electronic Signature(s) Signed: 05/26/2022 5:25:29 PM By: Deon Pilling RN, BSN Entered By: Deon Pilling on 05/25/2022 13:31:25 -------------------------------------------------------------------------------- Lower Extremity Assessment Details Patient Name: Date of Service: [name], [name] RA H G. 05/25/2022 1:00 PM Medical Record Number: [id number] Patient Account Number: [id number] Date of Birth/Sex: Treating RN: 1961-07-12 (61 y.o. F) Primary Care Rosezetta Balderston: Teressa Senter Other Clinician: Referring Adalei Novell: Treating Eliabeth Shoff/Extender: Wendall Mola in Treatment: 13 Electronic Signature(s) Signed: 05/25/2022 5:21:20 PM By: Erenest Blank Entered By: Erenest Blank on 05/25/2022 13:24:45 -------------------------------------------------------------------------------- Multi Wound Chart Details Patient Name: Date of Service: [name], [name] RA H G. 05/25/2022 1:00 PM Medical Record Number: [id number] Patient Account Number: [id number] [name], [name] ([id number]) [id number] Page 4 of 7 Date of Birth/Sex: Treating RN: [birth year] (61 y.o. F) Primary Care Marston Mccadden: Teressa Senter Other Clinician: Referring Saren Corkern: Treating Leniyah Martell/Extender: Sofie Hartigan, Vicie Mutters in Treatment: 13 Vital Signs Height(in): 64 Capillary Blood Glucose(mg/dl): 129 Weight(lbs): 248 Pulse(bpm): 80 Body Mass Index(BMI):  42.6 Blood Pressure(mmHg): 120/74 Temperature(F): 98.4 Respiratory Rate(breaths/min): 16 [1:Photos:] [N/A:N/A] Right Groin N/A N/A Wound Location: Surgical Injury N/A N/A Wounding Event: Open Surgical Wound N/A N/A Primary Etiology: Hypertension, Type II Diabetes, N/A N/A Comorbid History: Neuropathy 01/13/2022 N/A N/A Date Acquired: 13 N/A N/A Weeks of Treatment: Healed - Epithelialized N/A N/A Wound Status: No N/A N/A Wound Recurrence: 0x0x0 N/A N/A Measurements L x W x D (cm) 0 N/A N/A A (cm) : rea 0 N/A N/A Volume (cm) : 100.00% N/A N/A % Reduction in Area: 100.00%  N/A N/A % Reduction in Volume: Full Thickness Without Exposed N/A N/A Classification: Support Structures Medium N/A N/A Exudate Amount: Serosanguineous N/A N/A Exudate Type: red, brown N/A N/A Exudate Color: Distinct, outline attached N/A N/A Wound Margin: None Present (0%) N/A N/A Granulation Amount: None Present (0%) N/A N/A Necrotic Amount: Fascia: No N/A N/A Exposed Structures: Fat Layer (Subcutaneous Tissue): No Tendon: No Muscle: No Joint: No Bone: No Large (67-100%) N/A N/A Epithelialization: Excoriation: No N/A N/A Periwound Skin Texture: Induration: No Callus: No Crepitus: No Rash: No Scarring: No Maceration: No N/A N/A Periwound Skin Moisture: Dry/Scaly: No Atrophie Blanche: No N/A N/A Periwound Skin Color: Cyanosis: No Ecchymosis: No Erythema: No Hemosiderin Staining: No Mottled: No Pallor: No Rubor: No Treatment Notes Electronic Signature(s) Signed: 05/25/2022 4:03:30 PM By: Linton Ham MD Entered By: Linton Ham on 05/25/2022 13:43:21 [name] ([id number]) [id number] Page 5 of 7 -------------------------------------------------------------------------------- Multi-Disciplinary Care Plan Details Patient Name: Date of Service: [name] 05/25/2022 1:00 PM Medical Record Number: [id number] Patient  Account Number: [id number] Date of Birth/Sex: Treating RN: 1961/04/05 (61 y.o. Debby Bud Primary Care Jersee Winiarski: Teressa Senter Other Clinician: Referring Lomax Poehler: Treating Myleah Cavendish/Extender: Wendall Mola in Treatment: 13 Active Inactive Electronic Signature(s) Signed: 05/26/2022 5:25:29 PM By: Deon Pilling RN, BSN Entered By: Deon Pilling on 05/25/2022 13:29:56 -------------------------------------------------------------------------------- Pain Assessment Details Patient Name: Date of Service: [name], [name] RA H G. 05/25/2022 1:00 PM Medical Record Number: [id number] Patient Account Number: [id number] Date of Birth/Sex: Treating RN: 07-28-1960 (61 y.o. F) Primary Care Lavontay Kirk: Teressa Senter Other Clinician: Referring Lizbet Cirrincione: Treating Fabien Travelstead/Extender: Wendall Mola in Treatment: 13 Active Problems Location of Pain Severity and Description of Pain Patient Has Paino No Site Locations Pain Management and Medication Current Pain Management: Electronic Signature(s) Signed: 05/25/2022 5:21:20 PM By: Erenest Blank Entered By: Erenest Blank on 05/25/2022 13:24:38 [name] ([id number]) [id number] Page 6 of 7 -------------------------------------------------------------------------------- Wound Assessment Details Patient Name: Date of Service: [name] 05/25/2022 1:00 PM Medical Record Number: [id number] Patient Account Number: [id number] Date of Birth/Sex: Treating RN: 11-16-1960 (61 y.o. Helene Shoe, Meta.Reding Primary Care Jaleea Alesi: Teressa Senter Other Clinician: Referring Adrienna Karis: Treating Semone Orlov/Extender: Wendall Mola in Treatment: 13 Wound Status Wound Number: 1 Primary Etiology: Open Surgical Wound Wound Location: Right Groin Wound Status: Healed - Epithelialized Wounding Event: Surgical Injury Comorbid History: Hypertension,  Type II Diabetes, Neuropathy Date Acquired: 01/13/2022 Weeks Of Treatment: 13 Clustered Wound: No Photos Wound Measurements Length: (cm) Width: (cm) Depth: (cm) Area: (cm) Volume: (cm) 0 % Reduction in Area: 100% 0 % Reduction in Volume: 100% 0 Epithelialization: Large (67-100%) 0 Tunneling: No 0 Undermining: No Wound Description Classification: Full Thickness Without Exposed Support Structures Wound Margin: Distinct, outline attached Exudate Amount: Medium Exudate Type: Serosanguineous Exudate Color: red, brown Foul Odor After Cleansing: No Slough/Fibrino Yes Wound Bed Granulation Amount: None Present (0%) Exposed Structure Necrotic Amount: None Present (0%) Fascia Exposed: No Fat Layer (Subcutaneous Tissue) Exposed: No Tendon Exposed: No Muscle Exposed: No Joint Exposed: No Bone Exposed: No Periwound Skin Texture Texture Color No Abnormalities Noted: No No Abnormalities Noted: No Callus: No Atrophie Blanche: No Crepitus: No Cyanosis: No Excoriation: No Ecchymosis: No Induration: No Erythema: No Rash: No Hemosiderin Staining: No Scarring: No Mottled: No Pallor: No Moisture Rubor: No No Abnormalities Noted: No Dry / Scaly: No Maceration: No [name], [name] ([id number]) [id number] Page 7 of 7 Electronic Signature(s) Signed: 05/26/2022 5:25:29 PM  By: Deon Pilling RN, BSN Entered By: Deon Pilling on 05/25/2022 13:30:34 -------------------------------------------------------------------------------- Vitals Details Patient Name: Date of Service: [name], [name] RA H G. 05/25/2022 1:00 PM Medical Record Number: [id number] Patient Account Number: [id number] Date of Birth/Sex: Treating RN: 09-03-1960 (61 y.o. F) Primary Care Olga Bourbeau: Teressa Senter Other Clinician: Referring Zelpha Messing: Treating Kalai Baca/Extender: Wendall Mola in Treatment: 13 Vital Signs Time Taken: 13:23 Temperature (F):  98.4 Height (in): 64 Pulse (bpm): 80 Weight (lbs): 248 Respiratory Rate (breaths/min): 16 Body Mass Index (BMI): 42.6 Blood Pressure (mmHg): 120/74 Capillary Blood Glucose (mg/dl): 129 Reference Range: 80 - 120 mg / dl Electronic Signature(s) Signed: 05/25/2022 5:21:20 PM By: Erenest Blank Entered By: Erenest Blank on 05/25/2022 13:24:27

## 2022-06-27 ENCOUNTER — Inpatient Hospital Stay (HOSPITAL_COMMUNITY)
Admission: EM | Admit: 2022-06-27 | Discharge: 2022-07-01 | DRG: 312 | Disposition: A | Discharge status: Home / Self Care | Payer: Medicare HMO | Attending: Internal Medicine | Admitting: Internal Medicine

## 2022-06-27 DIAGNOSIS — Z79899 Other long term (current) drug therapy: Secondary | ICD-10-CM

## 2022-06-27 DIAGNOSIS — E1165 Type 2 diabetes mellitus with hyperglycemia: Secondary | ICD-10-CM | POA: Diagnosis not present

## 2022-06-27 DIAGNOSIS — K921 Melena: Secondary | ICD-10-CM | POA: Diagnosis not present

## 2022-06-27 DIAGNOSIS — Z886 Allergy status to analgesic agent status: Secondary | ICD-10-CM

## 2022-06-27 DIAGNOSIS — A09 Infectious gastroenteritis and colitis, unspecified: Secondary | ICD-10-CM | POA: Diagnosis not present

## 2022-06-27 DIAGNOSIS — N1831 Chronic kidney disease, stage 3a: Secondary | ICD-10-CM | POA: Diagnosis present

## 2022-06-27 DIAGNOSIS — E1143 Type 2 diabetes mellitus with diabetic autonomic (poly)neuropathy: Secondary | ICD-10-CM | POA: Diagnosis present

## 2022-06-27 DIAGNOSIS — I951 Orthostatic hypotension: Principal | ICD-10-CM | POA: Diagnosis present

## 2022-06-27 DIAGNOSIS — C642 Malignant neoplasm of left kidney, except renal pelvis: Secondary | ICD-10-CM | POA: Diagnosis present

## 2022-06-27 DIAGNOSIS — Z888 Allergy status to other drugs, medicaments and biological substances status: Secondary | ICD-10-CM

## 2022-06-27 DIAGNOSIS — K5730 Diverticulosis of large intestine without perforation or abscess without bleeding: Secondary | ICD-10-CM | POA: Diagnosis present

## 2022-06-27 DIAGNOSIS — K5903 Drug induced constipation: Secondary | ICD-10-CM | POA: Diagnosis present

## 2022-06-27 DIAGNOSIS — E1122 Type 2 diabetes mellitus with diabetic chronic kidney disease: Secondary | ICD-10-CM | POA: Diagnosis present

## 2022-06-27 DIAGNOSIS — N39490 Overflow incontinence: Secondary | ICD-10-CM | POA: Diagnosis present

## 2022-06-27 DIAGNOSIS — Z6841 Body Mass Index (BMI) 40.0 and over, adult: Secondary | ICD-10-CM

## 2022-06-27 DIAGNOSIS — Z811 Family history of alcohol abuse and dependence: Secondary | ICD-10-CM

## 2022-06-27 DIAGNOSIS — R55 Syncope and collapse: Principal | ICD-10-CM

## 2022-06-27 DIAGNOSIS — R339 Retention of urine, unspecified: Secondary | ICD-10-CM | POA: Diagnosis not present

## 2022-06-27 DIAGNOSIS — T501X5A Adverse effect of loop [high-ceiling] diuretics, initial encounter: Secondary | ICD-10-CM | POA: Diagnosis present

## 2022-06-27 DIAGNOSIS — Z8673 Personal history of transient ischemic attack (TIA), and cerebral infarction without residual deficits: Secondary | ICD-10-CM

## 2022-06-27 DIAGNOSIS — I129 Hypertensive chronic kidney disease with stage 1 through stage 4 chronic kidney disease, or unspecified chronic kidney disease: Secondary | ICD-10-CM | POA: Diagnosis present

## 2022-06-27 DIAGNOSIS — Z8249 Family history of ischemic heart disease and other diseases of the circulatory system: Secondary | ICD-10-CM

## 2022-06-27 DIAGNOSIS — Z885 Allergy status to narcotic agent status: Secondary | ICD-10-CM

## 2022-06-27 DIAGNOSIS — E860 Dehydration: Secondary | ICD-10-CM | POA: Diagnosis present

## 2022-06-27 DIAGNOSIS — Z86010 Personal history of colonic polyps: Secondary | ICD-10-CM

## 2022-06-27 DIAGNOSIS — K559 Vascular disorder of intestine, unspecified: Secondary | ICD-10-CM | POA: Diagnosis present

## 2022-06-27 DIAGNOSIS — Z91041 Radiographic dye allergy status: Secondary | ICD-10-CM

## 2022-06-27 DIAGNOSIS — Z833 Family history of diabetes mellitus: Secondary | ICD-10-CM

## 2022-06-27 LAB — CBC WITH DIFFERENTIAL/PLATELET
Abs Immature Granulocytes: 0.16 10*3/uL — ABNORMAL HIGH (ref 0.00–0.07)
Basophils Absolute: 0.1 10*3/uL (ref 0.0–0.1)
Basophils Relative: 1 %
Eosinophils Absolute: 0.2 10*3/uL (ref 0.0–0.5)
Eosinophils Relative: 2 %
HCT: 38.3 % (ref 36.0–46.0)
Hemoglobin: 12.6 g/dL (ref 12.0–15.0)
Immature Granulocytes: 2 %
Lymphocytes Relative: 22 %
Lymphs Abs: 2.4 10*3/uL (ref 0.7–4.0)
MCH: 30.7 pg (ref 26.0–34.0)
MCHC: 32.9 g/dL (ref 30.0–36.0)
MCV: 93.4 fL (ref 80.0–100.0)
Monocytes Absolute: 0.6 10*3/uL (ref 0.1–1.0)
Monocytes Relative: 5 %
Neutro Abs: 7.6 10*3/uL (ref 1.7–7.7)
Neutrophils Relative %: 68 %
Platelets: 216 10*3/uL (ref 150–400)
RBC: 4.1 MIL/uL (ref 3.87–5.11)
RDW: 14.9 % (ref 11.5–15.5)
WBC: 11 10*3/uL — ABNORMAL HIGH (ref 4.0–10.5)
nRBC: 0 % (ref 0.0–0.2)

## 2022-06-27 MED ORDER — LACTATED RINGERS IV BOLUS
1000.0000 mL | Freq: Once | INTRAVENOUS | Status: AC
  Administered 2022-06-27: 1000 mL via INTRAVENOUS

## 2022-06-27 MED ORDER — LACTATED RINGERS IV BOLUS
1000.0000 mL | Freq: Once | INTRAVENOUS | Status: AC
  Administered 2022-06-28: 1000 mL via INTRAVENOUS

## 2022-06-27 NOTE — ED Provider Notes (Incomplete)
61 year old female with history of syncope was brought in by ambulance after having a syncopal episode at a restaurant.  Initial blood pressure reported to be 70 systolic improved after IV fluids.

## 2022-06-27 NOTE — ED Triage Notes (Signed)
Patient had syncopal episode in [employer]room of [employer]. Pt has PMH of syncope and dehydration. Pt arrive disorientedx4 responding to tactile stimulation.

## 2022-06-28 ENCOUNTER — Emergency Department (HOSPITAL_COMMUNITY): Payer: Medicare HMO

## 2022-06-28 ENCOUNTER — Other Ambulatory Visit: Payer: Self-pay

## 2022-06-28 ENCOUNTER — Encounter (HOSPITAL_COMMUNITY): Payer: Self-pay | Admitting: Internal Medicine

## 2022-06-28 DIAGNOSIS — I959 Hypotension, unspecified: Secondary | ICD-10-CM | POA: Diagnosis not present

## 2022-06-28 DIAGNOSIS — R1032 Left lower quadrant pain: Secondary | ICD-10-CM | POA: Diagnosis not present

## 2022-06-28 DIAGNOSIS — R55 Syncope and collapse: Secondary | ICD-10-CM

## 2022-06-28 DIAGNOSIS — C642 Malignant neoplasm of left kidney, except renal pelvis: Secondary | ICD-10-CM | POA: Diagnosis present

## 2022-06-28 DIAGNOSIS — K921 Melena: Secondary | ICD-10-CM | POA: Diagnosis not present

## 2022-06-28 LAB — TYPE AND SCREEN
ABO/RH(D): O POS
Antibody Screen: NEGATIVE

## 2022-06-28 LAB — CBC
HCT: 35.1 % — ABNORMAL LOW (ref 36.0–46.0)
Hemoglobin: 11.4 g/dL — ABNORMAL LOW (ref 12.0–15.0)
MCH: 30.6 pg (ref 26.0–34.0)
MCHC: 32.5 g/dL (ref 30.0–36.0)
MCV: 94.1 fL (ref 80.0–100.0)
Platelets: 189 10*3/uL (ref 150–400)
RBC: 3.73 MIL/uL — ABNORMAL LOW (ref 3.87–5.11)
RDW: 15.2 % (ref 11.5–15.5)
WBC: 9.4 10*3/uL (ref 4.0–10.5)
nRBC: 0 % (ref 0.0–0.2)

## 2022-06-28 LAB — GASTROINTESTINAL PANEL BY PCR, STOOL (REPLACES STOOL CULTURE)

## 2022-06-28 LAB — COMPREHENSIVE METABOLIC PANEL
ALT: 19 U/L (ref 0–44)
AST: 29 U/L (ref 15–41)
Albumin: 3.4 g/dL — ABNORMAL LOW (ref 3.5–5.0)
Alkaline Phosphatase: 132 U/L — ABNORMAL HIGH (ref 38–126)
Anion gap: 12 (ref 5–15)
BUN: 40 mg/dL — ABNORMAL HIGH (ref 6–20)
CO2: 24 mmol/L (ref 22–32)
Calcium: 9.1 mg/dL (ref 8.9–10.3)
Chloride: 102 mmol/L (ref 98–111)
Creatinine, Ser: 1.87 mg/dL — ABNORMAL HIGH (ref 0.44–1.00)
GFR, Estimated: 31 mL/min — ABNORMAL LOW (ref >60)
Glucose, Bld: 212 mg/dL — ABNORMAL HIGH (ref 70–99)
Potassium: 3.9 mmol/L (ref 3.5–5.1)
Sodium: 138 mmol/L (ref 135–145)
Total Bilirubin: 0.9 mg/dL (ref 0.3–1.2)
Total Protein: 7 g/dL (ref 6.5–8.1)

## 2022-06-28 LAB — MAGNESIUM: Magnesium: 2.2 mg/dL (ref 1.7–2.4)

## 2022-06-28 LAB — CBG MONITORING, ED
Glucose-Capillary: 206 mg/dL — ABNORMAL HIGH (ref 70–99)
Glucose-Capillary: 225 mg/dL — ABNORMAL HIGH (ref 70–99)
Glucose-Capillary: 112 mg/dL — ABNORMAL HIGH (ref 70–99)
Glucose-Capillary: 139 mg/dL — ABNORMAL HIGH (ref 70–99)
Glucose-Capillary: 151 mg/dL — ABNORMAL HIGH (ref 70–99)

## 2022-06-28 LAB — TROPONIN I (HIGH SENSITIVITY)
Troponin I (High Sensitivity): 4 ng/L (ref <18)
Troponin I (High Sensitivity): 3 ng/L (ref <18)

## 2022-06-28 LAB — PROTIME-INR
INR: 1.1 (ref 0.8–1.2)
Prothrombin Time: 14.4 seconds (ref 11.4–15.2)

## 2022-06-28 LAB — HEMOGLOBIN AND HEMATOCRIT, BLOOD
HCT: 31.1 % — ABNORMAL LOW (ref 36.0–46.0)
Hemoglobin: 9.8 g/dL — ABNORMAL LOW (ref 12.0–15.0)

## 2022-06-28 LAB — ABO/RH: ABO/RH(D): O POS

## 2022-06-28 LAB — C DIFFICILE QUICK SCREEN W PCR REFLEX
C Diff antigen: NEGATIVE
C Diff interpretation: NOT DETECTED
C Diff toxin: NEGATIVE

## 2022-06-28 LAB — BRAIN NATRIURETIC PEPTIDE: B Natriuretic Peptide: 23.9 pg/mL (ref 0.0–100.0)

## 2022-06-28 MED ORDER — LACTATED RINGERS IV BOLUS
1000.0000 mL | Freq: Once | INTRAVENOUS | Status: AC
  Administered 2022-06-28: 1000 mL via INTRAVENOUS

## 2022-06-28 MED ORDER — PEG-KCL-NACL-NASULF-NA ASC-C 100 G PO SOLR: 1.0000 | Freq: Once | ORAL | Status: DC

## 2022-06-28 MED ORDER — IOHEXOL 350 MG/ML SOLN
75.0000 mL | Freq: Once | INTRAVENOUS | Status: AC | PRN
  Administered 2022-06-28: 75 mL via INTRAVENOUS

## 2022-06-28 MED ORDER — PIPERACILLIN-TAZOBACTAM 3.375 G IVPB 30 MIN
3.3750 g | Freq: Once | INTRAVENOUS | Status: AC
  Administered 2022-06-28: 3.375 g via INTRAVENOUS
  Filled 2022-06-28: qty 50

## 2022-06-28 MED ORDER — ENOXAPARIN SODIUM 40 MG/0.4ML IJ SOSY
40.0000 mg | PREFILLED_SYRINGE | Freq: Every day | INTRAMUSCULAR | Status: DC
  Administered 2022-06-28: 40 mg via SUBCUTANEOUS
  Filled 2022-06-28: qty 0.4

## 2022-06-28 MED ORDER — PEG-KCL-NACL-NASULF-NA ASC-C 100 G PO SOLR
0.5000 | Freq: Once | ORAL | Status: DC
  Filled 2022-06-28: qty 1

## 2022-06-28 MED ORDER — PIPERACILLIN-TAZOBACTAM 3.375 G IVPB
3.3750 g | Freq: Three times a day (TID) | INTRAVENOUS | Status: DC
  Administered 2022-06-29 – 2022-07-01 (×8): 3.375 g via INTRAVENOUS
  Filled 2022-06-28 (×9): qty 50

## 2022-06-28 MED ORDER — INSULIN ASPART 100 UNIT/ML IJ SOLN
0.0000 [IU] | Freq: Three times a day (TID) | INTRAMUSCULAR | Status: DC
  Administered 2022-06-28: 7 [IU] via SUBCUTANEOUS
  Administered 2022-06-29: 4 [IU] via SUBCUTANEOUS
  Administered 2022-06-29 – 2022-06-30 (×4): 3 [IU] via SUBCUTANEOUS
  Administered 2022-07-01: 4 [IU] via SUBCUTANEOUS

## 2022-06-28 MED ORDER — PEG-KCL-NACL-NASULF-NA ASC-C 100 G PO SOLR
0.5000 | Freq: Once | ORAL | Status: DC
  Filled 2022-06-28 (×3): qty 1

## 2022-06-28 MED ORDER — SODIUM CHLORIDE 0.9% FLUSH
3.0000 mL | Freq: Two times a day (BID) | INTRAVENOUS | Status: DC
  Administered 2022-06-28 – 2022-06-30 (×4): 3 mL via INTRAVENOUS

## 2022-06-28 MED ORDER — ACETAMINOPHEN 325 MG PO TABS
650.0000 mg | ORAL_TABLET | Freq: Four times a day (QID) | ORAL | Status: DC | PRN
  Administered 2022-06-28: 650 mg via ORAL
  Filled 2022-06-28: qty 2

## 2022-06-28 NOTE — Consult Note (Addendum)
Referring Provider: No ref. provider found Primary Care Physician:  Teressa Senter, FNP Primary Gastroenterologist:  Althia Forts  Reason for Consultation:  GI bleed  HPI: [name] is a 61 y.o. female with a pmh of HTN, T2DM, MDD,CKD 3 who presented to the emergency department for evaluation after a syncopal episode at a restaurant last evening.  She says that she was having some lower abdominal pain, but no other specific symptoms.  While here she started having gastrointestinal bleeding.  It is described as large volume bloody bowel movements.  Repeat labs are pending as initial Hgb was normal yesterday.  Packed cells have been ordered.  She reports having colonoscopy about 6 years ago in [place] where she had colon polyps removed and they recommended a follow-up in 1 year, but she never proceeded with that.  CT scan of the abdomen and pelvis with contrast here did not show any acute abnormalities, but does show an enlarging left renal lesion concerning for RCC.  CT scan from July did mention sigmoid diverticulosis.   Of note, I was informed by the medicine service that radiologist did an over read of her CT scan saying that she may have some mild colonic wall thickening of the sigmoid colon equivocal but possible distal colitis.  Stool studies negative.    She is following with urology for her renal mass.  And actually did have an ablation of the mass last week.  She currently reports some ongoing lower abdominal discomfort.  She tells me that she struggles with issues with constipation due to taking Mounjaro.  She takes Metamucil and stool softeners, but they do not really seem to help.  Her last good bowel movement was several days ago.  Denies nausea, vomiting, heartburn or reflux type symptoms.  Does not take any NSAIDs.  Says that she has lost 60 pounds with the [facility].   Past Medical History:  Diagnosis Date   Chest pain    a. 06/2017: cath showing normal cors with a  preserved EF of 55-65%.    Depression    Diabetes mellitus    Hypertension    Neuropathy    Pneumonia    Renal disorder    Sepsis ([place])     Past Surgical History:  Procedure Laterality Date   ABDOMINAL HYSTERECTOMY     APPENDECTOMY     CESAREAN SECTION     CHOLECYSTECTOMY     IRRIGATION AND DEBRIDEMENT BUTTOCKS Right 01/13/2022   Procedure: IRRIGATION AND DEBRIDEMENT BUTTOCKS AND THIGH;  Surgeon: Rusty Aus, DO;  Location: [facility];  Service: General;  Laterality: Right;   LEFT HEART CATH AND CORONARY ANGIOGRAPHY N/A 07/12/2017   Procedure: LEFT HEART CATH AND CORONARY ANGIOGRAPHY;  Surgeon: Lorretta Harp, MD;  Location: [facility];  Service: Cardiovascular;  Laterality: N/A;    Prior to Admission medications   Medication Sig Start Date End Date Taking? Authorizing Provider  carbamazepine (TEGRETOL) 200 MG tablet Take 200 mg by mouth 2 (two) times daily. 02/19/22  Yes [provider]  DULoxetine (CYMBALTA) 60 MG capsule Take 60 mg by mouth daily.   Yes [provider]  enalapril (VASOTEC) 10 MG tablet Take 10 mg by mouth daily. 02/23/22  Yes [provider]  escitalopram (LEXAPRO) 10 MG tablet Take 10 mg by mouth daily. 02/15/21  Yes [provider]  furosemide (LASIX) 40 MG tablet Take 1 tablet (40 mg total) by mouth daily. Start 11/25/2021 11/25/21 11/25/22 Yes Roxan Hockey, MD  gabapentin (NEURONTIN) 600 MG tablet Take 0.5 tablets (300 mg total) by mouth 3 (three) times daily. Patient taking differently: Take 600 mg by mouth in the morning, at noon, in the evening, and at bedtime. 05/18/21  Yes Kathie Dike, MD  MOUNJARO 5 MG/0.5ML Pen Inject 10 mg into the skin once a week. Thursdays 11/23/21  Yes [provider]  NOVOLOG RELION 100 UNIT/ML injection Inject 4-20 Units into the skin 3 (three) times daily with meals. Per sliding scale:70-130; 0 units, 130 to 180; 4 units; 180-240; 8 units; 241-300 ; 10 units; 301-350;  12 units; 351-400; 16 units. > 400 20 units and call MD 10/31/21  Yes [provider]  pioglitazone (ACTOS) 15 MG tablet Take 15 mg by mouth daily. 03/22/22  Yes [provider]  prazosin (MINIPRESS) 2 MG capsule Take 2 mg by mouth at bedtime. 03/15/21  Yes [provider]  TRESIBA 100 UNIT/ML SOLN Inject 30 mLs into the muscle at bedtime. 05/12/22  Yes [provider]  acetaminophen (TYLENOL) 325 MG tablet Take 2 tablets (650 mg total) by mouth every 6 (six) hours as needed for moderate pain or fever. Patient not taking: Reported on 06/28/2022 10/09/21   Jaynee Eagles, PA-C  amoxicillin (AMOXIL) 500 MG capsule Take 2 capsules (1,000 mg total) by mouth every 8 (eight) hours. Patient not taking: Reported on 06/28/2022 01/22/22   Geradine Girt, DO    Current Facility-Administered Medications  Medication Dose Route Frequency Provider Last Rate Last Admin   insulin aspart (novoLOG) injection 0-20 Units  0-20 Units Subcutaneous TID WC Sanjuan Dame, MD   7 Units at 06/28/22 0858   sodium chloride flush (NS) 0.9 % injection 3 mL  3 mL Intravenous Q12H Sanjuan Dame, MD   3 mL at 06/28/22 5361   Current Outpatient Medications  Medication Sig Dispense Refill   carbamazepine (TEGRETOL) 200 MG tablet Take 200 mg by mouth 2 (two) times daily.     DULoxetine (CYMBALTA) 60 MG capsule Take 60 mg by mouth daily.     enalapril (VASOTEC) 10 MG tablet Take 10 mg by mouth daily.     escitalopram (LEXAPRO) 10 MG tablet Take 10 mg by mouth daily.     furosemide (LASIX) 40 MG tablet Take 1 tablet (40 mg total) by mouth daily. Start 11/25/2021 30 tablet 11   gabapentin (NEURONTIN) 600 MG tablet Take 0.5 tablets (300 mg total) by mouth 3 (three) times daily. (Patient taking differently: Take 600 mg by mouth in the morning, at noon, in the evening, and at bedtime.)     MOUNJARO 5 MG/0.5ML Pen Inject 10 mg into the skin once a week. Thursdays     NOVOLOG RELION 100 UNIT/ML injection  Inject 4-20 Units into the skin 3 (three) times daily with meals. Per sliding scale:70-130; 0 units, 130 to 180; 4 units; 180-240; 8 units; 241-300 ; 10 units; 301-350; 12 units; 351-400; 16 units. > 400 20 units and call MD     pioglitazone (ACTOS) 15 MG tablet Take 15 mg by mouth daily.     prazosin (MINIPRESS) 2 MG capsule Take 2 mg by mouth at bedtime.     TRESIBA 100 UNIT/ML SOLN Inject 30 mLs into the muscle at bedtime.     acetaminophen (TYLENOL) 325 MG tablet Take 2 tablets (650 mg total) by mouth every 6 (six) hours as needed for moderate pain or fever. (Patient not taking: Reported on 06/28/2022) 30 tablet 0   amoxicillin (AMOXIL) 500 MG  capsule Take 2 capsules (1,000 mg total) by mouth every 8 (eight) hours. (Patient not taking: Reported on 06/28/2022) 180 capsule 2    Allergies as of 06/27/2022 - Review Complete 04/03/2022  Allergen Reaction Noted   Ibuprofen Swelling 12/14/2020   Red dye  10/26/2021   Codeine Palpitations    Nitrofurantoin Diarrhea and Nausea And Vomiting 12/14/2020    Family History  Problem Relation Age of Onset   Diabetes Mother    Heart failure Mother    Hypertension Mother    Cancer Mother    CAD Mother 57       Died of MI   Diabetes Father    Alcohol abuse Father     Social History   Socioeconomic History   Marital status: Divorced    Spouse name: Not on file   Number of children: Not on file   Years of education: Not on file   Highest education level: Not on file  Occupational History   Occupation: Disability  Tobacco Use   Smoking status: Never   Smokeless tobacco: Never  Substance and Sexual Activity   Alcohol use: No   Drug use: No   Sexual activity: Yes    Birth control/protection: None  Other Topics Concern   Not on file  Social History Narrative   Not on file   Social Determinants of Health   Financial Resource Strain: Not on file  Food Insecurity: No Food Insecurity (04/03/2022)   Hunger Vital Sign    Worried About  Running Out of Food in the Last Year: Never true    Ran Out of Food in the Last Year: Never true  Transportation Needs: No Transportation Needs (04/03/2022)   PRAPARE - Hydrologist (Medical): No    Lack of Transportation (Non-Medical): No  Physical Activity: Not on file  Stress: Not on file  Social Connections: Not on file  Intimate Partner Violence: Not At Risk (04/03/2022)   Humiliation, Afraid, Rape, and Kick questionnaire    Fear of Current or Ex-Partner: No    Emotionally Abused: No    Physically Abused: No    Sexually Abused: No    Review of Systems: ROS is O/W negative  Physical Exam: Vital signs in last 24 hours: Temp:  [98 F (36.7 C)-99.8 F (37.7 C)] 99.8 F (37.7 C) (12/06 1255) Pulse Rate:  [48-110] 106 (12/06 1430) Resp:  [14-23] 23 (12/06 1430) BP: (92-146)/(39-89) 122/63 (12/06 1430) SpO2:  [88 %-100 %] 100 % (12/06 1430) Weight:  [113.4 kg] 113.4 kg (12/05 2256) Last BM Date : 06/28/22 General:  Alert, Well-developed, well-nourished, pleasant and cooperative in NAD Head:  Normocephalic and atraumatic. Eyes:  Sclera clear, no icterus.  Conjunctiva pink. Ears:  Normal auditory acuity. Mouth:  No deformity or lesions.   Lungs:  Clear throughout to auscultation.  No wheezes, crackles, or rhonchi.  Heart:  Regular rate and rhythm; no murmurs, clicks, rubs, or gallops. Abdomen:  Soft, non-distended.  BS present.  Lower abdominal TTP.  Msk:  Symmetrical without gross deformities. Pulses:  Normal pulses noted. Extremities:  Without clubbing or edema. Neurologic:  Alert and oriented x 4;  grossly normal neurologically. Skin:  Intact without significant lesions or rashes. Psych:  Alert and cooperative. Normal mood and affect.  Intake/Output from previous day: 12/05 0701 - 12/06 0700 In: 1998 [IV Piggyback:1998] Out: -  Intake/Output this shift: Total I/O In: -  Out: 2000 [Urine:2000]  Lab Results: Recent Labs  06/27/22 2249  WBC 11.0*  HGB 12.6  HCT 38.3  PLT 216   BMET Recent Labs    06/27/22 2249  NA 138  K 3.9  CL 102  CO2 24  GLUCOSE 212*  BUN 40*  CREATININE 1.87*  CALCIUM 9.1   LFT Recent Labs    06/27/22 2249  PROT 7.0  ALBUMIN 3.4*  AST 29  ALT 19  ALKPHOS 132*  BILITOT 0.9   Studies/Results: CT ABDOMEN PELVIS W CONTRAST  Result Date: 06/28/2022 CLINICAL DATA:  Syncope.  Abdominal pain, acute, nonlocalized EXAM: CT ABDOMEN AND PELVIS WITH CONTRAST TECHNIQUE: Multidetector CT imaging of the abdomen and pelvis was performed using the standard protocol following bolus administration of intravenous contrast. RADIATION DOSE REDUCTION: This exam was performed according to the departmental dose-optimization program which includes automated exposure control, adjustment of the mA and/or kV according to patient size and/or use of iterative reconstruction technique. CONTRAST:  62m OMNIPAQUE IOHEXOL 350 MG/ML SOLN COMPARISON:  02/06/2022 FINDINGS: Lower chest: No acute abnormality. Hepatobiliary: No focal liver abnormality is seen. Status post cholecystectomy. No biliary dilatation. Pancreas: No focal abnormality or ductal dilatation. Spleen: No focal abnormality.  Normal size. 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 Partially enhancing mass seen within the lower pole of the left kidney measuring 4.5 cm compared to 2.8 cm previously concerning for renal cell carcinoma. No stones or hydronephrosis. Urinary bladder unremarkable. Stomach/Bowel: Stomach, large and small bowel grossly unremarkable. Vascular/Lymphatic: Aortic atherosclerosis. No evidence of aneurysm or adenopathy. Reproductive: Prior hysterectomy.  No adnexal masses. Other: No free fluid or free air. Musculoskeletal: No acute bony abnormality. IMPRESSION: 4.5 cm enhancing lesion in the lower pole of the left kidney, enlarging from 2.8 cm previously.  This is concerning for renal cell carcinoma. Recommend urology consultation and further characterization with renal protocol MRI. Aortic atherosclerosis. Electronically Signed   By: KRolm BaptiseM.D.   On: 06/28/2022 03:01   CT Head Wo Contrast  Result Date: 06/28/2022 CLINICAL DATA:  Mental status change, unknown cause. Syncopal episode. EXAM: CT HEAD WITHOUT CONTRAST TECHNIQUE: Contiguous axial images were obtained from the base of the skull through the vertex without intravenous contrast. RADIATION DOSE REDUCTION: This exam was performed according to the departmental dose-optimization program which includes automated exposure control, adjustment of the mA and/or kV according to patient size and/or use of iterative reconstruction technique. COMPARISON:  05/16/2021. FINDINGS: Brain: No acute intracranial hemorrhage, midline shift or mass effect. No extra-axial fluid collection. Gray-white matter differentiation is within normal limits. No hydrocephalus. Vascular: No hyperdense vessel or unexpected calcification. Skull: Normal. Negative for fracture or focal lesion. Sinuses/Orbits: Mild mucosal thickening in the left maxillary sinus. No acute orbital abnormality. Other: None. IMPRESSION: No acute intracranial process. Electronically Signed   By: LBrett FairyM.D.   On: 06/28/2022 03:00    IMPRESSION:  *GI bleed: Sounds lower in origin.  Originally presented with a syncopal episode and then the bleeding started when she got to the emergency department.  Apparently has a history of polyps on colonoscopy performed 6 years ago and was told to have a year follow-up, but she never followed through with that.  Could be diverticular bleed, CT scan does not show any diverticular disease on this occasion but it is mentioned on a CT scan from July.  We do not have records from previous colonoscopy.  Of note, I was informed by the medicine service that radiologist did an over read of  her CT scan saying that she may have  some mild colonic wall thickening of the sigmoid colon equivocal but possible distal colitis.  Stool studies negative.  Question ischemic colitis as she does have some lower abdominal pain as well. *Renal mass seen on CT scan concerning for renal cell carcinoma.  Is following with urology and had ablation last week.  PLAN: -Will start abx in the form of Zosyn for GI coverage. -Monitor hemoglobin and transfuse if needed. -Will plan to prep her for colonoscopy tomorrow if able to get her cleaned out enough.  Laban Emperor. Zehr  06/28/2022, 4:11 PM    Attending physician's note  I have taken a history, reviewed the chart and examined the patient. I performed a substantive portion of this encounter, including complete performance of at least one of the key components, in conjunction with the APP. I agree with the APP's note, impression and recommendations.    61 year old female presented with syncopal episode, had a large volume episode of hematochezia concerning for acute diverticular hemorrhage Left lower quadrant tenderness on exam  Recently underwent ablation for renal cell carcinoma in left kidney last week CT abdomen pelvis showed sigmoid diverticulosis with associated colon wall thickening concerning for segmental colitis/diverticulitis Start Zosyn Clear liquids  Bowel prep, will plan for colonoscopy tomorrow a.m.  If develops recurrent large-volume hematochezia with hemodynamic instability, please obtain CT angio to localize the source of bleed and IR embolization   The patient was provided an opportunity to ask questions and all were answered. The patient agreed with the plan and demonstrated an understanding of the instructions.  Damaris Hippo , MD [phone]

## 2022-06-28 NOTE — ED Notes (Signed)
Concern regarding lab work sent to provider at this time regarding trending CBC.

## 2022-06-28 NOTE — ED Notes (Signed)
Patient ordered LR bolus placed on pressure bag at this time. Patient placed in trendelenburg position at this time. Admitting MD's at bedside to evaluate patient. Patient answering questions appropriately.

## 2022-06-28 NOTE — ED Notes (Signed)
Nurse attempted bladder scan on patient. Bladder scanner malfunctioning. RN will attempt to attain another.

## 2022-06-28 NOTE — ED Notes (Signed)
Patient called out saying she needed to be cleaned up. Stool appeared to be bloody notified MD.

## 2022-06-28 NOTE — ED Notes (Signed)
Admitting team at bedside to assess patient. Patient had another episode of bright red stool with providers at bedside.

## 2022-06-28 NOTE — Progress Notes (Addendum)
PT Cancellation Note  Patient Details Name: [name] MRN: [id number] DOB: 1960/10/28   Cancelled Treatment:    Reason Eval/Treat Not Completed: Medical issues which prohibited therapy.  Cx due to drops in BP, new tremors and GI bleeding.  Will recheck and see if pt is more medically stable at another time.   Ramond Dial 06/28/2022, 1:06 PM  Mee Hives, PT PhD Acute Rehab Dept. Number: [place] and [place]

## 2022-06-28 NOTE — ED Notes (Signed)
Provider made aware of patient's systolic BP in the 48T. Patient alert and answering questions but lethargic. Per provider hang LR bolus at this time.

## 2022-06-28 NOTE — ED Notes (Signed)
Pt had 4-5 bowel movements while RN was attempting to check patient in. Clothing, linens and sheets changed. Daughter at bedside.

## 2022-06-28 NOTE — ED Notes (Signed)
This RN was able to successfully in and out cath patient with assistance from [place] ED Tech. Patient's output was 900 mL. Will notify provider of same.

## 2022-06-28 NOTE — Progress Notes (Signed)
Notified by ED RN that patient had BP of 72/48 with 1 bloody BM an hour prior. At bedside the patient is in trendelenburg with IVF running and BP back up to 98/46 or MAP of 61. She reports being lethargic but is A&Ox4 with CN 2-12 intact. Her pupils are normal size but minimally responsive bilaterally. Suspect this is in the setting of the bright room and not a feature of cerebral hypoperfusion in isolation. Denies headache, vision changes, CP,SOB, nausea. At the time of the event she had a HR in the 90s not consistent with vasovagal. This is likely volume depletion in the setting of diarrhea and GI blood loss. Will continue to monitor and administering fluids.

## 2022-06-28 NOTE — Progress Notes (Addendum)
Secure messaged by RN about patient having another large volume bloody bowel movement.   Patient seen and evaluated at bedside. She reports no pain in rectum, does continue to endorse same diffuse abdominal pain with focus lower abdomen as prior. Reports being scared because she has never had anything like this before.   PE:  Alert, anxious Heart: RRR on tele, normotensive Breathing comfortably room air GI: no obvious external hemorrhoids, DRE performed with no palpable lesions, tears, or masses. Expulsion of blood after removing gloved finger.   A/P: She endorses is a history of prior polyps noted on prior colonoscopy performed 6 years ago.  1 year follow-up was recommended at the time but she never followed through with this.  Given this history there is concern for bleeding malignancy.  Bleeding diverticulitis also remains on the differential. GI panel and Cdiff negative making infectious colitis less likely. No obvious hemorrhoids palpated, therefore bleeding hemorrhoid seems unlikely as well. - Will consult GI for further assistance. - Checking CBC - DVT prophylaxis has already been stopped. - Patient has already consented for blood and we can transfuse as necessary. - type and screen

## 2022-06-28 NOTE — Progress Notes (Addendum)
HD#0 SUBJECTIVE:  Patient Summary: [name] is a 61 y.o. with a pertinent PMH of hypertension, diabetes type 2, CKD 3, who presented with syncope and admitted for syncope.   Overnight Events: Patient developed diarrhea with overt hematochezia    Interm History: Patient reporting continued abdominal pain.  Discussed that she was profoundly dehydrated and we are replacing fluids with her.  She states that she has been taking her Lasix usually every day and does so when she does not feel she is urinating enough.  Without Lasix typically urinates normal volume once daily.  With Lasix she says she urinates large volumes 3-4 times daily.  OBJECTIVE:  Vital Signs: Vitals:   06/28/22 1115 06/28/22 1130 06/28/22 1204 06/28/22 1255  BP: (!) 109/56 (!) 146/65  (!) 112/52  Pulse: (!) 48 (!) 108  (!) 107  Resp:  18  (!) 21  Temp:   99.6 F (37.6 C) 99.8 F (37.7 C)  TempSrc:   Oral Oral  SpO2: (!) 88% 95%  98%  Weight:      Height:       Supplemental O2:  SpO2: 98 %  Filed Weights   06/27/22 2256  Weight: 113.4 kg     Intake/Output Summary (Last 24 hours) at 06/28/2022 1405 Last data filed at 06/28/2022 0541 Gross per 24 hour  Intake 1998 ml  Output --  Net 1998 ml   Net IO Since Admission: 1,998 mL [06/28/22 1405]  Physical Exam: Gen: obese, lethargic, non-toxic, no acute distress, obese CV: RRR,normal s1/s2, no m/r/g Pulm: normal wob, LCTAB at the anterior fields on RA Abd: soft, tender to palpation RLQ/suprapubic/ Extremities: warm, dry, no LE, 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 01/14/22  0127  -- 165   Wound / Incision (Open or Dehisced) 01/14/22 Venous stasis ulcer Foot Anterior;Right 01/14/22  0127  Foot  165            Pertinent Labs:    Latest Ref Rng & Units 06/27/2022   10:49 PM 04/05/2022    3:37 AM 04/04/2022    4:01 AM  CBC  WBC 4.0 - 10.5 K/uL 11.0  11.5  11.3   Hemoglobin 12.0 - 15.0 g/dL 12.6  10.4  10.3   Hematocrit 36.0 - 46.0 % 38.3  33.1  32.2   Platelets 150 - 400 K/uL 216  171  142        Latest Ref Rng & Units 06/27/2022   10:49 PM 04/05/2022    3:37 AM 04/04/2022    4:01 AM  CMP  Glucose 70 - 99 mg/dL 212  109  103   BUN 6 - 20 mg/dL 40  38  59   Creatinine 0.44 - 1.00 mg/dL 1.87  1.31  1.86   Sodium 135 - 145 mmol/L 138  139  139  Potassium 3.5 - 5.1 mmol/L 3.9  3.9  3.7   Chloride 98 - 111 mmol/L 102  114  113   CO2 22 - 32 mmol/L '24  23  22   '$ Calcium 8.9 - 10.3 mg/dL 9.1  7.9  7.6   Total Protein 6.5 - 8.1 g/dL 7.0   5.6   Total Bilirubin 0.3 - 1.2 mg/dL 0.9   0.4   Alkaline Phos 38 - 126 U/L 132   71   AST 15 - 41 U/L 29   11   ALT 0 - 44 U/L 19   12     Recent Labs    06/28/22 0603 06/28/22 0801 06/28/22 1255  GLUCAP 206* 225* 112*     Pertinent Imaging: CT ABDOMEN PELVIS W CONTRAST  Result Date: 06/28/2022 CLINICAL DATA:  Syncope.  Abdominal pain, acute, nonlocalized EXAM: CT ABDOMEN AND PELVIS WITH CONTRAST TECHNIQUE: Multidetector CT imaging of the abdomen and pelvis was performed using the standard protocol following bolus administration of intravenous contrast. RADIATION DOSE REDUCTION: This exam was performed according to the departmental dose-optimization program which includes automated exposure control, adjustment of the mA and/or kV according to patient size and/or use of iterative reconstruction technique.  CONTRAST:  69m OMNIPAQUE IOHEXOL 350 MG/ML SOLN COMPARISON:  02/06/2022 FINDINGS: Lower chest: No acute abnormality. Hepatobiliary: No focal liver abnormality is seen. Status post cholecystectomy. No biliary dilatation. Pancreas: No focal abnormality or ductal dilatation. Spleen: No focal abnormality.  Normal size. 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 Partially enhancing mass seen within the lower pole of the left kidney measuring 4.5 cm compared to 2.8 cm previously concerning for renal cell carcinoma. No stones or hydronephrosis. Urinary bladder unremarkable. Stomach/Bowel: Stomach, large and small bowel grossly unremarkable. Vascular/Lymphatic: Aortic atherosclerosis. No evidence of aneurysm or adenopathy. Reproductive: Prior hysterectomy.  No adnexal masses. Other: No free fluid or free air. Musculoskeletal: No acute bony abnormality. IMPRESSION: 4.5 cm enhancing lesion in the lower pole of the left kidney, enlarging from 2.8 cm previously. This is concerning for renal cell carcinoma. Recommend urology consultation and further characterization with renal protocol MRI. Aortic atherosclerosis. Electronically Signed   By: KRolm BaptiseM.D.   On: 06/28/2022 03:01   CT Head Wo Contrast  Result Date: 06/28/2022 CLINICAL DATA:  Mental status change, unknown cause. Syncopal episode. EXAM: CT HEAD WITHOUT CONTRAST TECHNIQUE: Contiguous axial images were obtained from the base of the skull through the vertex without intravenous contrast. RADIATION DOSE REDUCTION: This exam was performed according to the departmental dose-optimization program which includes automated exposure control, adjustment of the mA and/or kV according to patient size and/or use of iterative reconstruction technique. COMPARISON:  05/16/2021. FINDINGS: Brain: No acute intracranial hemorrhage, midline shift or mass effect. No extra-axial fluid collection. Gray-white  matter differentiation is within normal limits. No hydrocephalus. Vascular: No hyperdense vessel or unexpected calcification. Skull: Normal. Negative for fracture or focal lesion. Sinuses/Orbits: Mild mucosal thickening in the left maxillary sinus. No acute orbital abnormality. Other: None. IMPRESSION: No acute intracranial process. Electronically Signed   By: LBrett FairyM.D.   On: 06/28/2022 03:00    ASSESSMENT/PLAN:  Assessment: Principal Problem:   Syncope  Syncope Orthostatic hypotension Syncopal event likely multifactorial between orthostasis and vasovagal event.  She was profoundly dehydrated on admission, with positive orthostatics status post 3 L fluid resuscitation.  We are continuing fluids today.  Dehydration likely due to medication misuse as patient had been using  Lasix and enalapril despite medication discontinuation. - Continue fluid resuscitation - DC telemetry - PT OT   Acute on chronic KD 3a Urinary retention - likely autonomic dysfunction Fully prerenal in the setting of profound dehydration.  Anticipate improvement with IV fluids Patient does have urinary retention, likely autonomic neuropathy related to poorly controlled diabetes.  She has already undergone 1 in and out cath with 900 mL yield. She likely has overflow incontinence when using lasix at home.  - Fluid resuscitation - Daily BMP - Place Foley    Hematochezia, BRBPR Abdominal pain New onset bloody diarrhea/hematochezia with no prior history of GI bleed.  Reports colonoscopy performed in [place] 6 years ago with polyps seen on exam and was advised to return in 1 year for repeat colonoscopy which she never completed.   Patient typically constipated.  She has prior evidence of diverticulosis noted in sigmoid colon on CT scan.  Interestingly no note of that on this admission CT scan. Discussed with on-call radiologist who did an overread of patient's CT scan that she had earlier this morning. He noted the known  diverticular disease, and mild colonic wall thickening of the sigmoid colon equivocal but possible distal colitis  There is also concern for possible diverticulitis with diverticular bleed. GI panel and C. difficile has been negative.  No palpable hemorrhoidal masses on DRE.  Infectious colitis and hemorrhoidal bleeding seem unlikely at this time. - Stop DVT prophylaxis - Type and screen - P.m. CBC - Consult GI.  HTN Hypotensive, likely due to dehydration.  Holding home medicines.  Continue to monitor.   T2DM Home regimen includes glargine 30u nightly per the patient, SSI with meals, mounjaro.  -SSI   Left RCC Following with [name] IR at  [facility] for this. Cryoablation performed 11/29. Ct this admission shows interval increase in size of the mass from 2.8 cm to 4.5 cm; possible sequelae of the ablation. Continue to follow with IR outpatient.    Dispo: Admit patient to Inpatient with expected length of stay greater than 2 midnights. Fluids: LR bolus Code: full DVT: lovenox  Delene Ruffini, MD

## 2022-06-28 NOTE — ED Notes (Signed)
Patient complaining of headache. This RN sent message to admitting provider requesting some medication for a headache at this time.

## 2022-06-28 NOTE — Progress Notes (Signed)
Pharmacy Antibiotic Note  [name] is a 61 y.o. female admitted on 06/27/2022 with hematochezia.  Pharmacy has been consulted for Zosyn dosing.  CrCl 30-40 mL/min  Plan: Zosyn 3.375g Q8H  F/u GI recommendations and colonoscopy results  Height: '5\' 2"'$  (157.5 cm) Weight: 113.4 kg (250 lb) IBW/kg (Calculated) : 50.1  Temp (24hrs), Avg:98.9 F (37.2 C), Min:98 F (36.7 C), Max:99.8 F (37.7 C)  Recent Labs  Lab 06/27/22 2249 06/28/22 1526  WBC 11.0* 9.4  CREATININE 1.87*  --     Estimated Creatinine Clearance: 38.6 mL/min (A) (by C-G formula based on SCr of 1.87 mg/dL (H)).    Allergies  Allergen Reactions   Ibuprofen Swelling   Red Dye     Other reaction(s): Nervous syncope   Codeine Palpitations   Nitrofurantoin Diarrhea and Nausea And Vomiting    Antimicrobials this admission: Zosyn 12/6 >>  Microbiology results: 12/6 GI PCR: negative 12/6 Cdiff: negative  Thank you for allowing pharmacy to be a part of this patient's care.  Merrilee Jansky, PharmD Clinical Pharmacist 06/28/2022 4:56 PM

## 2022-06-28 NOTE — H&P (Addendum)
Date: 06/28/2022               Patient Name:  [name] MRN: [id number]  DOB: 01-May-1961 Age / Sex: 61 y.o., female   PCP: Teressa Senter, FNP         Medical Service: Internal Medicine Teaching Service         Attending Physician: Dr. Joni Reining, DO    First Contact: Dr. Delene Ruffini, MD  Pager: [phone]  Second Contact: Dr. Sanjuana Letters, DO  Pager: [phone]       After Hours (After 5p/  First Contact Pager: [phone]  weekends / holidays): Second Contact Pager: [phone]   Chief Complaint: syncope  History of Present Illness:  [name] is a 61 y/o female with a pmh of HTN, T2DM, MDD,CKD 3 who presents for syncope. She says that she felt the urge to have a bowel movement and was wheeled to the bathroom by her daughter. She says after that she cannot remember anything ending up in the ED. Per chart review it appears the daughter told the ED that the patient syncopized while having a bowel movement. The patient says this has happened multiple times before and it really only happens while straining to have a bowel movement. She said she did have one earlier yesterday but it was just little hard balls and it had been days before since she had a bowel movement. She says normally when it happens she get lightheaded/dizzy, her vision get blurry, she gets hot, diaphoretic, feels palpitations and passes out. She says she never urinates on herself and does not feel as if she bit her tongue. She denies history of seizures or arrhythmias. She denies any headache, CP,SOB.She reports taking lasix and enalapril for BP but does not check her BP at home. She denies history of heart failure. Additionally the patient says she had diarrhea that started in the ED. She says no one else in her family who ate at [employer] has this. She has accompanying abdominal pain in a band like distribution across the lower quadrant.   ED Course: Initially presented to ED after syncopal episode  while trying to have a bowel movement in [employer] and found to have systolic BP of 70 by EMS. Systolics improved to 322 with 700cc IV fluid bolus en route to the ED. Per chart review appears patients BP meds were lowered however patient continues to take prior dosing. Meds:  Current Outpatient Medications  Medication Instructions   acetaminophen (TYLENOL) 650 mg, Oral, Every 6 hours PRN   amoxicillin (AMOXIL) 1,000 mg, Oral, Every 8 hours   carbamazepine (TEGRETOL) 200 mg, Oral, 2 times daily   DULoxetine (CYMBALTA) 60 mg, Oral, 2 times daily   enalapril (VASOTEC) 10 mg, Oral, Daily   escitalopram (LEXAPRO) 10 mg, Oral, Daily   furosemide (LASIX) 40 mg, Oral, Daily, Start 11/25/2021   gabapentin (NEURONTIN) 300 mg, Oral, 3 times daily   Mounjaro 10 mg, Subcutaneous, Weekly   NovoLOG ReliOn 4-20 Units, Subcutaneous, 3 times daily with meals, Per sliding scale:70-130; 0 units, 130 to 180; 4 units; 180-240; 8 units; 241-300 ; 10 units; 301-350; 12 units; 351-400; 16 units. > 400 20 units and call MD   prazosin (MINIPRESS) 2 mg, Oral, Daily at bedtime       Allergies: Allergies as of 06/27/2022 - Review Complete 04/03/2022  Allergen Reaction Noted   Ibuprofen Swelling 12/14/2020   Red dye  10/26/2021   Codeine Palpitations  Nitrofurantoin Diarrhea and Nausea And Vomiting 12/14/2020   Past Medical History:  Diagnosis Date   Chest pain    a. 06/2017: cath showing normal cors with a preserved EF of 55-65%.    Depression    Diabetes mellitus    Hypertension    Neuropathy    Pneumonia    Renal disorder    Sepsis ([place])     Family History:  Family History  Problem Relation Age of Onset   Diabetes Mother    Heart failure Mother    Hypertension Mother    Cancer Mother    CAD Mother 31       Died of MI   Diabetes Father    Alcohol abuse Father      Social History:  Lives at home alone, currently on disability for income. Requires a wheelchair at home and has a bedside  commode that sits beside her recliner. She needs help with driving and shopping. She mainly buys TV dinners so does not have to do much cooking or cleaning. She manages her own finances and is able to bath and restroom by herself. Her daughter lives close and was a Marine scientist at [employer] so helps her out a lot. She denies alcohol or tobacco use.  Review of Systems: A complete ROS was negative except as per HPI.   Physical Exam: Blood pressure (!) 117/54, pulse 94, temperature 98 F (36.7 C), temperature source Axillary, resp. rate 19, height '5\' 2"'$  (1.575 m), weight 113.4 kg, SpO2 100 %. Gen: obese, lethargic, non-toxic, no acute distress HEENT:Conjunctival pallor CV:RRR,normal s1/s2, no m/r/g, 2+ bilateral radial and pedal pulses Pulm: normal wob, LCTAB at the anterior fields on RA Abd: soft, ptp of the RLQ, ND, no rebound or guarding, no palpable masses Extremities: warm, dry, no LE, charcot deformity of the L foot, healing ulcer on the plantar aspect of the R foot Neuro: A&Ox4, CN 2-12 grossly intact  EKG: personally reviewed my interpretation is NSR, no acute ST/T wave changes, poor R wave progression  Assessment & Plan by Problem: Active Problems:   * No active hospital problems. * [name] is a 61 y/o female with a pmh of HTN, T2DM, MDD,CKD 3 who presents for syncope.  Vasovagal syncope Hypotension Patient syncopized in [employer] while attempting to have BM but does not recall anything just prior to the syncope or after, until arriving to the ED. CT head negative for acute fracture or intracranial bleed. Patient says this has happened multiple times in the past and only while straining to have BMs, never while standing up. Normally it is accompanied by a preceding lightheadedness, blurry vision, diaphoresis, warmth, palpitations.No history of seizures and no reported urinary incontinence by family or muscle jerking/twitching. Patient denies tongue biting. Although she was confused  following the event this is not consistent with seizures. EKG is normal sinus rhythm with no acute ischemic changes and troponins wnl. Do not suspect arrhythmia given no history of this and given the consistent situation aspect of her syncope. This is also less consistent with orthostatic syncope as it does not occur with positional changes. Given that this has happened multiple times and only while straining to use the restroom and given the accompanying symptoms, suspect this is in part due to vasovagal syncope. The patient was also found to have hypotension to the 48A systolic with EMS and has continued to have low BP to the 90s and low 165V systolic in the ED. I also suspect this is  playing a role and per chart review it appears she has been told to stop taking BP meds in the past due to hypotension but continues to take them. Now status post 3L LR bolus. -PT/OT -orthostatic vitals -Tele -discontinue lasix '40mg'$  daily -Hold enalapril '10mg'$  dialy  CKD 3a Creatinine found to be 1.87 on admission with BUN of 40. BUN:Cr of 21.39 suggestive of prerenal AKI most likely in the setting of  hypotension and dehydration from lasix. Baseline is unclear but was 1.31 most recently 04/05/2022. Appears to fall between 1.3-1.5 at baseline. S/p 3L LR bolus. -daily bmp -avoid nephrotoxic medications  Diarrhea RLQ abdominal pain Patient reports having new diarrhea that started in the ED. The nurse says a total of 8 episodes that wash initially mushy and transitioned to liquid. She described them as brown with red specs. Has had constipation over the prior days but no evidence of constipation on CT A/P to suggest overflow incontinence. Also no evidence of bowel wall thickening,intraabdominal fluid or inflammatory stranding and edema to suggest intraabdominal pathology. This is likely a viral gastroenteritis which could explain her WBC elevation to 11.0, could also be an acute food poisoning from [employer] but less  likely given no N/V. She was found to be C diff negative. -F/u GI pathogen panel  HTN Home regimen includes lasix 40 and enalapril 10. Will hold both in the setting of hypotension, syncope, AKI.  T2DM Home regimen includes glargine 30u nightly per the patient, SSI with meals, mounjaro. BG 212 on arrival will start with SSI for now and can reevaluate starting basal insulin based on day time oral intake. -SSI  Left RCC Following with [name] IR at  [facility] for this. Cryoablation performed 11/29. CT today shows interval increase in size of the mass from 2.8 cm to 4.5 cm. Suspect this is sequelae of the ablation. Continue to follow with IR outpatient.  MDD Records suggest she is taking lexipro and duloxetine. Will need to contact daughter to verify home medications.   Dispo: Admit patient to Inpatient with expected length of stay greater than 2 midnights.  Signed: Iona Coach, MD 06/28/2022, 4:37 AM  Pager: [phone] After 5pm on weekdays and 1pm on weekends: On Call pager: [phone]

## 2022-06-28 NOTE — ED Notes (Signed)
Prior to this RN inserting ordered foley catheter, patient had 1 episode of large liquid bright red bloody stool. Admitting team made aware of same. Patient cleaned up by this RN and [place] ED tech and provided with clean linens.

## 2022-06-28 NOTE — ED Provider Notes (Signed)
[facility] EMERGENCY DEPARTMENT Provider Note   CSN: [id number] Arrival date & time: 06/27/22  2245     History  Chief Complaint  Patient presents with   Loss of Consciousness    [name] is a 61 y.o. female.  61 year old female presents today for evaluation of syncopal episode while trying to have a bowel movement.  She was at [employer] when this happened.  Daughter was with her.  She has history of stroke vasovagal responses in similar settings as well as dehydration requiring admissions in the past.  Initial history is obtained by EMS who state patient's initial pressures were 70s.  By the time patient arrived to ED she had received about 700 cc and BP had improved to 157 systolic.  Patient on arrival to the emergency room is somnolent, and confused.  She is oriented to self and is able to give me some accurate context to what lead to her to come to the ED tonight.  She denies chest pain, shortness of breath, palpitations, headache or other complaints.  Daughter later presents to bedside.  She states this is typical for her following these episodes.  States that she gets confused, very sleepy.  Most recent episode was 2 months ago.  Daughter states patient's blood pressure medication was discontinued however patient continues to take this.  Daughter states most recently she took this this morning.  She is unsure of the name of the medicine.  She is also more Mounjaro and has lost a lot of weight.  Daughter is concerned she is also have decreased p.o. intake.  Per daughter patient has been complaining of abdominal pain for the past 2 weeks intermittently.  She takes stool softeners, but remains constipated.  Patient later on became oriented.  She adds she follows with a urologist for concern of renal cell carcinoma and recently had a procedure done where this concerning area was "frozen".  The history is provided by the patient, the EMS personnel and a  relative.       Home Medications Prior to Admission medications   Medication Sig Start Date End Date Taking? Authorizing Provider  acetaminophen (TYLENOL) 325 MG tablet Take 2 tablets (650 mg total) by mouth every 6 (six) hours as needed for moderate pain or fever. 10/09/21   Jaynee Eagles, PA-C  amoxicillin (AMOXIL) 500 MG capsule Take 2 capsules (1,000 mg total) by mouth every 8 (eight) hours. 01/22/22   Geradine Girt, DO  carbamazepine (TEGRETOL) 200 MG tablet Take 200 mg by mouth 2 (two) times daily. 02/19/22   [provider]  DULoxetine (CYMBALTA) 60 MG capsule Take 60 mg by mouth 2 (two) times daily.     [provider]  enalapril (VASOTEC) 10 MG tablet Take 10 mg by mouth daily. 02/23/22   [provider]  escitalopram (LEXAPRO) 10 MG tablet Take 10 mg by mouth daily. 02/15/21   [provider]  furosemide (LASIX) 40 MG tablet Take 1 tablet (40 mg total) by mouth daily. Start 11/25/2021 11/25/21 11/25/22  Roxan Hockey, MD  gabapentin (NEURONTIN) 600 MG tablet Take 0.5 tablets (300 mg total) by mouth 3 (three) times daily. Patient taking differently: Take 600 mg by mouth in the morning, at noon, in the evening, and at bedtime. 05/18/21   Kathie Dike, MD  MOUNJARO 5 MG/0.5ML Pen Inject 10 mg into the skin once a week. 11/23/21   [provider]  NOVOLOG RELION 100 UNIT/ML injection Inject 4-20 Units  into the skin 3 (three) times daily with meals. Per sliding scale:70-130; 0 units, 130 to 180; 4 units; 180-240; 8 units; 241-300 ; 10 units; 301-350; 12 units; 351-400; 16 units. > 400 20 units and call MD 10/31/21   [provider]  prazosin (MINIPRESS) 2 MG capsule Take 2 mg by mouth at bedtime. 03/15/21   [provider]      Allergies    Ibuprofen, Red dye, Codeine, and Nitrofurantoin    Review of Systems   Review of Systems  Constitutional:  Negative for chills and fever.  Respiratory:  Negative for shortness of breath.    Cardiovascular:  Negative for chest pain.  Gastrointestinal:  Positive for abdominal pain and constipation. Negative for nausea and vomiting.  Neurological:  Positive for syncope.  All other systems reviewed and are negative.   Physical Exam Updated Vital Signs BP 125/89 (BP Location: Right Arm)   Pulse 65   Resp 20   Ht _0  (1.575 m)   Wt 113.4 kg   SpO2 97%   BMI 45.73 kg/m  Physical Exam Vitals and nursing note reviewed.  Constitutional:      General: She is not in acute distress.    Appearance: Normal appearance. She is not ill-appearing.  HENT:     Head: Normocephalic and atraumatic.     Nose: Nose normal.  Eyes:     General: No scleral icterus.    Extraocular Movements: Extraocular movements intact.     Conjunctiva/sclera: Conjunctivae normal.  Cardiovascular:     Rate and Rhythm: Normal rate and regular rhythm.     Pulses: Normal pulses.  Pulmonary:     Effort: Pulmonary effort is normal. No respiratory distress.     Breath sounds: Normal breath sounds. No wheezing or rales.  Abdominal:     General: There is no distension.     Tenderness: There is abdominal tenderness. There is guarding. There is no rebound.  Musculoskeletal:        General: Normal range of motion.     Cervical back: Normal range of motion.     Right lower leg: No edema.     Left lower leg: No edema.  Skin:    General: Skin is warm and dry.  Neurological:     General: No focal deficit present.     Mental Status: She is alert. Mental status is at baseline.     ED Results / Procedures / Treatments   Labs (all labs ordered are listed, but only abnormal results are displayed) Labs Reviewed  CBC WITH DIFFERENTIAL/PLATELET - Abnormal; Notable for the following components:      Result Value   WBC 11.0 (*)    Abs Immature Granulocytes 0.16 (*)    All other components within normal limits  COMPREHENSIVE METABOLIC PANEL - Abnormal; Notable for the following components:   Glucose, Bld 212  (*)    BUN 40 (*)    Creatinine, Ser 1.87 (*)    Albumin 3.4 (*)    Alkaline Phosphatase 132 (*)    GFR, Estimated 31 (*)    All other components within normal limits  GASTROINTESTINAL PANEL BY PCR, STOOL (REPLACES STOOL CULTURE)  C DIFFICILE QUICK SCREEN W PCR REFLEX    MAGNESIUM  BRAIN NATRIURETIC PEPTIDE  TROPONIN I (HIGH SENSITIVITY)  TROPONIN I (HIGH SENSITIVITY)    EKG EKG Interpretation  Date/Time:  Tuesday June 27 2022 23:02:16 EST Ventricular Rate:  85 PR Interval:  153 QRS Duration: 96 QT  Interval:  397 QTC Calculation: 473 R Axis:   -19 Text Interpretation: Sinus rhythm Borderline left axis deviation Low voltage, precordial leads Consider anterior infarct When compared with ECG of 04/03/2022, No significant change was found Confirmed by Delora Fuel ([id number]) on 06/27/2022 11:05:32 PM  Radiology No results found.  Procedures .Critical Care  Performed by: Evlyn Courier, PA-C Authorized by: Evlyn Courier, PA-C   Critical care provider statement:    Critical care time (minutes):  45   Critical care was necessary to treat or prevent imminent or life-threatening deterioration of the following conditions:  Shock and dehydration   Critical care was time spent personally by me on the following activities:  Development of treatment plan with patient or surrogate, discussions with consultants, evaluation of patient's response to treatment, examination of patient, ordering and review of laboratory studies, ordering and review of radiographic studies, ordering and performing treatments and interventions, pulse oximetry, re-evaluation of patient's condition and review of old charts   Care discussed with: admitting provider       Medications Ordered in ED Medications  lactated ringers bolus 1,000 mL (1,000 mLs Intravenous New Bag/Given 06/27/22 2330)  lactated ringers bolus 1,000 mL (1,000 mLs Intravenous New Bag/Given 06/28/22 0028)    ED Course/ Medical Decision Making/  A&P Clinical Course as of 06/28/22 0526  Wed Jun 28, 2022  0034 Patient has received about 400 cc of fluid so far.  Hypotensive to systolic of 71G and MAP of mid 50s.  Just prior to hypotension the patient had 4 large episodes of diarrhea.  Advised nursing to give another liter of fluid bolus, he was pressure bag if needed.  GI stool panel, C. difficile ordered.  CT abdomen pelvis ordered.  CT head ordered due to persistent somnolence, and confusion. CBC and CMP has resulted.  CBC shows mild leukocytosis but no left shift.  Normal hemoglobin.  CMP shows creatinine of 1.87, glucose 212, alk phos of 132 otherwise without acute findings.trop 4. Mag 2.2. [AA]    Clinical Course User Index [AA] Evlyn Courier, PA-C                           Medical Decision Making Amount and/or Complexity of Data Reviewed Labs: ordered. Radiology: ordered.  Risk Prescription drug management. Decision regarding hospitalization.   Medical Decision Making / ED Course   This patient presents to the ED for concern of syncope, hypotension, this involves an extensive number of treatment options, and is a complaint that carries with it a high risk of complications and morbidity.  The differential diagnosis includes vasovagal, arrhythmia, neurogenic, PE, dehydration, orthostasis.  Low suspicion for arrhythmia given no prior history, EKG without evidence of arrhythmia, telemetry with no appreciation of arrhythmia.  Low suspicion for PE as she is not complaining of chest pain  MDM: 61 year old female presents today for evaluation of syncopal episode.  According to patient and daughter this has happened in the past.  She has history of vasovagal syncope.  There is also concern for renal cell carcinoma that she follows up with urology for.  Recently had a procedure done.  Upon arrival patient is very somnolent.  Confused oriented to self.  Easily arousable.  Does answer most questions.  Not oriented to year, location.  BP  improved upon arrival however later dropped.  Multiple fluid bags given with pressure bags.  Pressures improved to upper 90s, low 100s.   CMP with creatinine 1.87 when checking  in care everywhere she recently had renal function panel on 05/25/2022 with creatinine of 2.03.   BUN is elevated to 40.  Could indicate hydration.  Doubt AKI given the recent labs from 11/2.  BNP within normal limits.  Given the diarrhea episodes in the department.  C. difficile and GI stool panel was ordered.  C. difficile screen is negative.  Troponin is negative x2.  Other work-up as above.  Will give additional fluid bolus.  Will discuss with unassigned internal medicine for admission. Discussed with internal medicine team.  They will evaluate patient for admission.  Lab Tests: -I ordered, reviewed, and interpreted labs.   The pertinent results include:   Labs Reviewed  CBC WITH DIFFERENTIAL/PLATELET - Abnormal; Notable for the following components:      Result Value   WBC 11.0 (*)    Abs Immature Granulocytes 0.16 (*)    All other components within normal limits  COMPREHENSIVE METABOLIC PANEL - Abnormal; Notable for the following components:   Glucose, Bld 212 (*)    BUN 40 (*)    Creatinine, Ser 1.87 (*)    Albumin 3.4 (*)    Alkaline Phosphatase 132 (*)    GFR, Estimated 31 (*)    All other components within normal limits  C DIFFICILE QUICK SCREEN W PCR REFLEX    GASTROINTESTINAL PANEL BY PCR, STOOL (REPLACES STOOL CULTURE)  BRAIN NATRIURETIC PEPTIDE  MAGNESIUM  TROPONIN I (HIGH SENSITIVITY)  TROPONIN I (HIGH SENSITIVITY)      EKG  EKG Interpretation  Date/Time:  Tuesday June 27 2022 23:02:16 EST Ventricular Rate:  85 PR Interval:  153 QRS Duration: 96 QT Interval:  397 QTC Calculation: 473 R Axis:   -19 Text Interpretation: Sinus rhythm Borderline left axis deviation Low voltage, precordial leads Consider anterior infarct When compared with ECG of 04/03/2022, No significant change was found  Confirmed by Delora Fuel ([id number]) on 06/27/2022 11:05:32 PM         Imaging Studies ordered: I ordered imaging studies including CT abdomen pelvis with contrast, CT head without contrast I independently visualized and interpreted imaging. I agree with the radiologist interpretation   Medicines ordered and prescription drug management: Meds ordered this encounter  Medications   lactated ringers bolus 1,000 mL   lactated ringers bolus 1,000 mL   iohexol (OMNIPAQUE) 350 MG/ML injection 75 mL   lactated ringers bolus 1,000 mL   sodium chloride flush (NS) 0.9 % injection 3 mL   enoxaparin (LOVENOX) injection 40 mg    .love   insulin aspart (novoLOG) injection 0-20 Units    Order Specific Question:   Correction coverage:    Answer:   Resistant (obese, steroids)    Order Specific Question:   CBG < 70:    Answer:   Implement Hypoglycemia Standing Orders and refer to Hypoglycemia Standing Orders sidebar report    Order Specific Question:   CBG 70 - 120:    Answer:   0 units    Order Specific Question:   CBG 121 - 150:    Answer:   3 units    Order Specific Question:   CBG 151 - 200:    Answer:   4 units    Order Specific Question:   CBG 201 - 250:    Answer:   7 units    Order Specific Question:   CBG 251 - 300:    Answer:   11 units    Order Specific Question:   CBG 301 -  350:    Answer:   15 units    Order Specific Question:   CBG 351 - 400:    Answer:   20 units    Order Specific Question:   CBG > 400    Answer:   call MD and obtain STAT lab verification    -I have reviewed the patients home medicines and have made adjustments as needed  Critical interventions Volume resuscitation   Cardiac Monitoring: The patient was maintained on a cardiac monitor.  I personally viewed and interpreted the cardiac monitored which showed an underlying rhythm of: Normal sinus rhythm   Reevaluation: After the interventions noted above, I reevaluated the patient and found that they have  :improved  Co morbidities that complicate the patient evaluation  Past Medical History:  Diagnosis Date   Chest pain    a. 06/2017: cath showing normal cors with a preserved EF of 55-65%.    Depression    Diabetes mellitus    Hypertension    Neuropathy    Pneumonia    Renal disorder    Sepsis ([place])       Dispostion: Discussed with internal medicine team will evaluate patient for admission.  Final Clinical Impression(s) / ED Diagnoses Final diagnoses:  Syncope and collapse  Dehydration    Rx / DC Orders ED Discharge Orders     None         Evlyn Courier, PA-C 34/94/94 [id number]    Delora Fuel, MD 58/44/17 0730

## 2022-06-29 ENCOUNTER — Observation Stay (HOSPITAL_COMMUNITY): Payer: Medicare HMO

## 2022-06-29 DIAGNOSIS — Z8249 Family history of ischemic heart disease and other diseases of the circulatory system: Secondary | ICD-10-CM | POA: Diagnosis not present

## 2022-06-29 DIAGNOSIS — K559 Vascular disorder of intestine, unspecified: Secondary | ICD-10-CM | POA: Diagnosis present

## 2022-06-29 DIAGNOSIS — K5730 Diverticulosis of large intestine without perforation or abscess without bleeding: Secondary | ICD-10-CM | POA: Diagnosis present

## 2022-06-29 DIAGNOSIS — Z8673 Personal history of transient ischemic attack (TIA), and cerebral infarction without residual deficits: Secondary | ICD-10-CM | POA: Diagnosis not present

## 2022-06-29 DIAGNOSIS — Z6841 Body Mass Index (BMI) 40.0 and over, adult: Secondary | ICD-10-CM | POA: Diagnosis not present

## 2022-06-29 DIAGNOSIS — N39490 Overflow incontinence: Secondary | ICD-10-CM | POA: Diagnosis present

## 2022-06-29 DIAGNOSIS — E1143 Type 2 diabetes mellitus with diabetic autonomic (poly)neuropathy: Secondary | ICD-10-CM | POA: Diagnosis present

## 2022-06-29 DIAGNOSIS — I951 Orthostatic hypotension: Secondary | ICD-10-CM | POA: Diagnosis present

## 2022-06-29 DIAGNOSIS — E860 Dehydration: Secondary | ICD-10-CM | POA: Diagnosis present

## 2022-06-29 DIAGNOSIS — R1032 Left lower quadrant pain: Secondary | ICD-10-CM | POA: Diagnosis not present

## 2022-06-29 DIAGNOSIS — Z811 Family history of alcohol abuse and dependence: Secondary | ICD-10-CM | POA: Diagnosis not present

## 2022-06-29 DIAGNOSIS — E1122 Type 2 diabetes mellitus with diabetic chronic kidney disease: Secondary | ICD-10-CM | POA: Diagnosis present

## 2022-06-29 DIAGNOSIS — A09 Infectious gastroenteritis and colitis, unspecified: Secondary | ICD-10-CM | POA: Diagnosis not present

## 2022-06-29 DIAGNOSIS — R1031 Right lower quadrant pain: Secondary | ICD-10-CM

## 2022-06-29 DIAGNOSIS — K921 Melena: Secondary | ICD-10-CM

## 2022-06-29 DIAGNOSIS — E861 Hypovolemia: Secondary | ICD-10-CM | POA: Diagnosis not present

## 2022-06-29 DIAGNOSIS — T501X5A Adverse effect of loop [high-ceiling] diuretics, initial encounter: Secondary | ICD-10-CM | POA: Diagnosis present

## 2022-06-29 DIAGNOSIS — C642 Malignant neoplasm of left kidney, except renal pelvis: Secondary | ICD-10-CM | POA: Diagnosis present

## 2022-06-29 DIAGNOSIS — Z86010 Personal history of colonic polyps: Secondary | ICD-10-CM | POA: Diagnosis not present

## 2022-06-29 DIAGNOSIS — I129 Hypertensive chronic kidney disease with stage 1 through stage 4 chronic kidney disease, or unspecified chronic kidney disease: Secondary | ICD-10-CM | POA: Diagnosis present

## 2022-06-29 DIAGNOSIS — R339 Retention of urine, unspecified: Secondary | ICD-10-CM | POA: Diagnosis not present

## 2022-06-29 DIAGNOSIS — Z886 Allergy status to analgesic agent status: Secondary | ICD-10-CM | POA: Diagnosis not present

## 2022-06-29 DIAGNOSIS — K5903 Drug induced constipation: Secondary | ICD-10-CM | POA: Diagnosis present

## 2022-06-29 DIAGNOSIS — E1165 Type 2 diabetes mellitus with hyperglycemia: Secondary | ICD-10-CM | POA: Diagnosis not present

## 2022-06-29 DIAGNOSIS — N1831 Chronic kidney disease, stage 3a: Secondary | ICD-10-CM | POA: Diagnosis present

## 2022-06-29 DIAGNOSIS — R55 Syncope and collapse: Secondary | ICD-10-CM | POA: Diagnosis present

## 2022-06-29 DIAGNOSIS — Z833 Family history of diabetes mellitus: Secondary | ICD-10-CM | POA: Diagnosis not present

## 2022-06-29 LAB — BASIC METABOLIC PANEL
Anion gap: 7 (ref 5–15)
BUN: 31 mg/dL — ABNORMAL HIGH (ref 6–20)
CO2: 23 mmol/L (ref 22–32)
Calcium: 7.7 mg/dL — ABNORMAL LOW (ref 8.9–10.3)
Chloride: 109 mmol/L (ref 98–111)
Creatinine, Ser: 1.7 mg/dL — ABNORMAL HIGH (ref 0.44–1.00)
GFR, Estimated: 34 mL/min — ABNORMAL LOW (ref >60)
Glucose, Bld: 133 mg/dL — ABNORMAL HIGH (ref 70–99)
Potassium: 4.1 mmol/L (ref 3.5–5.1)
Sodium: 139 mmol/L (ref 135–145)

## 2022-06-29 LAB — GLUCOSE, CAPILLARY
Glucose-Capillary: 154 mg/dL — ABNORMAL HIGH (ref 70–99)
Glucose-Capillary: 109 mg/dL — ABNORMAL HIGH (ref 70–99)
Glucose-Capillary: 128 mg/dL — ABNORMAL HIGH (ref 70–99)
Glucose-Capillary: 105 mg/dL — ABNORMAL HIGH (ref 70–99)

## 2022-06-29 LAB — CBG MONITORING, ED
Glucose-Capillary: 153 mg/dL — ABNORMAL HIGH (ref 70–99)
Glucose-Capillary: 152 mg/dL — ABNORMAL HIGH (ref 70–99)

## 2022-06-29 LAB — CBC
HCT: 32.1 % — ABNORMAL LOW (ref 36.0–46.0)
Hemoglobin: 10 g/dL — ABNORMAL LOW (ref 12.0–15.0)
MCH: 30.1 pg (ref 26.0–34.0)
MCHC: 31.2 g/dL (ref 30.0–36.0)
MCV: 96.7 fL (ref 80.0–100.0)
Platelets: 162 10*3/uL (ref 150–400)
RBC: 3.32 MIL/uL — ABNORMAL LOW (ref 3.87–5.11)
RDW: 15.4 % (ref 11.5–15.5)
WBC: 9.4 10*3/uL (ref 4.0–10.5)
nRBC: 0 % (ref 0.0–0.2)

## 2022-06-29 LAB — HIV ANTIBODY (ROUTINE TESTING W REFLEX): HIV Screen 4th Generation wRfx: NONREACTIVE

## 2022-06-29 MED ORDER — IOHEXOL 350 MG/ML SOLN
60.0000 mL | Freq: Once | INTRAVENOUS | Status: AC | PRN
  Administered 2022-06-29: 60 mL via INTRAVENOUS

## 2022-06-29 MED ORDER — PEG-KCL-NACL-NASULF-NA ASC-C 100 G PO SOLR
0.5000 | Freq: Once | ORAL | Status: DC
  Filled 2022-06-29: qty 1

## 2022-06-29 MED ORDER — ACETAMINOPHEN 500 MG PO TABS
1000.0000 mg | ORAL_TABLET | Freq: Four times a day (QID) | ORAL | Status: DC | PRN
  Administered 2022-06-29 – 2022-07-01 (×7): 1000 mg via ORAL
  Filled 2022-06-29 (×7): qty 2

## 2022-06-29 MED ORDER — PEG-KCL-NACL-NASULF-NA ASC-C 100 G PO SOLR: 1.0000 | Freq: Once | ORAL | Status: DC

## 2022-06-29 MED ORDER — DULOXETINE HCL 60 MG PO CPEP
60.0000 mg | ORAL_CAPSULE | Freq: Every day | ORAL | Status: DC
  Administered 2022-06-29 – 2022-07-01 (×3): 60 mg via ORAL
  Filled 2022-06-29 (×3): qty 1

## 2022-06-29 MED ORDER — LACTATED RINGERS IV BOLUS
1000.0000 mL | Freq: Once | INTRAVENOUS | Status: AC
  Administered 2022-06-29: 1000 mL via INTRAVENOUS

## 2022-06-29 MED ORDER — GABAPENTIN 300 MG PO CAPS
300.0000 mg | ORAL_CAPSULE | Freq: Three times a day (TID) | ORAL | Status: DC
  Administered 2022-06-29 – 2022-07-01 (×7): 300 mg via ORAL
  Filled 2022-06-29 (×7): qty 1

## 2022-06-29 NOTE — Progress Notes (Signed)
OT note noted BP supine 84/46, BP sitting 90/48, BP standing 85/44.  Patient seen and evaluated at side.  She reports that despite these hypotensive blood pressure recordings, she was asymptomatic throughout her time with OT.  She denies any presyncopal symptoms, no rapid heart rate, no chest pain or headache during this time.  She reports that she was able to work with OT without difficulty.  She denies any diarrhea since this morning.  Diarrhea was mostly watery, significantly less blood compared to previous episodes.  Does state that she feels thirsty.  Patient resting comfortably in bed, no acute distress, alert and oriented. Heart regular rate and rhythm no murmur rub or gallop. Breathing comfortably on room air.  Abdomen soft nontender.  Extremities warm well-perfused.  Suspect low blood pressures do not accurately reflect true blood pressure given lack of symptoms, lack of further GI losses, and previously recorded normotensive blood pressures earlier this month.  We will give IV fluids since patient does still seem to be slightly dehydrated.  Will also recheck with manual blood pressure cuff to ensure accuracy of BP readings.

## 2022-06-29 NOTE — ED Notes (Signed)
Full linen change provided due to bloody, loose bowel movement

## 2022-06-29 NOTE — Progress Notes (Signed)
Physical Therapy Evaluation Patient Details Name: [name] MRN: [id number] DOB: May 26, 1961 Today's Date: 06/29/2022  History of Present Illness  Pt is a 61 y/o female admitted after syncopal event, likely vasovagal. Pt also noted with bloody stools with GI to follow outpatient. PMH: HTN, DM2, MDD, CKD 3  Clinical Impression  Pt was seen for mobility after being in bed with another moment of GI incontinence.  Pt was not aware of the issue, but is able with help to get to the side of the bed and use BSC for follow up with the incontinence.  Pt is close to baseline with her transfers and bed mob, and will anticipate use of HHPT should get her quickly back to baseline with her general standing control and safety to get to wheelchair.  Follow for goals of PT as are outlined below.       Recommendations for follow up therapy are one component of a multi-disciplinary discharge planning process, led by the attending physician.  Recommendations may be updated based on patient status, additional functional criteria and insurance authorization.  Follow Up Recommendations Home health PT      Assistance Recommended at Discharge Frequent or constant Supervision/Assistance  Patient can return home with the following  A little help with walking and/or transfers;A little help with bathing/dressing/bathroom;Assistance with cooking/housework;Assist for transportation    Equipment Recommendations None recommended by PT  Recommendations for Other Services       Functional Status Assessment Patient has had a recent decline in their functional status and demonstrates the ability to make significant improvements in function in a reasonable and predictable amount of time.     Precautions / Restrictions Precautions Precautions: Fall;Other (comment) Precaution Comments: monitor BP Restrictions Weight Bearing Restrictions: No      Mobility  Bed Mobility Overal bed mobility: Modified Independent              General bed mobility comments: extra time for slow progression to side of bed    Transfers Overall transfer level: Needs assistance Equipment used: Rolling walker (2 wheels) Transfers: Sit to/from Stand, Bed to chair/wheelchair/BSC Sit to Stand: Min guard   Step pivot transfers: Min assist       General transfer comment: supervised to stand and then one hand assist to ensure safe transition    Ambulation/Gait               General Gait Details: transfers only due to weakness  Stairs            Wheelchair Mobility    Modified Rankin (Stroke Patients Only)       Balance Overall balance assessment: Needs assistance Sitting-balance support: Feet supported Sitting balance-Leahy Scale: Fair     Standing balance support: Bilateral upper extremity supported, During functional activity Standing balance-Leahy Scale: Poor                               Pertinent Vitals/Pain Pain Assessment Pain Assessment: Faces Faces Pain Scale: Hurts little more Pain Location: perineum    Home Living Family/patient expects to be discharged to:: Private residence Living Arrangements: Children Available Help at Discharge: Family;Available 24 hours/day Type of Home: House Home Access: Stairs to enter Entrance Stairs-Rails: Psychiatric nurse of Steps: several   Home Layout: One level Home Equipment: Conservation officer, nature (2 wheels);Wheelchair - manual;BSC/3in1 Additional Comments: reports her daughter is a Marine scientist, works from home and can assist. family assisting in  remodeling an accessible mobile home for pt to move to in the future.    Prior Function Prior Level of Function : Needs assist       Physical Assist : Mobility (physical) Mobility (physical): Transfers   Mobility Comments: wc walker, usually transfers alone ADLs Comments: typically Modified Independent with ADLs. Family assist with IADLs though pt able to complete basic  kitchen tasks from w/c level, driving.     Hand Dominance   Dominant Hand: Right    Extremity/Trunk Assessment   Upper Extremity Assessment Upper Extremity Assessment: Defer to OT evaluation    Lower Extremity Assessment Lower Extremity Assessment: Generalized weakness    Cervical / Trunk Assessment Cervical / Trunk Assessment: Normal  Communication   Communication: No difficulties  Cognition Arousal/Alertness: Awake/alert Behavior During Therapy: WFL for tasks assessed/performed Overall Cognitive Status: Within Functional Limits for tasks assessed                                          General Comments General comments (skin integrity, edema, etc.): pt is up to side of bed with wet linen noted on bed with reddish tinge to it.  Shared with GI docs upon entry to room    Exercises     Assessment/Plan    PT Assessment Patient needs continued PT services  PT Problem List Decreased strength;Decreased activity tolerance;Decreased balance;Decreased mobility;Decreased coordination;Decreased knowledge of use of DME;Cardiopulmonary status limiting activity;Decreased skin integrity;Pain       PT Treatment Interventions DME instruction;Gait training;Functional mobility training;Therapeutic activities;Therapeutic exercise;Balance training;Neuromuscular re-education;Patient/family education    PT Goals (Current goals can be found in the Care Plan section)  Acute Rehab PT Goals Patient Stated Goal: to get GI symptoms better PT Goal Formulation: With patient Time For Goal Achievement: 07/13/22 Potential to Achieve Goals: Good    Frequency Min 3X/week     Co-evaluation               AM-PAC PT "6 Clicks" Mobility  Outcome Measure Help needed turning from your back to your side while in a flat bed without using bedrails?: A Little Help needed moving from lying on your back to sitting on the side of a flat bed without using bedrails?: A Little Help  needed moving to and from a bed to a chair (including a wheelchair)?: A Little Help needed standing up from a chair using your arms (e.g., wheelchair or bedside chair)?: A Little Help needed to walk in hospital room?: Total Help needed climbing 3-5 steps with a railing? : Total 6 Click Score: 14    End of Session Equipment Utilized During Treatment: Gait belt Activity Tolerance: Patient tolerated treatment well;Patient limited by fatigue Patient left: in bed;with call bell/phone within reach;with bed alarm set Nurse Communication: Mobility status PT Visit Diagnosis: Unsteadiness on feet (R26.81);Muscle weakness (generalized) (M62.81);Difficulty in walking, not elsewhere classified (R26.2);Pain Pain - Right/Left:  (perineum) Pain - part of body:  (perineum)    Time: 2671-2458 PT Time Calculation (min) (ACUTE ONLY): 31 min   Charges:   PT Evaluation $PT Eval Moderate Complexity: 1 Mod PT Treatments $Therapeutic Activity: 8-22 mins       Ramond Dial 06/29/2022, 4:23 PM  Mee Hives, PT PhD Acute Rehab Dept. Number: [facility]

## 2022-06-29 NOTE — Care Management Obs Status (Signed)
[facility] NOTIFICATION   Patient Details  Name: [name] MRN: [id number] Date of Birth: 18-Oct-1960   Medicare Observation Status Notification Given:  Yes    Bethena Roys, RN 06/29/2022, 9:59 AM

## 2022-06-29 NOTE — Progress Notes (Addendum)
[facility] Progress Note  CC:  GI bleed   Subjective:  Feeling better.  Still with some lower abdominal discomfort.  Stools with much less blood.  Nurse said brown stool this AM.  Objective:  Vital signs in last 24 hours: Temp:  [98.8 F (37.1 C)-99.8 F (37.7 C)] 99.8 F (37.7 C) (12/07 0850) Pulse Rate:  [48-118] 92 (12/07 0850) Resp:  [14-33] 17 (12/07 0800) BP: (83-146)/(34-77) 115/52 (12/07 0850) SpO2:  [88 %-100 %] 100 % (12/07 0850) Weight:  [110.4 kg] 110.4 kg (12/07 0850) Last BM Date : 06/29/22 General:  Alert, Well-developed, in NAD Heart:  Regular rate and rhythm; no murmurs Pulm  CTAB.  No W/R/R. Abdomen:  Soft, non-distended.  BS present.  Mild lower abdominal TTP. Extremities:  Without edema. Neurologic:  Alert and oriented x 4;  grossly normal neurologically. Psych:  Alert and cooperative. Normal mood and affect.  Intake/Output from previous day: 12/06 0701 - 12/07 0700 In: 61 [IV Piggyback:50] Out: 2000 [Urine:2000]  Lab Results: Recent Labs    06/27/22 2249 06/28/22 1526 06/28/22 2126 06/29/22 0230  WBC 11.0* 9.4  --  9.4  HGB 12.6 11.4* 9.8* 10.0*  HCT 38.3 35.1* 31.1* 32.1*  PLT 216 189  --  162   BMET Recent Labs    06/27/22 2249 06/29/22 0230  NA 138 139  K 3.9 4.1  CL 102 109  CO2 24 23  GLUCOSE 212* 133*  BUN 40* 31*  CREATININE 1.87* 1.70*  CALCIUM 9.1 7.7*   LFT Recent Labs    06/27/22 2249  PROT 7.0  ALBUMIN 3.4*  AST 29  ALT 19  ALKPHOS 132*  BILITOT 0.9   PT/INR Recent Labs    06/28/22 1711  LABPROT 14.4  INR 1.1   CT ANGIO GI BLEED  Result Date: 06/29/2022 CLINICAL DATA:  Lower GI bleed. EXAM: CTA ABDOMEN AND PELVIS WITHOUT AND WITH CONTRAST TECHNIQUE: Multidetector CT imaging of the abdomen and pelvis was performed using the standard protocol during bolus administration of intravenous contrast. Multiplanar reconstructed images and MIPs were obtained and reviewed to evaluate the vascular  anatomy. RADIATION DOSE REDUCTION: This exam was performed according to the departmental dose-optimization program which includes automated exposure control, adjustment of the mA and/or kV according to patient size and/or use of iterative reconstruction technique. CONTRAST:  67m OMNIPAQUE IOHEXOL 350 MG/ML SOLN COMPARISON:  Abdominopelvic CT 06/28/2022 and 02/06/2022. FINDINGS: Lower chest: Mildly increased atelectasis at the right lung base. No significant pleural or pericardial effusion. Aortic and coronary artery atherosclerosis noted. Hepatobiliary: The liver is normal in density without suspicious focal abnormality. No significant biliary dilatation status post cholecystectomy. Pancreas: Unremarkable. No pancreatic ductal dilatation or surrounding inflammatory changes. Spleen: Normal in size without focal abnormality. Adrenals/Urinary Tract: Unchanged left larger than right fatty adrenal gland lesions consistent with incidental myelolipomas. Again demonstrated is a complex lesion in the lower pole of the left kidney which measures 4.2 x 3.8 cm, similar in size to the most recent studies. This is slightly heterogeneous, but demonstrates only minimal enhancement following contrast. The right kidney appears unremarkable. No evidence of urinary tract calculus or hydronephrosis. The bladder is decompressed by a Foley catheter. There may be mild bladder wall thickening. Stomach/Bowel: Per GI bleed protocol, no enteric contrast was administered, although there is a small amount of residual contrast material within the transverse colon lumen. No active GI bleeding identified. There is diffuse colonic wall thickening extending from the mid transverse colon through  the sigmoid colon, suspicious for colitis. The stomach and small bowel appear unremarkable. There are retrocecal surgical clips consistent with previous appendectomy. Vascular/Lymphatic: There are no enlarged abdominal or pelvic lymph nodes. Scattered small  retroperitoneal lymph nodes are unchanged. There is mild aortic and branch vessel atherosclerosis without evidence of aneurysm or large vessel occlusion. As above, no evidence of active GI bleeding. Reproductive: Hysterectomy.  No adnexal mass. Other: No free air, ascites or focal extraluminal fluid collection. Minimal pericolonic soft tissue stranding without focal fluid collection. Musculoskeletal: No acute or significant osseous findings. Multilevel spondylosis. IMPRESSION: 1. No evidence of active GI bleeding. 2. Diffuse colonic wall thickening extending from the mid transverse colon through the sigmoid colon, suspicious for colitis. Based on distribution, this could be ischemic, although there is no evidence of large vessel occlusion. Differential includes infectious and non infectious inflammation. No evidence of bowel obstruction, perforation or abscess. 3. Stable complex cystic lesion in the lower pole of the left kidney, not previously fully characterized and indeterminate in etiology. Unless this has been more fully characterized elsewhere, recommend further evaluation with dedicated renal MRI without and with contrast. 4. Stable bilateral adrenal myelolipomas. 5.  Aortic Atherosclerosis (ICD10-I70.0). Electronically Signed   By: Richardean Sale M.D.   On: 06/29/2022 08:22   CT ABDOMEN PELVIS W CONTRAST  Result Date: 06/28/2022 CLINICAL DATA:  Syncope.  Abdominal pain, acute, nonlocalized EXAM: CT ABDOMEN AND PELVIS WITH CONTRAST TECHNIQUE: Multidetector CT imaging of the abdomen and pelvis was performed using the standard protocol following bolus administration of intravenous contrast. RADIATION DOSE REDUCTION: This exam was performed according to the departmental dose-optimization program which includes automated exposure control, adjustment of the mA and/or kV according to patient size and/or use of iterative reconstruction technique. CONTRAST:  5m OMNIPAQUE IOHEXOL 350 MG/ML SOLN COMPARISON:   02/06/2022 FINDINGS: Lower chest: No acute abnormality. Hepatobiliary: No focal liver abnormality is seen. Status post cholecystectomy. No biliary dilatation. Pancreas: No focal abnormality or ductal dilatation. Spleen: No focal abnormality.  Normal size. 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 Partially enhancing mass seen within the lower pole of the left kidney measuring 4.5 cm compared to 2.8 cm previously concerning for renal cell carcinoma. No stones or hydronephrosis. Urinary bladder unremarkable. Stomach/Bowel: Stomach, large and small bowel grossly unremarkable. Vascular/Lymphatic: Aortic atherosclerosis. No evidence of aneurysm or adenopathy. Reproductive: Prior hysterectomy.  No adnexal masses. Other: No free fluid or free air. Musculoskeletal: No acute bony abnormality. IMPRESSION: 4.5 cm enhancing lesion in the lower pole of the left kidney, enlarging from 2.8 cm previously. This is concerning for renal cell carcinoma. Recommend urology consultation and further characterization with renal protocol MRI. Aortic atherosclerosis. Electronically Signed   By: KRolm BaptiseM.D.   On: 06/28/2022 03:01   CT Head Wo Contrast  Result Date: 06/28/2022 CLINICAL DATA:  Mental status change, unknown cause. Syncopal episode. EXAM: CT HEAD WITHOUT CONTRAST TECHNIQUE: Contiguous axial images were obtained from the base of the skull through the vertex without intravenous contrast. RADIATION DOSE REDUCTION: This exam was performed according to the departmental dose-optimization program which includes automated exposure control, adjustment of the mA and/or kV according to patient size and/or use of iterative reconstruction technique. COMPARISON:  05/16/2021. FINDINGS: Brain: No acute intracranial hemorrhage, midline shift or mass effect. No extra-axial fluid collection. Gray-white matter differentiation is within normal limits. No  hydrocephalus. Vascular: No hyperdense vessel or unexpected calcification. Skull: Normal. Negative for fracture or focal lesion. Sinuses/Orbits:  Mild mucosal thickening in the left maxillary sinus. No acute orbital abnormality. Other: None. IMPRESSION: No acute intracranial process. Electronically Signed   By: Brett Fairy M.D.   On: 06/28/2022 03:00    Assessment / Plan: *GI bleed: CTA performed last night shows colitis from the mid transverse to the sigmoid colon.  Stool studies negative for infectious source.  Suspect this could be ischemia possibly related to her left RCC ablation by urology last week.  Hemoglobin 10 g this morning.  Has not required transfusion at this point. *Left RCC, following with urology and had ablation last week. *Personal history of colon polyps 6 years ago on colonoscopy in [place].  Was to have a repeat in 1 year, but did not proceed.  -Continue Zosyn for now. -She will need a colonoscopy, but she obviously needs this for surveillance purposes also so she may be better served for that to be done as an outpatient once his acute colitis issue resolves.  If she fails to improve then we can proceed as an inpatient, however. -Continue with supportive care with IV hydration, antiemetics, pain control, etc. -Continue to monitor labs and transfuse if needed.    LOS: 0 days   [name]. Zehr  06/29/2022, 9:19 AM   Attending physician's note   I have taken a history, reviewed the chart and examined the patient. I performed a substantive portion of this encounter, including complete performance of at least one of the key components, in conjunction with the APP. I agree with the APP's note, impression and recommendations.    CT abdomen pelvis consistent with possible ischemic colitis  She is passing brown stool with some residual blood but no sign of ongoing GI bleed  GI pathogen negative for acute GI infection  Complains of lower abdominal discomfort  Will defer  colonoscopy during this admission as she may not be able to undergo bowel prep and the exam will be suboptimal in the setting of acute colitis  Will arrange for outpatient GI follow-up on discharge  Continue antibiotics for 7-day course Advance diet as tolerated Continue supportive care  The patient was provided an opportunity to ask questions and all were answered. The patient agreed with the plan and demonstrated an understanding of the instructions.   Damaris Hippo , MD [phone]

## 2022-06-29 NOTE — Evaluation (Signed)
Occupational Therapy Evaluation Patient Details Name: [name] MRN: [id number] DOB: [birth year] Today's Date: 06/29/2022   History of Present Illness Pt is a 61 y/o female admitted after syncopal event, likely vasovagal. Pt also noted with bloody stools with GI to follow outpatient. PMH: HTN, DM2, MDD, CKD 3   Clinical Impression   PTA, pt lives with daughter, typically Modified Independent with ADLs and basic household IADLs from a wheelchair level. Pt able to transfer self to/from wheelchair without assist at baseline. Pt presents now fairly close to reported baseline with minor deficits in strength and activity tolerance. Overall, pt requires min guard for transfers with RW and no more than Min A for LB ADLs. Noted BP low though negative for orthostatic hypotension. Anticipate no OT needs at DC with pt in agreement. Will continue to follow acutely to maximize endurance for ADLs/transfers including HEP instruction as appropriate.  BP supine: 84/46 (58) BP sitting EOB: 90/48 (61) BP standing: 85/44 (56)      Recommendations for follow up therapy are one component of a multi-disciplinary discharge planning process, led by the attending physician.  Recommendations may be updated based on patient status, additional functional criteria and insurance authorization.   Follow Up Recommendations  No OT follow up     Assistance Recommended at Discharge Set up Supervision/Assistance  Patient can return home with the following A little help with bathing/dressing/bathroom;Assistance with cooking/housework;Assist for transportation;Help with stairs or ramp for entrance    Functional Status Assessment  Patient has had a recent decline in their functional status and demonstrates the ability to make significant improvements in function in a reasonable and predictable amount of time.  Equipment Recommendations  None recommended by OT    Recommendations for Other Services       Precautions  / Restrictions Precautions Precautions: Fall;Other (comment) Precaution Comments: monitor BP Restrictions Weight Bearing Restrictions: No      Mobility Bed Mobility Overal bed mobility: Modified Independent                  Transfers Overall transfer level: Needs assistance Equipment used: Rolling walker (2 wheels) Transfers: Sit to/from Stand Sit to Stand: Min guard                  Balance Overall balance assessment: Needs assistance Sitting-balance support: No upper extremity supported, Feet supported Sitting balance-Leahy Scale: Good     Standing balance support: Bilateral upper extremity supported, During functional activity Standing balance-Leahy Scale: Poor                             ADL either performed or assessed with clinical judgement   ADL Overall ADL's : Needs assistance/impaired Eating/Feeding: Independent   Grooming: Set up   Upper Body Bathing: Set up   Lower Body Bathing: Minimal assistance;Sit to/from stand   Upper Body Dressing : Set up   Lower Body Dressing: Minimal assistance   Toilet Transfer: Min guard;Stand-pivot;Rolling walker (2 wheels);BSC/3in1   Toileting- Clothing Manipulation and Hygiene: Minimal assistance         General ADL Comments: fairly close to baseline, able to transfer with no more than min guard. some minor weakness and standing endurance deficits though functional for tasks     Vision Baseline Vision/History: 0 No visual deficits Ability to See in Adequate Light: 0 Adequate Patient Visual Report: No change from baseline Vision Assessment?: No apparent visual deficits     Perception  Praxis      Pertinent Vitals/Pain Pain Assessment Pain Assessment: No/denies pain     Hand Dominance Right   Extremity/Trunk Assessment Upper Extremity Assessment Upper Extremity Assessment: Overall WFL for tasks assessed   Lower Extremity Assessment Lower Extremity Assessment: Defer to PT  evaluation   Cervical / Trunk Assessment Cervical / Trunk Assessment: Normal   Communication Communication Communication: No difficulties   Cognition Arousal/Alertness: Awake/alert Behavior During Therapy: WFL for tasks assessed/performed Overall Cognitive Status: Within Functional Limits for tasks assessed                                       General Comments       Exercises     Shoulder Instructions      Home Living Family/patient expects to be discharged to:: Private residence Living Arrangements: Children Available Help at Discharge: Family;Available 24 hours/day Type of Home: House Home Access: Stairs to enter [employer] of Steps: several Entrance Stairs-Rails: Right;Left Home Layout: One level     Bathroom Shower/Tub: Occupational psychologist: Standard Bathroom Accessibility: No   Home Equipment: Conservation officer, nature (2 wheels);Wheelchair - manual;BSC/3in1   Additional Comments: reports her daughter is a Marine scientist, works from home and can assist. family assisting in remodeling an accessible mobile home for pt to move to in the future.      Prior Functioning/Environment Prior Level of Function : Needs assist             Mobility Comments: w/c for mobility, can transfer self without assist ADLs Comments: typically Modified Independent with ADLs. Family assist with IADLs though pt able to complete basic kitchen tasks from w/c level, driving.        OT Problem List: Decreased strength;Decreased activity tolerance;Impaired balance (sitting and/or standing)      OT Treatment/Interventions: Self-care/ADL training;Therapeutic exercise;Energy conservation;DME and/or AE instruction;Therapeutic activities;Patient/family education    OT Goals(Current goals can be found in the care plan section) Acute Rehab OT Goals Patient Stated Goal: resolve bleeding issue, home when ready OT Goal Formulation: With patient Time For Goal  Achievement: 07/13/22 Potential to Achieve Goals: Good  OT Frequency: Min 2X/week    Co-evaluation              AM-PAC OT "6 Clicks" Daily Activity     Outcome Measure Help from another person eating meals?: None Help from another person taking care of personal grooming?: A Little Help from another person toileting, which includes using toliet, bedpan, or urinal?: A Little Help from another person bathing (including washing, rinsing, drying)?: A Little Help from another person to put on and taking off regular upper body clothing?: A Little Help from another person to put on and taking off regular lower body clothing?: A Little 6 Click Score: 19   End of Session Equipment Utilized During Treatment: Rolling walker (2 wheels) Nurse Communication: Mobility status;Other (comment) (IV malfunction)  Activity Tolerance: Patient tolerated treatment well Patient left: in bed;with call bell/phone within reach  OT Visit Diagnosis: Other abnormalities of gait and mobility (R26.89)                Time: 2725-3664 OT Time Calculation (min): 23 min Charges:  OT General Charges $OT Visit: 1 Visit OT Evaluation $OT Eval Low Complexity: 1 Low  Malachy Chamber, OTR/L Acute Rehab Services Office: [phone]   Layla Maw 06/29/2022, 1:24 PM

## 2022-06-29 NOTE — Progress Notes (Signed)
HD#0 SUBJECTIVE:  Patient Summary: [name] is a 61 y.o. with a pertinent PMH of hypertension, diabetes type 2, CKD 3, who presented with syncope and admitted for syncope.   Overnight Events: Patient developed diarrhea with overt hematochezia    Interm History: Endorses lower abdominal pain. Diarrhea improving. Stool more brown today, last episode overnight.   OBJECTIVE:  Vital Signs: Vitals:   06/29/22 0700 06/29/22 0800 06/29/22 0850 06/29/22 1220  BP: (!) 120/51 (!) 119/49 (!) 115/52 (!) 69/47  Pulse: 97 99 92 79  Resp: '18 17  18  '$ Temp:   99.8 F (37.7 C) 99.2 F (37.3 C)  TempSrc:   Oral Oral  SpO2: 95% 98% 100% 98%  Weight:   110.4 kg   Height:   '5\' 4"'$  (1.626 m)    Supplemental O2:  SpO2: 98 % O2 Flow Rate (L/min): 2 L/min  Filed Weights   06/27/22 2256 06/29/22 0850  Weight: 113.4 kg 110.4 kg     Intake/Output Summary (Last 24 hours) at 06/29/2022 1311 Last data filed at 06/28/2022 1831 Gross per 24 hour  Intake 50 ml  Output 2000 ml  Net -1950 ml   Net IO Since Admission: 48 mL [06/29/22 1311]  Physical Exam: Gen: obese, non-toxic, no acute distress CV: RRR, normal s1/s2, no m/r/g Pulm: normal wob, LCTAB at the anterior fields on 2lnc Abd: soft, tender to palpation RLQ/suprapubic/LLQ Extremities: warm, dry, no LE, 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 01/14/22  0127  --  165   Wound / Incision (Open or Dehisced) 01/14/22 Venous stasis ulcer Foot Anterior;Right 01/14/22  0127  Foot  165            Pertinent Labs:    Latest Ref Rng & Units 06/29/2022    2:30 AM 06/28/2022    9:26 PM 06/28/2022    3:26 PM  CBC  WBC 4.0 - 10.5 K/uL 9.4   9.4   Hemoglobin 12.0 - 15.0 g/dL 10.0  9.8  11.4   Hematocrit 36.0 - 46.0 % 32.1  31.1  35.1   Platelets 150 - 400 K/uL 162   189        Latest Ref Rng & Units 06/29/2022    2:30 AM 06/27/2022   10:49 PM 04/05/2022    3:37 AM  CMP  Glucose 70 - 99 mg/dL 133  212  109   BUN 6 - 20 mg/dL 31  40  38   Creatinine 0.44 - 1.00 mg/dL 1.70  1.87  1.31   Sodium 135 - 145 mmol/L 139  138  139   Potassium 3.5 - 5.1 mmol/L 4.1  3.9  3.9   Chloride 98 - 111 mmol/L 109  102  114   CO2 22 - 32 mmol/L '23  24  23   '$ Calcium 8.9 - 10.3 mg/dL 7.7  9.1  7.9   Total Protein 6.5 - 8.1 g/dL  7.0    Total Bilirubin 0.3 - 1.2 mg/dL  0.9    Alkaline Phos 38 - 126 U/L  132    AST 15 - 41 U/L  29    ALT 0 - 44 U/L  19      Recent Labs    06/29/22 0813 06/29/22 0845 06/29/22 1220  GLUCAP 152* 154* 109*     Pertinent Imaging: CT ANGIO GI BLEED  Result Date: 06/29/2022 CLINICAL DATA:  Lower GI bleed. EXAM: CTA ABDOMEN AND PELVIS WITHOUT AND WITH CONTRAST TECHNIQUE: Multidetector CT imaging of the abdomen and pelvis was performed using the standard protocol during bolus administration of intravenous contrast. Multiplanar reconstructed images and MIPs were obtained and reviewed to evaluate the vascular anatomy. RADIATION DOSE REDUCTION: This exam was performed according to the departmental dose-optimization program which includes automated exposure control, adjustment of the mA and/or kV according to patient size and/or use of iterative reconstruction technique. CONTRAST:  2m OMNIPAQUE IOHEXOL 350 MG/ML SOLN COMPARISON:  Abdominopelvic CT 06/28/2022 and 02/06/2022. FINDINGS: Lower chest: Mildly increased atelectasis at the right lung  base. No significant pleural or pericardial effusion. Aortic and coronary artery atherosclerosis noted. Hepatobiliary: The liver is normal in density without suspicious focal abnormality. No significant biliary dilatation status post cholecystectomy. Pancreas: Unremarkable. No pancreatic ductal dilatation or surrounding inflammatory changes. Spleen: Normal in size without focal abnormality. Adrenals/Urinary Tract: Unchanged left larger than right fatty adrenal gland lesions consistent with incidental myelolipomas. Again demonstrated is a complex lesion in the lower pole of the left kidney which measures 4.2 x 3.8 cm, similar in size to the most recent studies. This is slightly heterogeneous, but demonstrates only minimal enhancement following contrast. The right kidney appears unremarkable. No evidence of urinary tract calculus or hydronephrosis. The bladder is decompressed by a Foley catheter. There may be mild bladder wall thickening. Stomach/Bowel: Per GI bleed protocol, no enteric contrast was administered, although there is a small amount of residual contrast material within the transverse colon lumen. No active GI bleeding identified. There is diffuse colonic wall thickening extending from the mid transverse colon through the sigmoid colon, suspicious for colitis. The stomach and small bowel appear unremarkable. There are retrocecal surgical clips consistent with previous appendectomy. Vascular/Lymphatic: There are no enlarged abdominal or pelvic lymph nodes. Scattered small retroperitoneal lymph nodes are unchanged. There is mild aortic and branch vessel atherosclerosis without evidence of aneurysm or large vessel occlusion. As above, no evidence of active GI bleeding. Reproductive: Hysterectomy.  No adnexal mass. Other: No free air, ascites or focal extraluminal fluid collection. Minimal pericolonic soft tissue stranding without focal fluid collection. Musculoskeletal: No acute or significant osseous  findings. Multilevel spondylosis. IMPRESSION: 1. No evidence of active GI bleeding. 2. Diffuse colonic wall thickening extending from the mid transverse colon through the sigmoid colon, suspicious for colitis. Based on distribution, this could be ischemic, although there is no evidence of large vessel occlusion. Differential includes infectious and non infectious inflammation. No evidence of bowel obstruction, perforation or abscess. 3. Stable complex cystic lesion in the lower pole of the left kidney, not previously fully characterized and indeterminate in etiology. Unless this has been more fully characterized elsewhere, recommend further evaluation with dedicated renal MRI without and with contrast. 4. Stable bilateral adrenal myelolipomas. 5.  Aortic Atherosclerosis (ICD10-I70.0). Electronically Signed   By: WRichardean SaleM.D.   On: 06/29/2022 08:22    ASSESSMENT/PLAN:  Assessment: Principal Problem:  Syncope Active Problems:   Renal cell carcinoma of left kidney (HCC)   Hematochezia   Infectious colitis  Syncope Orthostatic hypotension She is s/p 4L IVF this hospitalization after episode of hypotension and lethargy last night with systolics in 53Z.  Blood pressure is since normalized and remained stable with maps in the 70s.  Mental status improved this morning and renal function has remained stable. PT evaluated the patient and recommends HHPT. No OT follow up necessary. - IV fluids as necessary - Continue to monitor - HH PT  Acute on chronic KD 3a Urinary retention - likely autonomic dysfunction Patient has been receiving IV fluids, renal function appears to be at baseline.  She is continuing to have adequate urine output.  Foley catheter is in place for urinary retention. She has carbamazepine as home medication which can cause retention, but this is being held.  - continue hydration - daily BMP   Hematochezia, BRBPR Abdominal pain CT abdomen pelvis most consistent with ischemic  colitis, however patient does not have the typical pain out of proportion we would expect.  Possible explanation for this is autonomic dysfunction.  Her bloody bowel movements have begun to improve.  Most recent episode of watery diarrhea was this morning with significantly less blood.  Patient will need to have colonoscopy once inflammation has resolved which can be done as an outpatient.  GI to schedule outpatient follow-up. - Continue antibiotics for total 7 days. - Advance diet as tolerated - Supportive care - Fluids and blood products as necessary.  HTN Hypotensive, likely due to dehydration.  Holding home medicines.  Continue to monitor.   T2DM Home regimen includes glargine 30u nightly per the patient, SSI with meals, mounjaro.  -SSI   Left RCC Following with [name] IR at  [facility] for this. Cryoablation performed 11/29. Ct this admission shows interval increase in size of the mass from 2.8 cm to 4.5 cm; possible sequelae of the ablation. Continue to follow with IR outpatient.    Dispo: Admit patient to Inpatient with expected length of stay greater than 2 midnights. Fluids: LR bolus Code: full DVT: lovenox  Delene Ruffini, MD

## 2022-06-29 NOTE — Care Management (Signed)
  Transition of Care [facility]) Screening Note   Patient Details  Name: [name] Date of Birth: 28-Jun-1961   Transition of Care [facility]) CM/SW Contact:    Bethena Roys, RN Phone Number: 06/29/2022, 9:39 AM    Transition of Care Department [facility]) has reviewed the patient and no TOC needs have been identified at this time. We will continue to monitor patient advancement through interdisciplinary progression rounds. If new patient transition needs arise, please place a TOC consult.

## 2022-06-29 NOTE — ED Notes (Signed)
Pt to CT

## 2022-06-30 ENCOUNTER — Encounter (HOSPITAL_COMMUNITY)
Admission: EM | Disposition: A | Discharge status: Home / Self Care | Payer: Self-pay | Source: Home / Self Care | Attending: Internal Medicine

## 2022-06-30 DIAGNOSIS — K921 Melena: Secondary | ICD-10-CM | POA: Diagnosis not present

## 2022-06-30 DIAGNOSIS — N1831 Chronic kidney disease, stage 3a: Secondary | ICD-10-CM | POA: Diagnosis not present

## 2022-06-30 DIAGNOSIS — Z7985 Long-term (current) use of injectable non-insulin antidiabetic drugs: Secondary | ICD-10-CM

## 2022-06-30 DIAGNOSIS — Z794 Long term (current) use of insulin: Secondary | ICD-10-CM

## 2022-06-30 DIAGNOSIS — A09 Infectious gastroenteritis and colitis, unspecified: Secondary | ICD-10-CM

## 2022-06-30 DIAGNOSIS — R55 Syncope and collapse: Secondary | ICD-10-CM | POA: Diagnosis not present

## 2022-06-30 DIAGNOSIS — E1165 Type 2 diabetes mellitus with hyperglycemia: Secondary | ICD-10-CM

## 2022-06-30 LAB — CBC
HCT: 29.9 % — ABNORMAL LOW (ref 36.0–46.0)
Hemoglobin: 10.1 g/dL — ABNORMAL LOW (ref 12.0–15.0)
MCH: 31.4 pg (ref 26.0–34.0)
MCHC: 33.8 g/dL (ref 30.0–36.0)
MCV: 92.9 fL (ref 80.0–100.0)
Platelets: 179 10*3/uL (ref 150–400)
RBC: 3.22 MIL/uL — ABNORMAL LOW (ref 3.87–5.11)
RDW: 14.9 % (ref 11.5–15.5)
WBC: 11.4 10*3/uL — ABNORMAL HIGH (ref 4.0–10.5)
nRBC: 0 % (ref 0.0–0.2)

## 2022-06-30 LAB — IRON AND TIBC
Iron: 22 ug/dL — ABNORMAL LOW (ref 28–170)
Saturation Ratios: 11 % (ref 10.4–31.8)
TIBC: 200 ug/dL — ABNORMAL LOW (ref 250–450)
UIBC: 178 ug/dL

## 2022-06-30 LAB — GLUCOSE, CAPILLARY
Glucose-Capillary: 147 mg/dL — ABNORMAL HIGH (ref 70–99)
Glucose-Capillary: 121 mg/dL — ABNORMAL HIGH (ref 70–99)
Glucose-Capillary: 136 mg/dL — ABNORMAL HIGH (ref 70–99)
Glucose-Capillary: 142 mg/dL — ABNORMAL HIGH (ref 70–99)

## 2022-06-30 LAB — BASIC METABOLIC PANEL
Anion gap: 9 (ref 5–15)
BUN: 24 mg/dL — ABNORMAL HIGH (ref 6–20)
CO2: 24 mmol/L (ref 22–32)
Calcium: 7.8 mg/dL — ABNORMAL LOW (ref 8.9–10.3)
Chloride: 107 mmol/L (ref 98–111)
Creatinine, Ser: 1.62 mg/dL — ABNORMAL HIGH (ref 0.44–1.00)
GFR, Estimated: 36 mL/min — ABNORMAL LOW (ref >60)
Glucose, Bld: 114 mg/dL — ABNORMAL HIGH (ref 70–99)
Potassium: 3.7 mmol/L (ref 3.5–5.1)
Sodium: 140 mmol/L (ref 135–145)

## 2022-06-30 SURGERY — COLONOSCOPY WITH PROPOFOL
Anesthesia: Monitor Anesthesia Care

## 2022-06-30 MED ORDER — LACTATED RINGERS IV BOLUS
1000.0000 mL | Freq: Once | INTRAVENOUS | Status: AC
  Administered 2022-06-30: 1000 mL via INTRAVENOUS

## 2022-06-30 MED ORDER — CHLORHEXIDINE GLUCONATE CLOTH 2 % EX PADS
6.0000 | MEDICATED_PAD | Freq: Every day | CUTANEOUS | Status: DC
  Administered 2022-06-30: 6 via TOPICAL

## 2022-06-30 MED ORDER — GERHARDT'S BUTT CREAM
TOPICAL_CREAM | Freq: Three times a day (TID) | CUTANEOUS | Status: DC
  Filled 2022-06-30: qty 1

## 2022-06-30 NOTE — Progress Notes (Signed)
Pharmacy Antibiotic Note  [name] is a 61 y.o. female admitted on 06/27/2022 with hematochezia.  Pharmacy has been consulted for Zosyn dosing for concern of ischemic colitis. Per GI, plans are for 7 days antibiotics  CrCl ~ 45  Plan: Continue Zosyn 3.375g Q8H Will follow renal function and clinical progress    Height: '5\' 4"'$  (162.6 cm) Weight: 110.2 kg (242 lb 15.2 oz) IBW/kg (Calculated) : 54.7  Temp (24hrs), Avg:98.8 F (37.1 C), Min:98.2 F (36.8 C), Max:99.8 F (37.7 C)  Recent Labs  Lab 06/27/22 2249 06/28/22 1526 06/29/22 0230 06/30/22 0237  WBC 11.0* 9.4 9.4 11.4*  CREATININE 1.87*  --  1.70* 1.62*     Estimated Creatinine Clearance: 45.4 mL/min (A) (by C-G formula based on SCr of 1.62 mg/dL (H)).    Allergies  Allergen Reactions   Ibuprofen Swelling   Red Dye     Other reaction(s): Nervous syncope   Codeine Palpitations   Nitrofurantoin Diarrhea and Nausea And Vomiting    Antimicrobials this admission: Zosyn 12/6 >>   Thank you for allowing pharmacy to be a part of this patient's care.  Hildred Laser, PharmD Clinical Pharmacist **Pharmacist phone directory can now be found on [email or web address] ([email or web address]).  Listed under [place].

## 2022-06-30 NOTE — Progress Notes (Signed)
GASTROENTEROLOGY ROUNDING NOTE   Subjective: No further bleeding, hemoglobin has remained stable   Objective: Vital signs in last 24 hours: Temp:  [98.2 F (36.8 C)-98.6 F (37 C)] 98.2 F (36.8 C) (12/08 1300) Pulse Rate:  [76-87] 76 (12/08 1300) Resp:  [16-18] 17 (12/08 1300) BP: (104-139)/(40-61) 139/61 (12/08 1645) SpO2:  [97 %-100 %] 99 % (12/08 1300) Weight:  [110.2 kg] 110.2 kg (12/08 0308) Last BM Date : 06/29/22 General: NAD Abdomen: soft, mild distension, non tender     Intake/Output from previous day: 12/07 0701 - 12/08 0700 In: 1170 [P.O.:120; IV Piggyback:1050] Out: 1250 [Urine:1250] Intake/Output this shift: Total I/O In: 480 [P.O.:480] Out: 450 [Urine:450]   Lab Results: Recent Labs    06/28/22 1526 06/28/22 2126 06/29/22 0230 06/30/22 0237  WBC 9.4  --  9.4 11.4*  HGB 11.4* 9.8* 10.0* 10.1*  PLT 189  --  162 179  MCV 94.1  --  96.7 92.9   BMET Recent Labs    06/27/22 2249 06/29/22 0230 06/30/22 0237  NA 138 139 140  K 3.9 4.1 3.7  CL 102 109 107  CO2 '24 23 24  '$ GLUCOSE 212* 133* 114*  BUN 40* 31* 24*  CREATININE 1.87* 1.70* 1.62*  CALCIUM 9.1 7.7* 7.8*   LFT Recent Labs    06/27/22 2249  PROT 7.0  ALBUMIN 3.4*  AST 29  ALT 19  ALKPHOS 132*  BILITOT 0.9   PT/INR Recent Labs    06/28/22 1711  INR 1.1      Imaging/Other results: CT ANGIO GI BLEED  Result Date: 06/29/2022 CLINICAL DATA:  Lower GI bleed. EXAM: CTA ABDOMEN AND PELVIS WITHOUT AND WITH CONTRAST TECHNIQUE: Multidetector CT imaging of the abdomen and pelvis was performed using the standard protocol during bolus administration of intravenous contrast. Multiplanar reconstructed images and MIPs were obtained and reviewed to evaluate the vascular anatomy. RADIATION DOSE REDUCTION: This exam was performed according to the departmental dose-optimization program which includes automated exposure control, adjustment of the mA and/or kV according to patient size  and/or use of iterative reconstruction technique. CONTRAST:  11m OMNIPAQUE IOHEXOL 350 MG/ML SOLN COMPARISON:  Abdominopelvic CT 06/28/2022 and 02/06/2022. FINDINGS: Lower chest: Mildly increased atelectasis at the right lung base. No significant pleural or pericardial effusion. Aortic and coronary artery atherosclerosis noted. Hepatobiliary: The liver is normal in density without suspicious focal abnormality. No significant biliary dilatation status post cholecystectomy. Pancreas: Unremarkable. No pancreatic ductal dilatation or surrounding inflammatory changes. Spleen: Normal in size without focal abnormality. Adrenals/Urinary Tract: Unchanged left larger than right fatty adrenal gland lesions consistent with incidental myelolipomas. Again demonstrated is a complex lesion in the lower pole of the left kidney which measures 4.2 x 3.8 cm, similar in size to the most recent studies. This is slightly heterogeneous, but demonstrates only minimal enhancement following contrast. The right kidney appears unremarkable. No evidence of urinary tract calculus or hydronephrosis. The bladder is decompressed by a Foley catheter. There may be mild bladder wall thickening. Stomach/Bowel: Per GI bleed protocol, no enteric contrast was administered, although there is a small amount of residual contrast material within the transverse colon lumen. No active GI bleeding identified. There is diffuse colonic wall thickening extending from the mid transverse colon through the sigmoid colon, suspicious for colitis. The stomach and small bowel appear unremarkable. There are retrocecal surgical clips consistent with previous appendectomy. Vascular/Lymphatic: There are no enlarged abdominal or pelvic lymph nodes. Scattered small retroperitoneal lymph nodes are unchanged. There is mild  aortic and branch vessel atherosclerosis without evidence of aneurysm or large vessel occlusion. As above, no evidence of active GI bleeding. Reproductive:  Hysterectomy.  No adnexal mass. Other: No free air, ascites or focal extraluminal fluid collection. Minimal pericolonic soft tissue stranding without focal fluid collection. Musculoskeletal: No acute or significant osseous findings. Multilevel spondylosis. IMPRESSION: 1. No evidence of active GI bleeding. 2. Diffuse colonic wall thickening extending from the mid transverse colon through the sigmoid colon, suspicious for colitis. Based on distribution, this could be ischemic, although there is no evidence of large vessel occlusion. Differential includes infectious and non infectious inflammation. No evidence of bowel obstruction, perforation or abscess. 3. Stable complex cystic lesion in the lower pole of the left kidney, not previously fully characterized and indeterminate in etiology. Unless this has been more fully characterized elsewhere, recommend further evaluation with dedicated renal MRI without and with contrast. 4. Stable bilateral adrenal myelolipomas. 5.  Aortic Atherosclerosis (ICD10-I70.0). Electronically Signed   By: Richardean Sale M.D.   On: 06/29/2022 08:22      Assessment &Plan  61 year old female presented with lower abdominal discomfort and hematochezia  CT abdomen pelvis consistent with possible ischemic colitis   She is passing brown stool with no further rectal bleeding, hemoglobin has remained stable  Will defer colonoscopy during this admission as she may not be able to undergo bowel prep and the exam will be suboptimal in the setting of acute colitis   Will arrange for outpatient GI follow-up on discharge   GI will sign off, available if have any questions   K. Denzil Magnuson , MD [phone]  [facility]

## 2022-06-30 NOTE — Progress Notes (Signed)
Physical Therapy Treatment Patient Details Name: [name] MRN: [id number] DOB: 1961-01-02 Today's Date: 06/30/2022   History of Present Illness Pt is a 61 y/o female admitted after syncopal event, likely vasovagal. Pt also noted with bloody stools with GI to follow outpatient. PMH: HTN, DM2, MDD, CKD 3    PT Comments    Pt sitting up in recliner on entry. Reports she was able to transfer with min A but that due to her B Charcot feet that she has been advised to limit weightbearing and uses w/c at baseline. Agreeable to seated exercises. PT to bring wheelchair for practice in future sessions.     Recommendations for follow up therapy are one component of a multi-disciplinary discharge planning process, led by the attending physician.  Recommendations may be updated based on patient status, additional functional criteria and insurance authorization.  Follow Up Recommendations  Home health PT     Assistance Recommended at Discharge Frequent or constant Supervision/Assistance  Patient can return home with the following A little help with walking and/or transfers;A little help with bathing/dressing/bathroom;Assistance with cooking/housework;Assist for transportation   Equipment Recommendations  None recommended by PT       Precautions / Restrictions Precautions Precautions: Fall;Other (comment) Precaution Comments: monitor BP Restrictions Weight Bearing Restrictions: No Other Position/Activity Restrictions: pt uses w/c at baseline secondary to B Charcot foot, pt is awaiting surgery and has been advised to limit weight bearing to pivot transfers     Mobility  Bed Mobility               General bed mobility comments: OOB in recliner     Ambulation/Gait               General Gait Details: no ambulation due to B Charcot feet       Balance Overall balance assessment: Needs assistance Sitting-balance support: Feet supported Sitting balance-Leahy Scale:  Fair     Standing balance support: Bilateral upper extremity supported, During functional activity Standing balance-Leahy Scale: Poor                              Cognition Arousal/Alertness: Awake/alert Behavior During Therapy: WFL for tasks assessed/performed Overall Cognitive Status: Within Functional Limits for tasks assessed                                          Exercises General Exercises - Lower Extremity Ankle Circles/Pumps: AROM, Both, 10 reps, Seated Long Arc Quad: AROM, 10 reps, Seated Hip ABduction/ADduction: AROM, Both, 10 reps, Seated Hip Flexion/Marching: AROM, Both, 10 reps, Seated Other Exercises Other Exercises: chair push ups x10    General Comments General comments (skin integrity, edema, etc.): VSS on RA      Pertinent Vitals/Pain Pain Assessment Pain Assessment: No/denies pain     PT Goals (current goals can now be found in the care plan section) Acute Rehab PT Goals Patient Stated Goal: to get GI symptoms better PT Goal Formulation: With patient Time For Goal Achievement: 07/13/22 Potential to Achieve Goals: Good Progress towards PT goals: Progressing toward goals    Frequency    Min 3X/week      PT Plan Current plan remains appropriate       AM-PAC PT "6 Clicks" Mobility   Outcome Measure  Help needed turning from your back to your side  while in a flat bed without using bedrails?: A Little Help needed moving from lying on your back to sitting on the side of a flat bed without using bedrails?: A Little Help needed moving to and from a bed to a chair (including a wheelchair)?: A Little Help needed standing up from a chair using your arms (e.g., wheelchair or bedside chair)?: A Little Help needed to walk in hospital room?: Total Help needed climbing 3-5 steps with a railing? : Total 6 Click Score: 14    End of Session Equipment Utilized During Treatment: Gait belt Activity Tolerance: Patient  tolerated treatment well Patient left: in bed;with call bell/phone within reach;with bed alarm set Nurse Communication: Mobility status PT Visit Diagnosis: Unsteadiness on feet (R26.81);Muscle weakness (generalized) (M62.81);Difficulty in walking, not elsewhere classified (R26.2);Pain     Time: 1352-1403 PT Time Calculation (min) (ACUTE ONLY): 11 min  Charges:  $Therapeutic Exercise: 8-22 mins                     Tameah Mihalko B. Migdalia Dk PT, DPT Acute Rehabilitation Services Please use secure chat or  Call Office [phone]    [place] 06/30/2022, 4:03 PM

## 2022-06-30 NOTE — Progress Notes (Signed)
CCMD advised pt's tele order was discontinued on 12/6. Advised ok to turn off tele. Pt has no order and is classified as Med-Surg

## 2022-06-30 NOTE — Care Management (Signed)
3335 06-30-22 Patient has declined home health services at this time. Patient states she gets around in her wheelchair. Case Manager made MD aware. No further home needs identified at this time.

## 2022-06-30 NOTE — Progress Notes (Signed)
HD#1 SUBJECTIVE:  Patient Summary: [name] is a 61 y.o. with a pertinent PMH of hypertension, diabetes type 2, CKD 3, who presented with syncope and admitted for syncope.   Overnight Events: Had recorded hypotension while working with OT last evening.    Interm History: Feels that she is doing well this morning.  Abdominal pain slightly improved.  Did have some watery diarrhea this morning.  Overall feels well she is moving in the right direction.  OBJECTIVE:  Vital Signs: Vitals:   06/30/22 0308 06/30/22 0755 06/30/22 1300 06/30/22 1645  BP: (!) 128/50 (!) 104/40 (!) 123/57 139/61  Pulse: 87 78 76   Resp:  17 17   Temp: 98.5 F (36.9 C) 98.2 F (36.8 C) 98.2 F (36.8 C)   TempSrc: Oral Oral Oral   SpO2: 100% 99% 99%   Weight: 110.2 kg     Height:       Supplemental O2:  SpO2: 99 % O2 Flow Rate (L/min): 2 L/min  Filed Weights   06/27/22 2256 06/29/22 0850 06/30/22 0308  Weight: 113.4 kg 110.4 kg 110.2 kg     Intake/Output Summary (Last 24 hours) at 06/30/2022 1719 Last data filed at 06/30/2022 1300 Gross per 24 hour  Intake 600 ml  Output 1700 ml  Net -1100 ml   Net IO Since Admission: -2 mL [06/30/22 1719]  Physical Exam: Gen: obese, non-toxic, no acute distress CV: RRR, normal s1/s2, no m/r/g Pulm: normal wob, LCTAB at the anterior fields on 2lnc Abd: soft, tender to palpation RLQ/suprapubic/LLQ Extremities: warm, dry, no LE, 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 01/14/22  0127  -- 165   Wound / Incision (Open or Dehisced) 01/14/22 Venous stasis ulcer Foot Anterior;Right 01/14/22  0127  Foot  165            Pertinent Labs:    Latest Ref Rng & Units 06/30/2022    2:37 AM 06/29/2022    2:30 AM 06/28/2022    9:26 PM  CBC  WBC 4.0 - 10.5 K/uL 11.4  9.4    Hemoglobin 12.0 - 15.0 g/dL 10.1  10.0  9.8   Hematocrit 36.0 - 46.0 % 29.9  32.1  31.1   Platelets 150 - 400 K/uL 179  162         Latest Ref Rng & Units 06/30/2022    2:37 AM 06/29/2022    2:30 AM 06/27/2022   10:49 PM  CMP  Glucose 70 - 99 mg/dL 114  133  212   BUN 6 - 20 mg/dL 24  31  40   Creatinine 0.44 - 1.00 mg/dL 1.62  1.70  1.87   Sodium 135 - 145 mmol/L 140  139  138   Potassium 3.5 - 5.1 mmol/L 3.7  4.1  3.9   Chloride 98 - 111 mmol/L 107  109  102   CO2 22 - 32 mmol/L '24  23  24   '$ Calcium 8.9 - 10.3 mg/dL 7.8  7.7  9.1   Total Protein 6.5 - 8.1 g/dL   7.0   Total Bilirubin 0.3 - 1.2 mg/dL   0.9   Alkaline Phos 38 - 126 U/L   132   AST 15 - 41 U/L   29   ALT 0 - 44 U/L   19     Recent Labs    06/30/22 0900 06/30/22 1301 06/30/22 1643  GLUCAP 147* 121* 136*     Pertinent Imaging: No results found.  ASSESSMENT/PLAN:  Assessment: Principal Problem:   Syncope Active Problems:   Stage 3a chronic kidney disease (CKD) (HCC)   Type 2 diabetes mellitus with hyperglycemia (HCC)   Renal cell carcinoma of left kidney (HCC)   Hematochezia   Infectious colitis  Syncope Orthostatic hypotension Patient has had intermittent episodes of recorded hypotension despite aggressive volume resuscitation with 5 L LR.  Hypotension intermittent and short-lived.  Readings possibly due to vasovagal hypotension. During most recent episode patient was asymptomatic.  BP was rechecked with manual cuff and showed normalized blood pressures. Though feel the hypotension better explained by her large  volume watery GI losses, cannot fully exclude adrenal insufficiency at this time. -Check a.m. cortisol - IV fluids as necessary - Continue to monitor - HH PT  Colitis-infectious versus ischemic Favor infectious colitis despite negative GI panel since GI panel is not comprehensive. Since we have initiated treatment for possible infectious causes with Zosyn, her diarrhea has begun to improve, and is no longer having bloody diarrhea. He has continued to have episodes of watery diarrhea which can be explained by resolving infectious colitis.  We have not tested for ova or parasites at this time but this can be considered if diarrhea does not resolve. CT abdomen pelvis consistent with possible ischemic colitis, however patient does not have the typical pain out of proportion we would expect. Unlikely to have been embolus to this region as we have not observed any A-fib, furthermore vasculature was noted to be clear. If hypotension contributed to colitis, would expect to see inflammation in watershed areas rather than inferior mesenteric artery distribution. - Continue antibiotics for total 2/7 days. - Advance diet as tolerated - Supportive care - Fluids and blood products as necessary.  Encourage aggressive p.o. hydration  Acute on chronic KD 3a Urinary retention - likely autonomic dysfunction Patient has been receiving IV fluids, renal function appears to be at baseline.  She has carbamazepine as home medication which can cause retention, but this is being held.  Foley catheter was removed, voiding trial today -Replace Foley catheter if patient unable to void - continue hydration - daily BMP  HTN Hypotensive, likely due to dehydration.  Holding home medicines.  Continue to monitor.   T2DM Home regimen includes glargine 30u nightly per the patient, SSI with meals, mounjaro.  -SSI   Left RCC Following with [name] IR at  [facility] for this. Cryoablation performed 11/29. Ct this  admission shows interval increase in size of the mass from 2.8 cm to 4.5 cm; possible sequelae of the ablation. Continue to follow with IR outpatient.    Dispo: Admit patient to Inpatient with expected length of stay greater than 2 midnights. Fluids: LR bolus Code: full DVT: scds  Delene Ruffini, MD

## 2022-07-01 DIAGNOSIS — N1831 Chronic kidney disease, stage 3a: Secondary | ICD-10-CM | POA: Diagnosis not present

## 2022-07-01 DIAGNOSIS — A09 Infectious gastroenteritis and colitis, unspecified: Secondary | ICD-10-CM | POA: Diagnosis not present

## 2022-07-01 DIAGNOSIS — R55 Syncope and collapse: Secondary | ICD-10-CM | POA: Diagnosis not present

## 2022-07-01 DIAGNOSIS — E1165 Type 2 diabetes mellitus with hyperglycemia: Secondary | ICD-10-CM | POA: Diagnosis not present

## 2022-07-01 LAB — BASIC METABOLIC PANEL
Anion gap: 8 (ref 5–15)
BUN: 19 mg/dL (ref 6–20)
CO2: 22 mmol/L (ref 22–32)
Calcium: 8.1 mg/dL — ABNORMAL LOW (ref 8.9–10.3)
Chloride: 109 mmol/L (ref 98–111)
Creatinine, Ser: 1.54 mg/dL — ABNORMAL HIGH (ref 0.44–1.00)
GFR, Estimated: 39 mL/min — ABNORMAL LOW (ref >60)
Glucose, Bld: 135 mg/dL — ABNORMAL HIGH (ref 70–99)
Potassium: 3.6 mmol/L (ref 3.5–5.1)
Sodium: 139 mmol/L (ref 135–145)

## 2022-07-01 LAB — CBC
HCT: 31.2 % — ABNORMAL LOW (ref 36.0–46.0)
Hemoglobin: 10.4 g/dL — ABNORMAL LOW (ref 12.0–15.0)
MCH: 30.5 pg (ref 26.0–34.0)
MCHC: 33.3 g/dL (ref 30.0–36.0)
MCV: 91.5 fL (ref 80.0–100.0)
Platelets: 216 10*3/uL (ref 150–400)
RBC: 3.41 MIL/uL — ABNORMAL LOW (ref 3.87–5.11)
RDW: 14.5 % (ref 11.5–15.5)
WBC: 10 10*3/uL (ref 4.0–10.5)
nRBC: 0 % (ref 0.0–0.2)

## 2022-07-01 LAB — CORTISOL: Cortisol, Plasma: 20.1 ug/dL

## 2022-07-01 LAB — GLUCOSE, CAPILLARY
Glucose-Capillary: 118 mg/dL — ABNORMAL HIGH (ref 70–99)
Glucose-Capillary: 155 mg/dL — ABNORMAL HIGH (ref 70–99)

## 2022-07-01 MED ORDER — AMOXICILLIN-POT CLAVULANATE 875-125 MG PO TABS: 1.0000 | ORAL_TABLET | Freq: Two times a day (BID) | ORAL | 0 refills | Status: AC

## 2022-07-01 NOTE — Discharge Summary (Signed)
Name: [name] MRN: [id number] DOB: 07-25-1960 61 y.o. PCP: Teressa Senter, FNP  Date of Admission: 06/27/2022 10:45 PM Date of Discharge: 07/01/22 Attending Physician: Dr. Angelia Mould  Discharge Diagnosis: Principal Problem:   Syncope Active Problems:   Morbid obesity ([place])   Stage 3a chronic kidney disease (CKD) (HCC)   Type 2 diabetes mellitus with hyperglycemia ([place])   Renal cell carcinoma of left kidney (HCC)   Hematochezia   Infectious colitis    Discharge Medications: Allergies as of 07/01/2022       Reactions   Ibuprofen Swelling   Red Dye    Other reaction(s): Nervous syncope   Codeine Palpitations   Nitrofurantoin Diarrhea, Nausea And Vomiting        Medication List     STOP taking these medications    enalapril 10 MG tablet Commonly known as: VASOTEC   furosemide 40 MG tablet Commonly known as: Lasix   prazosin 2 MG capsule Commonly known as: MINIPRESS       TAKE these medications    amoxicillin-clavulanate 875-125 MG tablet Commonly known as: AUGMENTIN Take 1 tablet by mouth 2 (two) times daily for 2 days.   carbamazepine 200 MG tablet Commonly known as: TEGRETOL Take 200 mg by mouth 2 (two) times daily.   DULoxetine 60 MG capsule Commonly known as: CYMBALTA Take 60 mg by mouth daily.   escitalopram 10 MG tablet Commonly known as: LEXAPRO Take 10 mg by mouth daily.   gabapentin 600 MG tablet Commonly known as: NEURONTIN Take 0.5 tablets (300 mg total) by mouth 3 (three) times daily. What changed:  how much to take when to take this   Mounjaro 5 MG/0.5ML Pen Generic drug: tirzepatide Inject 10 mg into the skin once a week. Thursdays   NovoLOG ReliOn 100 UNIT/ML injection Generic drug: insulin aspart Inject 4-20 Units into the skin 3 (three) times daily with meals. Per sliding scale:70-130; 0 units, 130 to 180; 4 units; 180-240; 8 units; 241-300 ; 10 units; 301-350; 12 units; 351-400; 16 units. > 400 20 units and  call MD   pioglitazone 15 MG tablet Commonly known as: ACTOS Take 15 mg by mouth daily.   Tresiba 100 UNIT/ML Soln Generic drug: Insulin Degludec Inject 30 mLs into the muscle at bedtime.        Disposition and follow-up:   Ms.[name] was discharged from [facility] in Stable condition.  At the hospital follow up visit please address:  1.  Follow-up:  a.  Syncope, check orthostatics    b.  Bloody diarrhea-Ensure resolution of diarrhea.  Has not had colonoscopy done in some time needing GI follow-up and colonoscopy   c.  Acute on chronic kidney disease, urinary retention-no retention with voiding trial ensure this remains   d.  2.  Labs / imaging needed at time of follow-up: none  3.  Pending labs/ test needing follow-up: none  4.  Medication Changes Stop the Lasix and enalapril  Follow-up Appointments:  Follow-up Information     [name], Ander Purpura, FNP. Schedule an appointment as soon as possible for a visit in 1 week(s).   Specialty: Family Medicine Contact information: [address] [place] [id number] [place] [facility] Course by problem list:  Patient presented with complaints of syncope while having bowel movement.  Orthostatics were checked which were noted to be positive.  She was noted to be profoundly dehydrated likely related to medication misuse, since her Lasix had been discontinued previously but she continue to take it.  She was given volume resuscitation.  Blood pressure much improved, was noted to have intermittent episodes of hypertension.  She was evaluated for possible adrenal insufficiency with a.m. cortisol which returned negative.  Syncope felt mostly related to orthostatic hypotension with component of vasovagal from bowel movement.  Colitis - Patient developed acute bloody diarrhea first day of admission.  Patient was not felt to be septic as her hypotension and renal  dysfunction were better explained by hypovolemia in the setting of dehydration rather than sepsis.  GI panel and C. difficile were checked which returned negative.  Initial CT of the abdomen pelvis obtained in the emergency department did not show any obvious etiology for the bleed.  Discussed with radiology who felt that there was possible small amount of sigmoid colitis due to some bowel wall thickening but this was an equivocal finding.  Patient continued to have large-volume bloody bowel movements, so GI was consulted who recommend CT angio of the abdomen and pelvis be obtained if she continue to have bleeding, which she did.  CT angio did not show any overt bleeding but did better characterize colitis extending from sigmoid to transverse colon.  Given distribution etiology for colitis was thought to be ischemic versus infectious.  She was initiated on antibiotics Zosyn, and demonstrated improvement.  Blood, volume, consistency diarrhea all showed resolution with improvement in her lower abdominal pain.  He was transitioned over to oral antibiotics and discharged home in stable condition.  Discharge Subjective: Feeling better. 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, healing ulcer on the plantar aspect of the R foot Neuro: A&Ox4, CN 2-12 grossly intact  Pertinent Labs, Studies, and Procedures:     Latest Ref Rng & Units 07/01/2022    1:35 AM 06/30/2022    2:37 AM 06/29/2022    2:30 AM  CBC  WBC 4.0 - 10.5 K/uL 10.0  11.4  9.4   Hemoglobin 12.0 - 15.0 g/dL 10.4  10.1  10.0   Hematocrit 36.0 - 46.0 % 31.2  29.9   32.1   Platelets 150 - 400 K/uL 216  179  162        Latest Ref Rng & Units 07/01/2022    1:35 AM 06/30/2022    2:37 AM 06/29/2022    2:30 AM  CMP  Glucose 70 - 99 mg/dL 135  114  133   BUN 6 - 20 mg/dL '19  24  31   '$ Creatinine 0.44 - 1.00 mg/dL 1.54  1.62  1.70   Sodium 135 - 145 mmol/L 139  140  139   Potassium 3.5 - 5.1 mmol/L 3.6  3.7  4.1   Chloride 98 - 111 mmol/L 109  107  109   CO2 22 - 32 mmol/L '22  24  23   '$ Calcium 8.9 - 10.3 mg/dL 8.1  7.8  7.7  CT ABDOMEN PELVIS W CONTRAST  Result Date: 06/28/2022 CLINICAL DATA:  Syncope.  Abdominal pain, acute, nonlocalized EXAM: CT ABDOMEN AND PELVIS WITH CONTRAST TECHNIQUE: Multidetector CT imaging of the abdomen and pelvis was performed using the standard protocol following bolus administration of intravenous contrast. RADIATION DOSE REDUCTION: This exam was performed according to the departmental dose-optimization program which includes automated exposure control, adjustment of the mA and/or kV according to patient size and/or use of iterative reconstruction technique. CONTRAST:  39m OMNIPAQUE IOHEXOL 350 MG/ML SOLN COMPARISON:  02/06/2022 FINDINGS: Lower chest: No acute abnormality. Hepatobiliary: No focal liver abnormality is seen. Status post cholecystectomy. No biliary dilatation. Pancreas: No focal abnormality or ductal dilatation. Spleen: No focal abnormality.  Normal size. 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 Partially enhancing mass seen within the lower pole of the left kidney measuring 4.5 cm compared to 2.8 cm previously concerning for renal cell carcinoma. No stones or hydronephrosis. Urinary bladder unremarkable. Stomach/Bowel: Stomach, large and small bowel grossly unremarkable. Vascular/Lymphatic: Aortic atherosclerosis. No evidence of aneurysm or adenopathy. Reproductive: Prior hysterectomy.  No adnexal masses. Other: No free fluid or free air.  Musculoskeletal: No acute bony abnormality. IMPRESSION: 4.5 cm enhancing lesion in the lower pole of the left kidney, enlarging from 2.8 cm previously. This is concerning for renal cell carcinoma. Recommend urology consultation and further characterization with renal protocol MRI. Aortic atherosclerosis. Electronically Signed   By: KRolm BaptiseM.D.   On: 06/28/2022 03:01   CT Head Wo Contrast  Result Date: 06/28/2022 CLINICAL DATA:  Mental status change, unknown cause. Syncopal episode. EXAM: CT HEAD WITHOUT CONTRAST TECHNIQUE: Contiguous axial images were obtained from the base of the skull through the vertex without intravenous contrast. RADIATION DOSE REDUCTION: This exam was performed according to the departmental dose-optimization program which includes automated exposure control, adjustment of the mA and/or kV according to patient size and/or use of iterative reconstruction technique. COMPARISON:  05/16/2021. FINDINGS: Brain: No acute intracranial hemorrhage, midline shift or mass effect. No extra-axial fluid collection. Gray-white matter differentiation is within normal limits. No hydrocephalus. Vascular: No hyperdense vessel or unexpected calcification. Skull: Normal. Negative for fracture or focal lesion. Sinuses/Orbits: Mild mucosal thickening in the left maxillary sinus. No acute orbital abnormality. Other: None. IMPRESSION: No acute intracranial process. Electronically Signed   By: LBrett FairyM.D.   On: 06/28/2022 03:00     Discharge Instructions: Discharge Instructions     Call MD for:  difficulty breathing, headache or visual disturbances   Complete by: As directed    Call MD for:  extreme fatigue   Complete by: As directed    Call MD for:  hives   Complete by: As directed    Call MD for:  persistant dizziness or light-headedness   Complete by: As directed    Call MD for:  persistant nausea and vomiting   Complete by: As directed    Call MD for:  redness, tenderness, or signs of  infection (pain, swelling, redness, odor or green/yellow discharge around incision site)   Complete by: As directed    Call MD for:  severe uncontrolled pain   Complete by: As directed    Call MD for:  temperature >100.4   Complete by: As directed    Increase activity slowly   Complete by: As directed        Signed: GDelene Ruffini MD 07/01/2022, 3:22 PM   Pager: [phone]

## 2022-07-01 NOTE — Discharge Instructions (Addendum)
Dear [name],  Thank you for trusting [place] with your care.  We treated you in the hospital for loss of consciousness, low blood pressure, and diarrheal illness. You were very dehydrated when you came in in required several liters of IV fluids.  Blood pressure was low because of how dehydrated you were. The Lasix was likely medicine that was dehydrating you.  We recommend that you hold off on the Lasix until you can follow-up with your primary doctor.  We also held your enalapril.  Your blood pressures have been in the normal range without it.  We recommend that you talk with your doctor prior to restarting this medicine as well. We treated you with antibiotics for the diarrhea.  We started antibiotics while you are here in the hospital, we recommend that you take 2 more days of oral antibiotics to complete a 5-day course.  The GI doctors will also call you to schedule a follow-up colonoscopy. Please ensure that you are able to be seen by your primary care doctor in 1 to 2 weeks.

## 2022-07-01 NOTE — Plan of Care (Signed)
Patient continue to progress on current plan. No bleeding noted. HB at 10.4.Stool frequently decreased. Pt able to transition to/from [facility] with minimal assist. Denies any abdominal pain but received PRN tylenol for headache and reported relief. Call bell placed within reach.

## 2022-07-05 LAB — HEMOGLOBIN A1C

## 2022-08-03 ENCOUNTER — Ambulatory Visit (INDEPENDENT_AMBULATORY_CARE_PROVIDER_SITE_OTHER): Payer: Medicare Other

## 2022-08-03 ENCOUNTER — Ambulatory Visit: Payer: Self-pay

## 2022-08-03 ENCOUNTER — Ambulatory Visit (INDEPENDENT_AMBULATORY_CARE_PROVIDER_SITE_OTHER): Payer: Medicare Other | Admitting: Orthopedic Surgery

## 2022-08-03 DIAGNOSIS — M21072 Valgus deformity, not elsewhere classified, left ankle: Secondary | ICD-10-CM | POA: Diagnosis not present

## 2022-08-03 DIAGNOSIS — M25572 Pain in left ankle and joints of left foot: Secondary | ICD-10-CM | POA: Diagnosis not present

## 2022-08-03 DIAGNOSIS — M79671 Pain in right foot: Secondary | ICD-10-CM

## 2022-08-03 DIAGNOSIS — M79672 Pain in left foot: Secondary | ICD-10-CM

## 2022-08-03 DIAGNOSIS — M960 Pseudarthrosis after fusion or arthrodesis: Secondary | ICD-10-CM

## 2022-08-03 DIAGNOSIS — E11610 Type 2 diabetes mellitus with diabetic neuropathic arthropathy: Secondary | ICD-10-CM

## 2022-08-13 ENCOUNTER — Encounter: Payer: Self-pay | Admitting: Orthopedic Surgery

## 2022-08-13 NOTE — Progress Notes (Signed)
Office Visit Note   Patient: [name]           Date of Birth: Jun 04, 1961           MRN: [id number] Visit Date: 08/03/2022              Requested by: Pablo Lawrence, NP [address],  [place] [address] PCP: Pablo Lawrence, NP  Chief Complaint  Patient presents with   Right Foot - Open Wound   Left Foot - Pain   Left Ankle - Pain      HPI: Patient is a 62 year old woman who is seen for evaluation for painful both feet with deformity of the left ankle.  Patient states she had surgery in [place] 3 times with a crushed ankle.  Patient states that she did have intramedullary fixation and this got infected and the hardware was removed.  Patient states that her ankle turns and causes frequent falls and is painful.  Patient also complains of an ulcer on the Charcot rocker-bottom deformity of the right foot.  Assessment & Plan: Visit Diagnoses:  1. Bilateral foot pain   2. Pain in left ankle and joints of left foot   3. Acquired valgus deformity of left ankle   4. Pseudarthrosis after fusion or arthrodesis   5. 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  Discussed that with the calcified arteries and possible previous infection she is an increased risk of the wound not healing and increased risk of infection.  Discussed the potential for amputation.  Patient states she understands wished to proceed with surgery.  Follow-Up Instructions: Return in about 2 weeks (around 08/17/2022).   Ortho Exam  Patient is alert, oriented, no adenopathy, well-dressed, normal affect, normal respiratory effort. Examination patient has a rocker-bottom deformity of the right foot with a Wagner grade 1 ulcer.  After informed consent a 10 blade knife was used to debride the skin  and soft tissue back to healthy viable tissue.  The ulcer is 3 cm in diameter 1 mm deep without exposed bone or tendon no cellulitis.  Patient has palpable pulses bilaterally.  Examination of the left ankle patient has a valgus deformity of the left ankle with incisions per previous intramedullary tibiotalar calcaneal fusion.  There are no ulcers no cellulitis around the left foot or ankle.  Imaging: No results found. No images are attached to the encounter.  Labs: Lab Results  Component Value Date   HGBA1C QNSTST 06/29/2022   HGBA1C 5.9 (H) 12/21/2021   HGBA1C 9.8 (H) 05/16/2021   REPTSTATUS 04/08/2022 FINAL 04/03/2022   GRAMSTAIN  01/13/2022    RARE WBC PRESENT,BOTH PMN AND MONONUCLEAR ABUNDANT GRAM POSITIVE COCCI IN PAIRS ABUNDANT GRAM NEGATIVE RODS FEW GRAM POSITIVE RODS    CULT  04/03/2022    NO GROWTH 5 DAYS Performed at [facility], [address] [place], [place] [address]    LABORGA STAPHYLOCOCCUS EPIDERMIDIS 01/13/2022     Lab Results  Component Value Date   ALBUMIN 3.4 (L) 06/27/2022   ALBUMIN 2.6 (L) 04/04/2022   ALBUMIN 3.3 (L) 04/03/2022    Lab Results  Component Value Date   MG 2.2 06/27/2022   MG 2.2 04/05/2022   MG 2.0 04/04/2022   No results found  for: "VD25OH"  No results found for: "PREALBUMIN"    Latest Ref Rng & Units 07/01/2022    1:35 AM 06/30/2022    2:37 AM 06/29/2022    2:30 AM  CBC EXTENDED  WBC 4.0 - 10.5 K/uL 10.0  11.4  9.4   RBC 3.87 - 5.11 MIL/uL 3.41  3.22  3.32   Hemoglobin 12.0 - 15.0 g/dL 10.4  10.1  10.0   HCT 36.0 - 46.0 % 31.2  29.9  32.1   Platelets 150 - 400 K/uL 216  179  162      There is no height or weight on file to calculate BMI.  Orders:  Orders Placed This Encounter  Procedures   XR Foot 2 Views Right   XR Foot 2 Views Left   XR Ankle Complete Left   No orders of the defined types were placed in this encounter.    Procedures: No procedures performed  Clinical Data: No additional  findings.  ROS:  All other systems negative, except as noted in the HPI. Review of Systems  Objective: Vital Signs: There were no vitals taken for this visit.  Specialty Comments:  No specialty comments available.  PMFS History: Patient Active Problem List   Diagnosis Date Noted   Infectious colitis 06/29/2022   Syncope 06/28/2022   Renal cell carcinoma of left kidney ([place]) 06/28/2022   Hematochezia 06/28/2022   Acute kidney injury superimposed on chronic kidney disease ([place]) 04/03/2022   Type 2 diabetes mellitus with hyperglycemia ([place]) 04/03/2022   Hypotension 04/03/2022   Acute urinary retention 04/03/2022   Open thigh wound, right, sequela 04/03/2022   Actinomyces infection    Pressure injury of skin 01/14/2022   Sepsis ([place])    Postoperative respiratory failure ([place])    Necrotizing fasciitis ([place])    Necrotizing soft tissue infection 01/13/2022   Right foot ulcer ([place]) 12/23/2021   Ambulatory dysfunction 12/23/2021   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   Stage 3a chronic kidney disease (CKD) ([place]) 12/22/2021   Neuropathy 12/22/2021   Lesion of left native kidney 12/22/2021   Dehydration 11/23/2021   Morbid obesity ([place]) 11/23/2021   AKI (acute kidney injury) ([place]) 05/16/2021   Serum creatinine raised 08/24/2017   Atypical chest pain 07/12/2017   Chest pain 07/11/2017   Unstable angina ([place]) 07/11/2017   DIZZINESS 04/25/2010   CHEST PAIN, ATYPICAL 04/25/2010   ABDOMINAL PAIN 12/03/2009   PALPITATIONS 11/15/2009   Obesity (BMI 30-39.9) 10/07/2009   TROCHANTERIC BURSITIS, LEFT 10/07/2009   Type 2 diabetes mellitus ([place]) 09/30/2009   MDD (major depressive disorder) 09/30/2009   Migraine headache 09/30/2009   Essential hypertension 09/30/2009   GERD 04/15/2009   DIARRHEA, CHRONIC 04/15/2009   Past Medical History:  Diagnosis Date   Chest pain    a. 06/2017: cath showing normal cors with a preserved EF of 55-65%.    Depression    Diabetes  mellitus    Hypertension    Neuropathy    Pneumonia    Renal disorder    Sepsis ([place])     Family History  Problem Relation Age of Onset   Diabetes Mother    Heart failure Mother    Hypertension Mother    Cancer Mother    CAD Mother 80       Died of MI   Diabetes Father    Alcohol abuse Father     Past Surgical History:  Procedure Laterality Date   ABDOMINAL HYSTERECTOMY     APPENDECTOMY  CESAREAN SECTION     CHOLECYSTECTOMY     IRRIGATION AND DEBRIDEMENT BUTTOCKS Right 01/13/2022   Procedure: IRRIGATION AND DEBRIDEMENT BUTTOCKS AND THIGH;  Surgeon: Rusty Aus, DO;  Location: [facility];  Service: General;  Laterality: Right;   LEFT HEART CATH AND CORONARY ANGIOGRAPHY N/A 07/12/2017   Procedure: LEFT HEART CATH AND CORONARY ANGIOGRAPHY;  Surgeon: Lorretta Harp, MD;  Location: [place] [facility];  Service: Cardiovascular;  Laterality: N/A;   Social History   Occupational History   Occupation: Disability  Tobacco Use   Smoking status: Never   Smokeless tobacco: Never  Substance and Sexual Activity   Alcohol use: No   Drug use: No   Sexual activity: Yes    Birth control/protection: None

## 2022-08-31 ENCOUNTER — Ambulatory Visit (INDEPENDENT_AMBULATORY_CARE_PROVIDER_SITE_OTHER): Payer: 59 | Admitting: Orthopedic Surgery

## 2022-08-31 DIAGNOSIS — M960 Pseudarthrosis after fusion or arthrodesis: Secondary | ICD-10-CM | POA: Diagnosis not present

## 2022-08-31 DIAGNOSIS — M21072 Valgus deformity, not elsewhere classified, left ankle: Secondary | ICD-10-CM

## 2022-08-31 DIAGNOSIS — L97511 Non-pressure chronic ulcer of other part of right foot limited to breakdown of skin: Secondary | ICD-10-CM

## 2022-09-04 ENCOUNTER — Encounter: Payer: Self-pay | Admitting: Orthopedic Surgery

## 2022-09-04 NOTE — Progress Notes (Addendum)
Office Visit Note   Patient: [name]           Date of Birth: 12/24/1960           MRN: [id number] Visit Date: 08/31/2022              Requested by: Pablo Lawrence, NP [address] [place],  [place] [address] PCP: Pablo Lawrence, NP  Chief Complaint  Patient presents with   Left Ankle - Follow-up      HPI: Patient is a 62 year old woman who is seen in follow-up for left ankle deformity and pain.  Patient currently ambulates in a wheelchair.  Assessment & Plan: Visit Diagnoses:  1. Acquired valgus deformity of left ankle   2. Pseudarthrosis after fusion or arthrodesis     Plan: Ulcer was debrided patient tolerated this well.  In review of her radiographs the deformity and inability to walk patient states she would like to proceed with revision surgery for fusion of the left ankle.  Would plan for an anterior tibial talar fusion.  Risks and benefits were discussed including increased risk of infection need for additional surgery.  Patient states she understands wished to proceed at this time.  Follow-Up Instructions: Return if symptoms worsen or fail to improve.   Ortho Exam  Patient is alert, oriented, no adenopathy, well-dressed, normal affect, normal respiratory effort. Examination patient has a strong palpable dorsalis pedis pulse.  Radiograph shows calcified arteries she does have pes planus the ankle is collapsed into valgus status post intramedullary nail removal.  There are no lytic bony changes but evidence of the previous nail is visible with evidence of loosening of the nail.  She has Charcot ulceration of the midfoot.  After informed consent a 10 blade knife was used debride the skin and soft tissue back to healthy viable tissue.  The ulcer is 3 cm in diameter 1 mm deep after debridement.  Patient's hindfoot is an 45 degrees of valgus.  Imaging: No results found. No images are attached to the encounter.  Labs: Lab Results  Component Value  Date   HGBA1C QNSTST 06/29/2022   HGBA1C 5.9 (H) 12/21/2021   HGBA1C 9.8 (H) 05/16/2021   REPTSTATUS 04/08/2022 FINAL 04/03/2022   GRAMSTAIN  01/13/2022    RARE WBC PRESENT,BOTH PMN AND MONONUCLEAR ABUNDANT GRAM POSITIVE COCCI IN PAIRS ABUNDANT GRAM NEGATIVE RODS FEW GRAM POSITIVE RODS    CULT  04/03/2022    NO GROWTH 5 DAYS Performed at [facility], [address] [place], [place] [address]    LABORGA STAPHYLOCOCCUS EPIDERMIDIS 01/13/2022     Lab Results  Component Value Date   ALBUMIN 3.4 (L) 06/27/2022   ALBUMIN 2.6 (L) 04/04/2022   ALBUMIN 3.3 (L) 04/03/2022    Lab Results  Component Value Date   MG 2.2 06/27/2022   MG 2.2 04/05/2022   MG 2.0 04/04/2022   No results found for: "VD25OH"  No results found for: "PREALBUMIN"    Latest Ref Rng & Units 07/01/2022    1:35 AM 06/30/2022    2:37 AM 06/29/2022    2:30 AM  CBC EXTENDED  WBC 4.0 - 10.5 K/uL 10.0  11.4  9.4   RBC 3.87 - 5.11 MIL/uL 3.41  3.22  3.32   Hemoglobin 12.0 - 15.0 g/dL 10.4  10.1  10.0   HCT 36.0 - 46.0 % 31.2  29.9  32.1   Platelets 150 - 400 K/uL 216  179  162  There is no height or weight on file to calculate BMI.  Orders:  No orders of the defined types were placed in this encounter.  No orders of the defined types were placed in this encounter.    Procedures: No procedures performed  Clinical Data: No additional findings.  ROS:  All other systems negative, except as noted in the HPI. Review of Systems  Objective: Vital Signs: There were no vitals taken for this visit.  Specialty Comments:  No specialty comments available.  PMFS History: Patient Active Problem List   Diagnosis Date Noted   Infectious colitis 06/29/2022   Syncope 06/28/2022   Renal cell carcinoma of left kidney ([place]) 06/28/2022   Hematochezia 06/28/2022   Acute kidney injury superimposed on chronic kidney disease ([place]) 04/03/2022   Type 2 diabetes mellitus with hyperglycemia ([place]) 04/03/2022    Hypotension 04/03/2022   Acute urinary retention 04/03/2022   Open thigh wound, right, sequela 04/03/2022   Actinomyces infection    Pressure injury of skin 01/14/2022   Sepsis ([place])    Postoperative respiratory failure ([place])    Necrotizing fasciitis ([place])    Necrotizing soft tissue infection 01/13/2022   Right foot ulcer ([place]) 12/23/2021   Ambulatory dysfunction 12/23/2021   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   Stage 3a chronic kidney disease (CKD) ([place]) 12/22/2021   Neuropathy 12/22/2021   Lesion of left native kidney 12/22/2021   Dehydration 11/23/2021   Morbid obesity ([place]) 11/23/2021   AKI (acute kidney injury) ([place]) 05/16/2021   Serum creatinine raised 08/24/2017   Atypical chest pain 07/12/2017   Chest pain 07/11/2017   Unstable angina ([place]) 07/11/2017   DIZZINESS 04/25/2010   CHEST PAIN, ATYPICAL 04/25/2010   ABDOMINAL PAIN 12/03/2009   PALPITATIONS 11/15/2009   Obesity (BMI 30-39.9) 10/07/2009   TROCHANTERIC BURSITIS, LEFT 10/07/2009   Type 2 diabetes mellitus ([place]) 09/30/2009   MDD (major depressive disorder) 09/30/2009   Migraine headache 09/30/2009   Essential hypertension 09/30/2009   GERD 04/15/2009   DIARRHEA, CHRONIC 04/15/2009   Past Medical History:  Diagnosis Date   Chest pain    a. 06/2017: cath showing normal cors with a preserved EF of 55-65%.    Depression    Diabetes mellitus    Hypertension    Neuropathy    Pneumonia    Renal disorder    Sepsis ([place])     Family History  Problem Relation Age of Onset   Diabetes Mother    Heart failure Mother    Hypertension Mother    Cancer Mother    CAD Mother 49       Died of MI   Diabetes Father    Alcohol abuse Father     Past Surgical History:  Procedure Laterality Date   ABDOMINAL HYSTERECTOMY     APPENDECTOMY     CESAREAN SECTION     CHOLECYSTECTOMY     IRRIGATION AND DEBRIDEMENT BUTTOCKS Right 01/13/2022   Procedure: IRRIGATION AND DEBRIDEMENT BUTTOCKS AND THIGH;  Surgeon:  Rusty Aus, DO;  Location: [facility];  Service: General;  Laterality: Right;   LEFT HEART CATH AND CORONARY ANGIOGRAPHY N/A 07/12/2017   Procedure: LEFT HEART CATH AND CORONARY ANGIOGRAPHY;  Surgeon: Lorretta Harp, MD;  Location: [place] [facility];  Service: Cardiovascular;  Laterality: N/A;   Social History   Occupational History   Occupation: Disability  Tobacco Use   Smoking status: Never   Smokeless tobacco: Never  Substance and Sexual Activity   Alcohol use:  No   Drug use: No   Sexual activity: Yes    Birth control/protection: None

## 2022-10-25 NOTE — Pre-Procedure Instructions (Signed)
Surgical Instructions    Your procedure is scheduled on November 03, 2022.  Report to [facility] Main Entrance "A" at 5:30 A.M., then check in with the Admitting office.  Call this number if you have problems the morning of surgery:  [phone]  If you have any questions prior to your surgery date call [phone]: Open Monday-Friday 8am-4pm If you experience any cold or flu symptoms such as cough, fever, chills, shortness of breath, etc. between now and your scheduled surgery, please notify us at the above number.     Remember:  Do not eat after midnight the night before your surgery  You may drink clear liquids until 4:30 AM the morning of your surgery.   Clear liquids allowed are: Water, Non-Citrus Juices (without pulp), Carbonated Beverages, Clear Tea, Black Coffee Only (NO MILK, CREAM OR POWDERED CREAMER of any kind), and Gatorade.  Patient Instructions  The night before surgery:  No food after midnight. ONLY clear liquids after midnight  The day of surgery (if you have diabetes): Drink ONE (1) 12 oz G2 given to you in your pre admission testing appointment by 4:30 AM the morning of surgery. Drink in one sitting. Do not sip.  This drink was given to you during your hospital  pre-op appointment visit.  Nothing else to drink after completing the  12 oz bottle of G2.         If you have questions, please contact your surgeon's office.     Take these medicines the morning of surgery with A SIP OF WATER:  carbamazepine (TEGRETOL)   DULoxetine (CYMBALTA)   escitalopram (LEXAPRO)   gabapentin (NEURONTIN)    As of today, STOP taking any Aspirin (unless otherwise instructed by your surgeon) Aleve, Naproxen, Ibuprofen, Motrin, Advil, Goody's, BC's, all herbal medications, fish oil, and all vitamins.  WHAT DO I DO ABOUT MY DIABETES MEDICATION?   Do not take pioglitazone (ACTOS) the morning of surgery.  THE NIGHT BEFORE SURGERY, take 15 units of TRESIBA.     STOP taking your  [facility] one week prior to surgery.  If your CBG is greater than 220 mg/dL, you may take  of your sliding scale (NOVOLOG RELION insulin.   HOW TO MANAGE YOUR DIABETES BEFORE AND AFTER SURGERY  Why is it important to control my blood sugar before and after surgery? Improving blood sugar levels before and after surgery helps healing and can limit problems. A way of improving blood sugar control is eating a healthy diet by:  Eating less sugar and carbohydrates  Increasing activity/exercise  Talking with your doctor about reaching your blood sugar goals High blood sugars (greater than 180 mg/dL) can raise your risk of infections and slow your recovery, so you will need to focus on controlling your diabetes during the weeks before surgery. Make sure that the doctor who takes care of your diabetes knows about your planned surgery including the date and location.  How do I manage my blood sugar before surgery? Check your blood sugar at least 4 times a day, starting 2 days before surgery, to make sure that the level is not too high or low.  Check your blood sugar the morning of your surgery when you wake up and every 2 hours until you get to the Short Stay unit.  If your blood sugar is less than 70 mg/dL, you will need to treat for low blood sugar: Do not take insulin. Treat a low blood sugar (less than 70 mg/dL) with  cup  of clear juice (cranberry or apple), 4 glucose tablets, OR glucose gel. Recheck blood sugar in 15 minutes after treatment (to make sure it is greater than 70 mg/dL). If your blood sugar is not greater than 70 mg/dL on recheck, call [phone] for further instructions. Report your blood sugar to the short stay nurse when you get to Short Stay.  If you are admitted to the hospital after surgery: Your blood sugar will be checked by the staff and you will probably be given insulin after surgery (instead of oral diabetes medicines) to make sure you have good blood sugar  levels. The goal for blood sugar control after surgery is 80-180 mg/dL.                      Do NOT Smoke (Tobacco/Vaping) for 24 hours prior to your procedure.  If you use a CPAP at night, you may bring your mask/headgear for your overnight stay.   Contacts, glasses, piercing's, hearing aid's, dentures or partials may not be worn into surgery, please bring cases for these belongings.    For patients admitted to the hospital, discharge time will be determined by your treatment team.   Patients discharged the day of surgery will not be allowed to drive home, and someone needs to stay with them for 24 hours.  SURGICAL WAITING ROOM VISITATION Patients having surgery or a procedure may have no more than 2 support people in the waiting area - these visitors may rotate.   Children under the age of 65 must have an adult with them who is not the patient. If the patient needs to stay at the hospital during part of their recovery, the visitor guidelines for inpatient rooms apply. Pre-op nurse will coordinate an appropriate time for 1 support person to accompany patient in pre-op.  This support person may not rotate.   Please refer to the [facility] website for the visitor guidelines for Inpatients (after your surgery is over and you are in a regular room).    Special instructions:   [place]- Preparing For Surgery  Before surgery, you can play an important role. Because skin is not sterile, your skin needs to be as free of germs as possible. You can reduce the number of germs on your skin by washing with CHG (chlorahexidine gluconate) Soap before surgery.  CHG is an antiseptic cleaner which kills germs and bonds with the skin to continue killing germs even after washing.    Oral Hygiene is also important to reduce your risk of infection.  Remember - BRUSH YOUR TEETH THE MORNING OF SURGERY WITH YOUR REGULAR TOOTHPASTE  Please do not use if you have an allergy to CHG or antibacterial soaps. If  your skin becomes reddened/irritated stop using the CHG.  Do not shave (including legs and underarms) for at least 48 hours prior to first CHG shower. It is OK to shave your face.  Please follow these instructions carefully.   Shower the NIGHT BEFORE SURGERY and the MORNING OF SURGERY  If you chose to wash your hair, wash your hair first as usual with your normal shampoo.  After you shampoo, rinse your hair and body thoroughly to remove the shampoo.  Use CHG Soap as you would any other liquid soap. You can apply CHG directly to the skin and wash gently with a scrungie or a clean washcloth.   Apply the CHG Soap to your body ONLY FROM THE NECK DOWN.  Do not use on open  wounds or open sores. Avoid contact with your eyes, ears, mouth and genitals (private parts). Wash Face and genitals (private parts)  with your normal soap.   Wash thoroughly, paying special attention to the area where your surgery will be performed.  Thoroughly rinse your body with warm water from the neck down.  DO NOT shower/wash with your normal soap after using and rinsing off the CHG Soap.  Pat yourself dry with a CLEAN TOWEL.  Wear CLEAN PAJAMAS to bed the night before surgery  Place CLEAN SHEETS on your bed the night before your surgery  DO NOT SLEEP WITH PETS.   Day of Surgery: Take a shower with CHG soap. Do not wear jewelry or makeup Do not wear lotions, powders, perfumes/colognes, or deodorant. Do not shave 48 hours prior to surgery.  Men may shave face and neck. Do not bring valuables to the hospital.  [facility] is not responsible for any belongings or valuables. Do not wear nail polish, gel polish, artificial nails, or any other type of covering on natural nails (fingers and toes) If you have artificial nails or gel coating that need to be removed by a nail salon, please have this removed prior to surgery. Artificial nails or gel coating may interfere with anesthesia's ability to adequately monitor  your vital signs.  Wear Clean/Comfortable clothing the morning of surgery Remember to brush your teeth WITH YOUR REGULAR TOOTHPASTE.   Please read over the following fact sheets that you were given.    If you received a COVID test during your pre-op visit  it is requested that you wear a mask when out in public, stay away from anyone that may not be feeling well and notify your surgeon if you develop symptoms. If you have been in contact with anyone that has tested positive in the last 10 days please notify you surgeon.

## 2022-10-26 ENCOUNTER — Inpatient Hospital Stay (HOSPITAL_COMMUNITY)
Admission: RE | Admit: 2022-10-26 | Discharge: 2022-10-26 | Disposition: A | Discharge status: Home / Self Care | Payer: 59 | Source: Ambulatory Visit

## 2022-11-01 ENCOUNTER — Encounter (HOSPITAL_COMMUNITY): Payer: Self-pay | Admitting: Emergency Medicine

## 2022-11-01 ENCOUNTER — Encounter (HOSPITAL_COMMUNITY): Payer: Self-pay | Admitting: Orthopedic Surgery

## 2022-11-01 NOTE — Progress Notes (Addendum)
Surgery (11/03/22) cancelled per Daughter Miranda. Family emergency. Patient will call back to r/s surgery at a later date with Dr Lajoyce Corners.

## 2022-11-03 ENCOUNTER — Ambulatory Visit (HOSPITAL_COMMUNITY): Admission: RE | Admit: 2022-11-03 | Payer: 59 | Source: Home / Self Care | Admitting: Orthopedic Surgery

## 2022-11-03 HISTORY — DX: Dependence on wheelchair: Z99.3

## 2022-11-03 SURGERY — ANKLE ARTHROSCOPY WITH FUSION
Anesthesia: Choice | Site: Ankle | Laterality: Left

## 2022-11-17 ENCOUNTER — Encounter: Payer: 59 | Admitting: Family

## 2023-02-02 ENCOUNTER — Other Ambulatory Visit (HOSPITAL_COMMUNITY): Payer: Self-pay

## 2023-02-02 MED ORDER — MOUNJARO 15 MG/0.5ML ~~LOC~~ SOAJ
15.0000 mg | SUBCUTANEOUS | 3 refills | Status: AC
  Filled 2023-02-02 – 2023-02-26 (×3): qty 2, 28d supply, fill #0
  Filled 2023-03-28: qty 2, 28d supply, fill #1
  Filled 2023-04-04 – 2023-04-27 (×2): qty 2, 28d supply, fill #0
  Filled 2023-05-25: qty 2, 28d supply, fill #1
  Filled 2023-06-22: qty 2, 28d supply, fill #2
  Filled 2023-06-25 – 2023-07-18 (×2): qty 2, 28d supply, fill #3
  Filled 2023-08-15: qty 2, 28d supply, fill #4
  Filled 2023-09-12: qty 2, 28d supply, fill #5

## 2023-02-26 ENCOUNTER — Other Ambulatory Visit (HOSPITAL_BASED_OUTPATIENT_CLINIC_OR_DEPARTMENT_OTHER): Payer: Self-pay

## 2023-03-30 ENCOUNTER — Emergency Department (HOSPITAL_BASED_OUTPATIENT_CLINIC_OR_DEPARTMENT_OTHER): Payer: 59 | Admitting: Radiology

## 2023-03-30 ENCOUNTER — Emergency Department (HOSPITAL_BASED_OUTPATIENT_CLINIC_OR_DEPARTMENT_OTHER)
Admission: EM | Admit: 2023-03-30 | Discharge: 2023-03-30 | Disposition: A | Discharge status: Home / Self Care | Payer: 59 | Attending: Emergency Medicine | Admitting: Emergency Medicine

## 2023-03-30 ENCOUNTER — Emergency Department (HOSPITAL_BASED_OUTPATIENT_CLINIC_OR_DEPARTMENT_OTHER): Payer: 59

## 2023-03-30 ENCOUNTER — Other Ambulatory Visit: Payer: Self-pay

## 2023-03-30 DIAGNOSIS — R300 Dysuria: Secondary | ICD-10-CM

## 2023-03-30 DIAGNOSIS — D1779 Benign lipomatous neoplasm of other sites: Secondary | ICD-10-CM | POA: Insufficient documentation

## 2023-03-30 DIAGNOSIS — Z794 Long term (current) use of insulin: Secondary | ICD-10-CM | POA: Insufficient documentation

## 2023-03-30 DIAGNOSIS — I700 Atherosclerosis of aorta: Secondary | ICD-10-CM | POA: Diagnosis not present

## 2023-03-30 DIAGNOSIS — Z993 Dependence on wheelchair: Secondary | ICD-10-CM | POA: Insufficient documentation

## 2023-03-30 DIAGNOSIS — Z9049 Acquired absence of other specified parts of digestive tract: Secondary | ICD-10-CM | POA: Diagnosis not present

## 2023-03-30 DIAGNOSIS — E1140 Type 2 diabetes mellitus with diabetic neuropathy, unspecified: Secondary | ICD-10-CM | POA: Insufficient documentation

## 2023-03-30 LAB — CBC WITH DIFFERENTIAL/PLATELET
Abs Immature Granulocytes: 0.04 10*3/uL (ref 0.00–0.07)
Basophils Absolute: 0 10*3/uL (ref 0.0–0.1)
Basophils Relative: 1 %
Eosinophils Absolute: 0.3 10*3/uL (ref 0.0–0.5)
Eosinophils Relative: 4 %
HCT: 37.7 % (ref 36.0–46.0)
Hemoglobin: 12.6 g/dL (ref 12.0–15.0)
Immature Granulocytes: 1 %
Lymphocytes Relative: 34 %
Lymphs Abs: 2 10*3/uL (ref 0.7–4.0)
MCH: 30.8 pg (ref 26.0–34.0)
MCHC: 33.4 g/dL (ref 30.0–36.0)
MCV: 92.2 fL (ref 80.0–100.0)
Monocytes Absolute: 0.4 10*3/uL (ref 0.1–1.0)
Monocytes Relative: 6 %
Neutro Abs: 3.3 10*3/uL (ref 1.7–7.7)
Neutrophils Relative %: 54 %
Platelets: 198 10*3/uL (ref 150–400)
RBC: 4.09 MIL/uL (ref 3.87–5.11)
RDW: 12.7 % (ref 11.5–15.5)
WBC: 6 10*3/uL (ref 4.0–10.5)
nRBC: 0 % (ref 0.0–0.2)

## 2023-03-30 LAB — COMPREHENSIVE METABOLIC PANEL
ALT: 16 U/L (ref 0–44)
AST: 20 U/L (ref 15–41)
Albumin: 3.7 g/dL (ref 3.5–5.0)
Alkaline Phosphatase: 109 U/L (ref 38–126)
Anion gap: 7 (ref 5–15)
BUN: 21 mg/dL — ABNORMAL HIGH (ref 6–20)
CO2: 27 mmol/L (ref 22–32)
Calcium: 8.2 mg/dL — ABNORMAL LOW (ref 8.9–10.3)
Chloride: 103 mmol/L (ref 98–111)
Creatinine, Ser: 1.09 mg/dL — ABNORMAL HIGH (ref 0.44–1.00)
GFR, Estimated: 58 mL/min — ABNORMAL LOW (ref >60)
Glucose, Bld: 135 mg/dL — ABNORMAL HIGH (ref 70–99)
Potassium: 4.4 mmol/L (ref 3.5–5.1)
Sodium: 137 mmol/L (ref 135–145)
Total Bilirubin: 0.4 mg/dL (ref 0.3–1.2)
Total Protein: 6.6 g/dL (ref 6.5–8.1)

## 2023-03-30 LAB — URINALYSIS, W/ REFLEX TO CULTURE (INFECTION SUSPECTED)
Bacteria, UA: NONE SEEN
Bilirubin Urine: NEGATIVE
Glucose, UA: NEGATIVE mg/dL
Hgb urine dipstick: NEGATIVE
Ketones, ur: NEGATIVE mg/dL
Leukocytes,Ua: NEGATIVE
Nitrite: NEGATIVE
Specific Gravity, Urine: 1.012 (ref 1.005–1.030)
pH: 5.5 (ref 5.0–8.0)

## 2023-03-30 LAB — CBG MONITORING, ED: Glucose-Capillary: 133 mg/dL — ABNORMAL HIGH (ref 70–99)

## 2023-03-30 NOTE — ED Triage Notes (Addendum)
1 month UTI. 2 antibiotics tried. Seems to be getting worse.  Chills, fatigue, reduced appetite. Dysuria. Afebrile. -N,-V,+D. -CP, -SOB. Fell yesterday-weak-uses cane. Hit left flank. Unsure if kidney pain or pain from fall.   Hx diabetic- insulin, HTN, hyperlipidemia.

## 2023-03-30 NOTE — Discharge Instructions (Addendum)
You have been evaluated for your symptoms.  No obvious signs of urinary tract infection in your urine.  Your labs are overall reassuring.  CT scan did not show any concerning finding.  You do have a lesion in your left kidney that need close monitoring.  Discussed this with your doctor.

## 2023-03-30 NOTE — ED Provider Notes (Signed)
[place] EMERGENCY DEPARTMENT AT [facility] Provider Note   CSN: [id number] Arrival date & time: 03/30/23  1522     History  Chief Complaint  Patient presents with   Dysuria    [name] is a 62 y.o. female.  The history is provided by the patient, a relative and medical records. No language interpreter was used.  Dysuria    This is a 62 year old female significant history of diabetes, obesity, neuropathy, wheelchair dependent with prior surgical history including cholecystectomy, abdominal hysterectomy, appendectomy presenting with urinary symptoms.  Patient report for the past month she has had recurrent urinary discomfort which includes increased frequency, urgency, and burning urination.  She also endorses having some loose stools for the past 2 days.  She does not endorse any fever but does endorse some chills.  No chest pain or shortness of breath no runny nose sneezing or coughing.  She has been seen evaluate for her urinary symptoms for the past month.  She initially was treated with ciprofloxacin but then switched over to [facility] but recently due to no improvement of symptoms, her doctor switched her to Keflex for which she finished the full course but symptoms still progressed.  She has had urosepsis in the past.  Home Medications Prior to Admission medications   Medication Sig Start Date End Date Taking? Authorizing Provider  carbamazepine (TEGRETOL) 200 MG tablet Take 200 mg by mouth 2 (two) times daily. 02/19/22   [provider]  DULoxetine (CYMBALTA) 60 MG capsule Take 60 mg by mouth daily.    [provider]  escitalopram (LEXAPRO) 10 MG tablet Take 10 mg by mouth daily. 02/15/21   [provider]  gabapentin (NEURONTIN) 600 MG tablet Take 0.5 tablets (300 mg total) by mouth 3 (three) times daily. Patient taking differently: Take 600 mg by mouth in the morning, at noon, in the evening, and at bedtime. 05/18/21   Erick Blinks, MD  MOUNJARO 5 MG/0.5ML Pen Inject 10 mg into the skin once a week. Thursdays 11/23/21   [provider]  NOVOLOG RELION 100 UNIT/ML injection Inject 4-20 Units into the skin 3 (three) times daily with meals. Per sliding scale:70-130; 0 units, 130 to 180; 4 units; 180-240; 8 units; 241-300 ; 10 units; 301-350; 12 units; 351-400; 16 units. > 400 20 units and call MD 10/31/21   [provider]  pioglitazone (ACTOS) 15 MG tablet Take 15 mg by mouth daily. 03/22/22   [provider]  tirzepatide Greggory Keen) 15 MG/0.5ML Pen Inject 15 mg into the skin once a week. 02/02/23     TRESIBA 100 UNIT/ML SOLN Inject 30 mLs into the muscle at bedtime. 05/12/22   [provider]      Allergies    Ibuprofen, Red dye #40 (allura red), Codeine, and Nitrofurantoin    Review of Systems   Review of Systems  Genitourinary:  Positive for dysuria.  All other systems reviewed and are negative.   Physical Exam Updated Vital Signs BP (!) 120/59 (BP Location: Left Arm)   Pulse 80   Temp (!) 97.5 F (36.4 C)   Resp 16   Wt 106.6 kg   SpO2 98%   BMI 41.63 kg/m  Physical Exam Vitals and nursing note reviewed.  Constitutional:      General: She is not in acute distress.    Appearance: She is well-developed. She is obese.  HENT:     Head: Atraumatic.  Eyes:     Conjunctiva/sclera: Conjunctivae  normal.  Cardiovascular:     Rate and Rhythm: Normal rate and regular rhythm.     Pulses: Normal pulses.     Heart sounds: Normal heart sounds.  Pulmonary:     Effort: Pulmonary effort is normal.  Abdominal:     Palpations: Abdomen is soft.     Tenderness: There is abdominal tenderness (Mild suprapubic tenderness on palpation no guarding no rebound tenderness). There is right CVA tenderness. There is no left CVA tenderness.  Musculoskeletal:     Cervical back: Neck supple.  Skin:    Findings: No rash.  Neurological:     Mental Status: She is alert.  Psychiatric:         Mood and Affect: Mood normal.     ED Results / Procedures / Treatments   Labs (all labs ordered are listed, but only abnormal results are displayed) Labs Reviewed  COMPREHENSIVE METABOLIC PANEL - Abnormal; Notable for the following components:      Result Value   Glucose, Bld 135 (*)    BUN 21 (*)    Creatinine, Ser 1.09 (*)    Calcium 8.2 (*)    GFR, Estimated 58 (*)    All other components within normal limits  URINALYSIS, W/ REFLEX TO CULTURE (INFECTION SUSPECTED) - Abnormal; Notable for the following components:   Protein, ur TRACE (*)    All other components within normal limits  CBG MONITORING, ED - Abnormal; Notable for the following components:   Glucose-Capillary 133 (*)    All other components within normal limits  CBC WITH DIFFERENTIAL/PLATELET    EKG None  Radiology DG Chest 2 View  Result Date: 03/30/2023 CLINICAL DATA:  UTI, chills, fatigue, reduced appetite EXAM: CHEST - 2 VIEW COMPARISON:  04/03/2022 FINDINGS: Low lung volumes. Unchanged cardiac and mediastinal silhouette. No focal pulmonary opacity. No pleural effusion or pneumothorax. No acute osseous abnormality. IMPRESSION: Low lung volumes without acute cardiopulmonary process. Electronically Signed   By: Wiliam Ke M.D.   On: 03/30/2023 17:59   CT Renal Stone Study  Result Date: 03/30/2023 CLINICAL DATA:  Left flank pain after fall yesterday. EXAM: CT ABDOMEN AND PELVIS WITHOUT CONTRAST TECHNIQUE: Multidetector CT imaging of the abdomen and pelvis was performed following the standard protocol without IV contrast. RADIATION DOSE REDUCTION: This exam was performed according to the departmental dose-optimization program which includes automated exposure control, adjustment of the mA and/or kV according to patient size and/or use of iterative reconstruction technique. COMPARISON:  June 28, 2022. FINDINGS: Lower chest: Minimal right basilar subsegmental atelectasis. Hepatobiliary: No focal liver abnormality is  seen. Status post cholecystectomy. No biliary dilatation. Pancreas: Unremarkable. No pancreatic ductal dilatation or surrounding inflammatory changes. Spleen: Normal in size without focal abnormality. Adrenals/Urinary Tract: Stable 4.5 cm left adrenal myelolipoma. Right adrenal gland is unremarkable. No renal or ureteral calculi are noted. No hydronephrosis or renal obstruction is noted. Urinary bladder is unremarkable. 2.8 cm rounded partially exophytic complex low density is seen arising from lower pole of left kidney which is decreased in size compared to prior exam. Stomach/Bowel: The stomach is unremarkable. Status post appendectomy. There is no evidence of bowel obstruction or inflammation. Vascular/Lymphatic: Aortic atherosclerosis. No enlarged abdominal or pelvic lymph nodes. Reproductive: Status post hysterectomy. No adnexal masses. Other: No abdominal wall hernia or abnormality. No abdominopelvic ascites. Musculoskeletal: No acute or significant osseous findings. IMPRESSION: Stable 4.5 cm left adrenal myelolipoma. 2.8 cm rounded partially exophytic complex low density is seen arising from lower pole of left kidney which  appears to be slightly decreased in size compared to prior exam; etiology is uncertain, and MRI with and without gadolinium is recommended for further evaluation. No acute abnormality seen in the abdomen or pelvis. Aortic Atherosclerosis (ICD10-I70.0). Electronically Signed   By: Lupita Raider M.D.   On: 03/30/2023 17:48    Procedures Procedures    Medications Ordered in ED Medications - No data to display  ED Course/ Medical Decision Making/ A&P                                 Medical Decision Making Amount and/or Complexity of Data Reviewed Labs: ordered. Radiology: ordered.   BP (!) 120/59 (BP Location: Left Arm)   Pulse 80   Temp (!) 97.5 F (36.4 C)   Resp 16   Wt 106.6 kg   SpO2 98%   BMI 41.63 kg/m   4:59 PM This is a 62 year old female significant  history of diabetes, obesity, neuropathy, wheelchair dependent with prior surgical history including cholecystectomy, abdominal hysterectomy, appendectomy presenting with urinary symptoms.  Patient report for the past month she has had recurrent urinary discomfort which includes increased frequency, urgency, and burning urination.  She also endorses having some loose stools for the past 2 days.  She does not endorse any fever but does endorse some chills.  No chest pain or shortness of breath no runny nose sneezing or coughing.  She has been seen evaluate for her urinary symptoms for the past month.  She initially was treated with ciprofloxacin but then switched over to [facility] but recently due to no improvement of symptoms, her doctor switched her to Keflex for which she finished the full course but symptoms still progressed.  She has had urosepsis in the past.  On exam, this is an obese female resting comfortably in bed appears to be in no acute discomfort.  Heart with normal rate and rhythm, lungs clear to auscultation bilaterally abdomen is soft with some mild suprapubic tenderness and some mild tenderness to percussion of her right flank.  -Labs ordered, independently viewed and interpreted by me.  Labs remarkable for value at baseline -The patient was maintained on a cardiac monitor.  I personally viewed and interpreted the cardiac monitored which showed an underlying rhythm of: NSR -Imaging independently viewed and interpreted by me and I agree with radiologist's interpretation.  Result remarkable for CXR without acute changes, CT abd/pelvis showing no acute finding.  L kidney lesion decreased in size from prior.  Recommend outpt MRI for further eval -This patient presents to the ED for concern of urinary discomfort, this involves an extensive number of treatment options, and is a complaint that carries with it a high risk of complications and morbidity.  The differential diagnosis includes UTI,  pyelo, kidney stone, colitis, appendicitis -Co morbidities that complicate the patient evaluation includes DM, obesity, CAD -Treatment includes none -Reevaluation of the patient after these medicines showed that the patient improved -PCP office notes or outside notes reviewed -Escalation to admission/observation considered: patients feels much better, is comfortable with discharge, and will follow up with PCP -Prescription medication considered, patient comfortable with home medication -Social Determinant of Health considered which includes social isolation, stress         Final Clinical Impression(s) / ED Diagnoses Final diagnoses:  Dysuria    Rx / DC Orders ED Discharge Orders     None  Fayrene Helper, PA-C 03/30/23 1840    Virgina Norfolk, DO 03/30/23 2229

## 2023-04-04 ENCOUNTER — Other Ambulatory Visit (HOSPITAL_BASED_OUTPATIENT_CLINIC_OR_DEPARTMENT_OTHER): Payer: Self-pay

## 2023-04-05 ENCOUNTER — Other Ambulatory Visit (HOSPITAL_COMMUNITY): Payer: Self-pay

## 2023-04-27 ENCOUNTER — Other Ambulatory Visit (HOSPITAL_COMMUNITY): Payer: Self-pay

## 2023-04-27 ENCOUNTER — Other Ambulatory Visit (HOSPITAL_BASED_OUTPATIENT_CLINIC_OR_DEPARTMENT_OTHER): Payer: Self-pay

## 2023-04-27 ENCOUNTER — Other Ambulatory Visit: Payer: Self-pay

## 2023-05-25 ENCOUNTER — Other Ambulatory Visit (HOSPITAL_COMMUNITY): Payer: Self-pay

## 2023-05-30 ENCOUNTER — Other Ambulatory Visit: Payer: Self-pay

## 2023-05-30 ENCOUNTER — Encounter (HOSPITAL_BASED_OUTPATIENT_CLINIC_OR_DEPARTMENT_OTHER): Payer: Self-pay | Admitting: Emergency Medicine

## 2023-05-30 ENCOUNTER — Emergency Department (HOSPITAL_BASED_OUTPATIENT_CLINIC_OR_DEPARTMENT_OTHER): Payer: 59

## 2023-05-30 ENCOUNTER — Emergency Department (HOSPITAL_BASED_OUTPATIENT_CLINIC_OR_DEPARTMENT_OTHER)
Admission: EM | Admit: 2023-05-30 | Discharge: 2023-05-30 | Disposition: A | Discharge status: Home / Self Care | Payer: 59 | Attending: Emergency Medicine | Admitting: Emergency Medicine

## 2023-05-30 DIAGNOSIS — R479 Unspecified speech disturbances: Secondary | ICD-10-CM | POA: Diagnosis not present

## 2023-05-30 DIAGNOSIS — R6883 Chills (without fever): Secondary | ICD-10-CM | POA: Insufficient documentation

## 2023-05-30 DIAGNOSIS — E1140 Type 2 diabetes mellitus with diabetic neuropathy, unspecified: Secondary | ICD-10-CM | POA: Insufficient documentation

## 2023-05-30 DIAGNOSIS — R531 Weakness: Secondary | ICD-10-CM | POA: Diagnosis present

## 2023-05-30 DIAGNOSIS — Z794 Long term (current) use of insulin: Secondary | ICD-10-CM | POA: Diagnosis not present

## 2023-05-30 DIAGNOSIS — Z79899 Other long term (current) drug therapy: Secondary | ICD-10-CM | POA: Diagnosis not present

## 2023-05-30 DIAGNOSIS — R410 Disorientation, unspecified: Secondary | ICD-10-CM | POA: Insufficient documentation

## 2023-05-30 DIAGNOSIS — R079 Chest pain, unspecified: Secondary | ICD-10-CM | POA: Insufficient documentation

## 2023-05-30 DIAGNOSIS — I10 Essential (primary) hypertension: Secondary | ICD-10-CM | POA: Insufficient documentation

## 2023-05-30 LAB — DIFFERENTIAL
Abs Immature Granulocytes: 0.04 10*3/uL (ref 0.00–0.07)
Basophils Absolute: 0.1 10*3/uL (ref 0.0–0.1)
Basophils Relative: 1 %
Eosinophils Absolute: 0.3 10*3/uL (ref 0.0–0.5)
Eosinophils Relative: 4 %
Immature Granulocytes: 1 %
Lymphocytes Relative: 29 %
Lymphs Abs: 2.1 10*3/uL (ref 0.7–4.0)
Monocytes Absolute: 0.5 10*3/uL (ref 0.1–1.0)
Monocytes Relative: 6 %
Neutro Abs: 4.5 10*3/uL (ref 1.7–7.7)
Neutrophils Relative %: 59 %

## 2023-05-30 LAB — URINALYSIS, ROUTINE W REFLEX MICROSCOPIC
Bacteria, UA: NONE SEEN
Bilirubin Urine: NEGATIVE
Glucose, UA: NEGATIVE mg/dL
Hgb urine dipstick: NEGATIVE
Ketones, ur: NEGATIVE mg/dL
Leukocytes,Ua: NEGATIVE
Nitrite: NEGATIVE
Protein, ur: 30 mg/dL — AB
Specific Gravity, Urine: 1.028 (ref 1.005–1.030)
pH: 7 (ref 5.0–8.0)

## 2023-05-30 LAB — COMPREHENSIVE METABOLIC PANEL
ALT: 8 U/L (ref 0–44)
AST: 13 U/L — ABNORMAL LOW (ref 15–41)
Albumin: 3.7 g/dL (ref 3.5–5.0)
Alkaline Phosphatase: 125 U/L (ref 38–126)
Anion gap: 8 (ref 5–15)
BUN: 17 mg/dL (ref 6–20)
CO2: 27 mmol/L (ref 22–32)
Calcium: 9.5 mg/dL (ref 8.9–10.3)
Chloride: 104 mmol/L (ref 98–111)
Creatinine, Ser: 1.08 mg/dL — ABNORMAL HIGH (ref 0.44–1.00)
GFR, Estimated: 59 mL/min — ABNORMAL LOW (ref >60)
Glucose, Bld: 142 mg/dL — ABNORMAL HIGH (ref 70–99)
Potassium: 4.8 mmol/L (ref 3.5–5.1)
Sodium: 139 mmol/L (ref 135–145)
Total Bilirubin: 0.4 mg/dL (ref <1.2)
Total Protein: 6.6 g/dL (ref 6.5–8.1)

## 2023-05-30 LAB — CARBAMAZEPINE LEVEL, TOTAL: Carbamazepine Lvl: 11.6 ug/mL (ref 4.0–12.0)

## 2023-05-30 LAB — RAPID URINE DRUG SCREEN, HOSP PERFORMED
Amphetamines: NOT DETECTED
Barbiturates: NOT DETECTED
Benzodiazepines: NOT DETECTED
Cocaine: NOT DETECTED
Opiates: NOT DETECTED
Tetrahydrocannabinol: NOT DETECTED

## 2023-05-30 LAB — CBC
HCT: 38.1 % (ref 36.0–46.0)
Hemoglobin: 12.7 g/dL (ref 12.0–15.0)
MCH: 30.5 pg (ref 26.0–34.0)
MCHC: 33.3 g/dL (ref 30.0–36.0)
MCV: 91.6 fL (ref 80.0–100.0)
Platelets: 205 10*3/uL (ref 150–400)
RBC: 4.16 MIL/uL (ref 3.87–5.11)
RDW: 12.4 % (ref 11.5–15.5)
WBC: 7.4 10*3/uL (ref 4.0–10.5)
nRBC: 0 % (ref 0.0–0.2)

## 2023-05-30 LAB — PROTIME-INR
INR: 1 (ref 0.8–1.2)
Prothrombin Time: 13.3 s (ref 11.4–15.2)

## 2023-05-30 LAB — ETHANOL: Alcohol, Ethyl (B): <10 mg/dL (ref <10)

## 2023-05-30 LAB — APTT: aPTT: 26 s (ref 24–36)

## 2023-05-30 MED ORDER — IOHEXOL 350 MG/ML SOLN
100.0000 mL | Freq: Once | INTRAVENOUS | Status: AC | PRN
  Administered 2023-05-30: 50 mL via INTRAVENOUS

## 2023-05-30 NOTE — Discharge Instructions (Signed)
Your workup was reassuring today.  Your speech appears to be back to normal.  Follow-up with your primary care doctor and neurology.  Neurology referral has been placed and hopefully they will be contacting you soon.

## 2023-05-30 NOTE — ED Provider Notes (Signed)
[place] EMERGENCY DEPARTMENT AT [facility] Provider Note   CSN: [id number] Arrival date & time: 05/30/23  1356     History  Chief Complaint  Patient presents with   Weakness    [name] is a 62 y.o. female.   Weakness Patient presented with feeling more weak and some confusion.  States she would go into a room and not really remember why she went in there.  Has been feeling bad for around a week now.  States she has had some chills.  No dysuria.  No cough.  Is in a wheelchair at baseline but no new or lateralizing numbness or weakness.    Past Medical History:  Diagnosis Date   Chest pain    a. 06/2017: cath showing normal cors with a preserved EF of 55-65%.    Depression    Diabetes mellitus    type 2   Hypertension    Neuropathy    Pneumonia    Renal disorder    Sepsis (HCC)    Wheelchair dependent     Home Medications Prior to Admission medications   Medication Sig Start Date End Date Taking? Authorizing Provider  carbamazepine (TEGRETOL) 200 MG tablet Take 200 mg by mouth 2 (two) times daily. 02/19/22   [provider]  DULoxetine (CYMBALTA) 60 MG capsule Take 60 mg by mouth daily.    [provider]  escitalopram (LEXAPRO) 10 MG tablet Take 10 mg by mouth daily. 02/15/21   [provider]  gabapentin (NEURONTIN) 600 MG tablet Take 0.5 tablets (300 mg total) by mouth 3 (three) times daily. Patient taking differently: Take 600 mg by mouth in the morning, at noon, in the evening, and at bedtime. 05/18/21   Erick Blinks, MD  MOUNJARO 5 MG/0.5ML Pen Inject 10 mg into the skin once a week. Thursdays 11/23/21   [provider]  NOVOLOG RELION 100 UNIT/ML injection Inject 4-20 Units into the skin 3 (three) times daily with meals. Per sliding scale:70-130; 0 units, 130 to 180; 4 units; 180-240; 8 units; 241-300 ; 10 units; 301-350; 12 units; 351-400; 16 units. > 400 20 units and call MD 10/31/21   [provider]  pioglitazone (ACTOS) 15 MG tablet Take 15 mg by mouth daily. 03/22/22   [provider]  tirzepatide Greggory Keen) 15 MG/0.5ML Pen Inject 15 mg into the skin once a week. 02/02/23     TRESIBA 100 UNIT/ML SOLN Inject 30 mLs into the muscle at bedtime. 05/12/22   [provider]      Allergies    Ibuprofen, Red dye #40 (allura red), Codeine, and Nitrofurantoin    Review of Systems   Review of Systems  Neurological:  Positive for weakness.    Physical Exam Updated Vital Signs BP (!) 155/57   Pulse 78   Temp 97.9 F (36.6 C) (Oral)   Resp 17   SpO2 97%  Physical Exam Vitals reviewed.  HENT:     Head: Atraumatic.  Cardiovascular:     Rate and Rhythm: Regular rhythm.  Abdominal:     Tenderness: There is no abdominal tenderness.  Musculoskeletal:        General: No tenderness.     Cervical back: Neck supple.     Right lower leg: No edema.     Left lower leg: No edema.  Skin:    General: Skin is warm.  Neurological:     Mental Status: She is alert and oriented to person, place, and  time.     Comments: Patient is awake and appropriate.  Slightly slow to answer.  Patient's son states she seemed as if she was drunk     ED Results / Procedures / Treatments   Labs (all labs ordered are listed, but only abnormal results are displayed) Labs Reviewed  COMPREHENSIVE METABOLIC PANEL - Abnormal; Notable for the following components:      Result Value   Glucose, Bld 142 (*)    Creatinine, Ser 1.08 (*)    AST 13 (*)    GFR, Estimated 59 (*)    All other components within normal limits  URINALYSIS, ROUTINE W REFLEX MICROSCOPIC - Abnormal; Notable for the following components:   Color, Urine COLORLESS (*)    Protein, ur 30 (*)    All other components within normal limits  ETHANOL  PROTIME-INR  APTT  CBC  DIFFERENTIAL  RAPID URINE DRUG SCREEN, HOSP PERFORMED  CARBAMAZEPINE LEVEL, TOTAL    EKG None  Radiology CT ANGIO HEAD NECK W WO  CM  Result Date: 05/30/2023 CLINICAL DATA:  Neuro deficit, acute, stroke suspected. Weakness and cognitive difficulty. EXAM: CT ANGIOGRAPHY HEAD AND NECK WITH AND WITHOUT CONTRAST TECHNIQUE: Multidetector CT imaging of the head and neck was performed using the standard protocol during bolus administration of intravenous contrast. Multiplanar CT image reconstructions and MIPs were obtained to evaluate the vascular anatomy. Carotid stenosis measurements (when applicable) are obtained utilizing NASCET criteria, using the distal internal carotid diameter as the denominator. RADIATION DOSE REDUCTION: This exam was performed according to the departmental dose-optimization program which includes automated exposure control, adjustment of the mA and/or kV according to patient size and/or use of iterative reconstruction technique. CONTRAST:  50mL OMNIPAQUE IOHEXOL 350 MG/ML SOLN COMPARISON:  Head CT 06/28/2022. FINDINGS: CT HEAD FINDINGS Brain: No acute intracranial hemorrhage. Gray-white differentiation is preserved. No hydrocephalus or extra-axial collection. No mass effect or midline shift. Vascular: No hyperdense vessel or unexpected calcification. Skull: No calvarial fracture or suspicious bone lesion. Skull base is unremarkable. Sinuses/Orbits: No acute finding. Other: None. CTA NECK FINDINGS Aortic arch: Three-vessel arch configuration. Arch vessel origins are patent. Right carotid system: No evidence of dissection, stenosis (50% or greater), or occlusion. Left carotid system: Moderate mixed plaque along the left carotid bulb results in less than 50% stenosis of the left ICA origin. Vertebral arteries: Codominant. No evidence of dissection, stenosis (50% or greater), or occlusion. Skeleton: Unremarkable. Other neck: Unremarkable. Upper chest: Unremarkable. Review of the MIP images confirms the above findings CTA HEAD FINDINGS Anterior circulation: Calcified plaque along the carotid siphons without hemodynamically  significant stenosis. The proximal ACAs and MCAs are patent without stenosis or aneurysm. Distal branches are symmetric. Posterior circulation: Normal basilar artery. The SCAs, AICAs and PICAs are patent proximally. The PCAs are patent proximally without stenosis or aneurysm. Distal branches are symmetric. Venous sinuses: As permitted by contrast timing, patent. Anatomic variants: None. Review of the MIP images confirms the above findings IMPRESSION: 1. No acute intracranial abnormality. 2. No large vessel occlusion, hemodynamically significant stenosis, or aneurysm in the head or neck. 3. Moderate mixed plaque along the left carotid bulb results in less than 50% stenosis of the left ICA origin. Electronically Signed   By: Orvan Falconer M.D.   On: 05/30/2023 18:07   DG Chest Portable 1 View  Result Date: 05/30/2023 CLINICAL DATA:  Weakness EXAM: PORTABLE CHEST 1 VIEW COMPARISON:  03/30/2023 FINDINGS: Low lung volumes are present, causing crowding of the pulmonary vasculature. Heart size within  normal limits for projection. The lungs appear clear. No blunting of the costophrenic angles. IMPRESSION: 1. Low lung volumes, causing crowding of the pulmonary vasculature. No acute findings. Electronically Signed   By: Gaylyn Rong M.D.   On: 05/30/2023 17:51    Procedures Procedures    Medications Ordered in ED Medications  iohexol (OMNIPAQUE) 350 MG/ML injection 100 mL (50 mLs Intravenous Contrast Given 05/30/23 1558)    ED Course/ Medical Decision Making/ A&P                                 Medical Decision Making Amount and/or Complexity of Data Reviewed Labs: ordered. Radiology: ordered.   Patient feeling better.  Some mild confusion.  Sent in from PCP.  Had negative urinalysis there.  Differential diagnoses long and include causes such as dehydration and infection.  Also medication reaction.  Patient has not been eating as much the last few days.  Will get basic blood work.  Will get  head CT.  Reviewed PCP note.  Workup reassuring.  CT and CTA reassuring.  No clear cause.  Tegretol level reassuring.  Now states she is back to baseline.  Speech is improved.  It was mildly slowed before but now appears completely fluent.  Happened after taking a nap.  However is somewhat worrisome just with the length of it.  Will have follow-up with neurology.  Appears low risk enough to be able to follow as an outpatient however.  Will discharge home.         Final Clinical Impression(s) / ED Diagnoses Final diagnoses:  Difficulty speaking    Rx / DC Orders ED Discharge Orders          Ordered    Ambulatory referral to Neurology       Comments: An appointment is requested in approximately: 1 week   05/30/23 Earlie Lou, MD 05/30/23 1857

## 2023-05-30 NOTE — ED Triage Notes (Signed)
Weakness and cognitive difficulty. Seen by provider today and sent for eval Also had some slurred speech. Since yesterday morning

## 2023-06-06 ENCOUNTER — Telehealth: Payer: Self-pay | Admitting: Neurology

## 2023-06-06 ENCOUNTER — Ambulatory Visit: Payer: 59 | Admitting: Neurology

## 2023-06-06 ENCOUNTER — Encounter: Payer: Self-pay | Admitting: Neurology

## 2023-06-06 VITALS — BP 114/60 | Ht 64.0 in | Wt 241.0 lb

## 2023-06-06 DIAGNOSIS — R4781 Slurred speech: Secondary | ICD-10-CM | POA: Diagnosis not present

## 2023-06-06 MED ORDER — ESCITALOPRAM OXALATE 20 MG PO TABS: 20.0000 mg | ORAL_TABLET | Freq: Every day | ORAL | 11 refills | Status: AC

## 2023-06-06 NOTE — Telephone Encounter (Signed)
no auth required sent to [facility] because she was in a wheelchair. [phone]

## 2023-06-06 NOTE — Progress Notes (Unsigned)
[facility]  PATIENT: [name] DOB: 02/13/1961  REQUESTING CLINICIAN: Benjiman Core, MD HISTORY FROM: Patient and daughter  REASON FOR VISIT: Speech disturbance, Anxiety    HISTORICAL  CHIEF COMPLAINT:  Chief Complaint  Patient presents with   New Patient (Initial Visit)    Rm 13, NP, episodes of difficulty speaking, with daughter Tamera Punt.     HISTORY OF PRESENT ILLNESS:   Discussed the use of AI scribe software for clinical note transcription with the patient, who gave verbal consent to proceed.  History of Present Illness   The patient, with a history of diabetes, hypertension, depression and neuropathy, presented with intermittent episodes of slurred speech, difficulty finding words, and unsteady gait leading to multiple falls. These episodes, described as 'spells', were characterized by garbled speech, jerky movements, and an inability to text due to uncoordinated hand movements. These episodes usually occur during period of high stress. The patient's daughter, who accompanied her, reported that these episodes were not consistent and could be absent for days or weeks at a time. The patient's symptoms were most severe on the day of a recent hospital visit, but resolved after eating and resting.  The patient's falls have resulted in injuries, including a broken ankle three years ago. The patient is scheduled for surgery to address this issue, but there are concerns about her overall stability and frequent falls. The patient's neuropathy, a result of long-standing diabetes, is severe and has led to a significant loss of sensation in the legs.  The patient has been on a regimen of gabapentin, taken four times a day, and Cymbalta for neuropathic pain. She also takes carbamazepine, initially prescribed for bipolar disorder. The patient has been on Lexapro for a long time for major depression and she tells me that her dose has note been adjusted.  The  patient's symptoms seem to be exacerbated by stress, lack of sleep, and not eating regularly. She has experienced significant weight loss and is trying to lose more weight. The patient's stress levels are reportedly high due to personal issues, including a strained long-distance relationship. Her partner is in [place] and unwilling to relocate to [facility] per patient.  The patient's symptoms do not fit a clear neurological pattern, and previous imaging and tests have been negative. The patient's daughter suggested that the high dose of gabapentin might be contributing to the patient's symptoms, but this is unconfirmed. The patient's goal is to improve mobility and get out of the wheelchair she currently uses.      OTHER MEDICAL CONDITIONS: Hypertension, Diabetes, Diabetic neuropathy, Depression    REVIEW OF SYSTEMS: Full 14 system review of systems performed and negative with exception of: As noted in the HPI   ALLERGIES: Allergies  Allergen Reactions   Ibuprofen Swelling   Red Dye #40 (Allura Red)     Other reaction(s): Nervous syncope   Codeine Palpitations   Nitrofurantoin Diarrhea and Nausea And Vomiting    HOME MEDICATIONS: Outpatient Medications Prior to Visit  Medication Sig Dispense Refill   carbamazepine (TEGRETOL) 200 MG tablet Take 200 mg by mouth 2 (two) times daily.     DULoxetine (CYMBALTA) 60 MG capsule Take 60 mg by mouth daily.     gabapentin (NEURONTIN) 600 MG tablet Take 0.5 tablets (300 mg total) by mouth 3 (three) times daily. (Patient taking differently: Take 600 mg by mouth in the morning, at noon, in the evening, and at bedtime.)     NOVOLOG RELION 100 UNIT/ML injection Inject  4-20 Units into the skin 3 (three) times daily with meals. Per sliding scale:70-130; 0 units, 130 to 180; 4 units; 180-240; 8 units; 241-300 ; 10 units; 301-350; 12 units; 351-400; 16 units. > 400 20 units and call MD     pioglitazone (ACTOS) 15 MG tablet Take 15 mg by mouth daily.      tirzepatide (MOUNJARO) 15 MG/0.5ML Pen Inject 15 mg into the skin once a week. 6 mL 3   TRESIBA 100 UNIT/ML SOLN Inject 18 mLs into the muscle at bedtime.     escitalopram (LEXAPRO) 10 MG tablet Take 10 mg by mouth daily.     MOUNJARO 5 MG/0.5ML Pen Inject 10 mg into the skin once a week. Thursdays     No facility-administered medications prior to visit.    PAST MEDICAL HISTORY: Past Medical History:  Diagnosis Date   Chest pain    a. 06/2017: cath showing normal cors with a preserved EF of 55-65%.    Depression    Diabetes mellitus    type 2   Hypertension    Neuropathy    Pneumonia    Renal disorder    Sepsis (HCC)    Wheelchair dependent     PAST SURGICAL HISTORY: Past Surgical History:  Procedure Laterality Date   ABDOMINAL HYSTERECTOMY     APPENDECTOMY     CESAREAN SECTION     CHOLECYSTECTOMY     IRRIGATION AND DEBRIDEMENT BUTTOCKS Right 01/13/2022   Procedure: IRRIGATION AND DEBRIDEMENT BUTTOCKS AND THIGH;  Surgeon: Lewie Chamber, DO;  Location: [facility];  Service: General;  Laterality: Right;   LEFT HEART CATH AND CORONARY ANGIOGRAPHY N/A 07/12/2017   Procedure: LEFT HEART CATH AND CORONARY ANGIOGRAPHY;  Surgeon: Runell Gess, MD;  Location: [facility]AS[facility];  Service: Cardiovascular;  Laterality: N/A;    FAMILY HISTORY: Family History  Problem Relation Age of Onset   Diabetes Mother    Heart failure Mother    Hypertension Mother    Cancer Mother    CAD Mother 90       Died of MI   Diabetes Father    Alcohol abuse Father     SOCIAL HISTORY: Social History   Socioeconomic History   Marital status: Divorced    Spouse name: Not on file   Number of children: Not on file   Years of education: Not on file   Highest education level: Not on file  Occupational History   Occupation: Disability  Tobacco Use   Smoking status: Never   Smokeless tobacco: Never  Substance and Sexual Activity   Alcohol use: No   Drug use: No   Sexual activity:  Yes    Birth control/protection: None  Other Topics Concern   Not on file  Social History Narrative   Left handed   Caffeine-1 cup daily   Lives alone, wheelchair dependent, has life alert   Social Determinants of Health   Financial Resource Strain: Low Risk  (01/30/2023)   Received from [facility]   Overall Financial Resource Strain (CARDIA)    Difficulty of Paying Living Expenses: Not very hard  Food Insecurity: No Food Insecurity (01/30/2023)   [facility]   Hunger Vital Sign    Worried About Running Out of Food in the Last Year: Never true    Ran Out of Food in the Last Year: Never true  Transportation Needs: No Transportation Needs (01/30/2023)   Received from [facility] - Transportation  Lack of Transportation (Medical): No    Lack of Transportation (Non-Medical): No  Physical Activity: Insufficiently Active (01/30/2023)   Received from [facility]   Exercise Vital Sign    Days of Exercise per Week: 4 days    Minutes of Exercise per Session: 20 min  Stress: Stress Concern Present (01/30/2023)   Received from [facility] - Occupational Stress Questionnaire    Feeling of Stress : To some extent  Social Connections: Socially Isolated (01/30/2023)   Received from [facility]   Social Network    How would you rate your social network (family, work, friends)?: Little participation, lonely and socially isolated  Intimate Partner Violence: Not At Risk (05/31/2023)   Received from [facility]   HITS    Over the last 12 months how often did your partner physically hurt you?: Never    Over the last 12 months how often did your partner insult you or talk down to you?: Never    Over the last 12 months how often did your partner threaten you with physical harm?: Never    Over the last 12 months how often did your partner scream or curse at you?: Never    PHYSICAL EXAM  GENERAL  EXAM/CONSTITUTIONAL: Vitals:  Vitals:   06/06/23 1402  BP: 114/60  Weight: 241 lb (109.3 kg)  Height: 5\' 4"  (1.626 m)   Body mass index is 41.37 kg/m. Wt Readings from Last 3 Encounters:  06/06/23 241 lb (109.3 kg)  03/30/23 235 lb (106.6 kg)  07/01/22 245 lb 6 oz (111.3 kg)   Patient is in no distress; well developed, nourished and groomed; neck is supple  MUSCULOSKELETAL: Gait, strength, tone, movements noted in Neurologic exam below  NEUROLOGIC: MENTAL STATUS:      No data to display         awake, alert, oriented to person, place and time recent and remote memory intact normal attention and concentration language fluent, comprehension intact, naming intact fund of knowledge appropriate  CRANIAL NERVE:  2nd, 3rd, 4th, 6th - Visual fields full to confrontation, extraocular muscles intact, no nystagmus 5th - facial sensation symmetric 7th - facial strength symmetric 8th - hearing intact 9th - palate elevates symmetrically, uvula midline 11th - shoulder shrug symmetric 12th - tongue protrusion midline  MOTOR:  normal bulk and tone, full strength in the BUE, at least antigravity BLE  SENSORY:  Decrease sensation to light touch and vibration up to mid shin  COORDINATION:  finger-nose-finger, fine finger movements normal  GAIT/STATION:  Deferred, using a wheelchair     DIAGNOSTIC DATA (LABS, IMAGING, TESTING) - I reviewed patient records, labs, notes, testing and imaging myself where available.  Lab Results  Component Value Date   WBC 7.4 05/30/2023   HGB 12.7 05/30/2023   HCT 38.1 05/30/2023   MCV 91.6 05/30/2023   PLT 205 05/30/2023      Component Value Date/Time   NA 139 05/30/2023 1428   K 4.8 05/30/2023 1428   CL 104 05/30/2023 1428   CO2 27 05/30/2023 1428   GLUCOSE 142 (H) 05/30/2023 1428   BUN 17 05/30/2023 1428   CREATININE 1.08 (H) 05/30/2023 1428   CALCIUM 9.5 05/30/2023 1428   PROT 6.6 05/30/2023 1428   ALBUMIN 3.7 05/30/2023  1428   AST 13 (L) 05/30/2023 1428   ALT 8 05/30/2023 1428   ALKPHOS 125 05/30/2023 1428   BILITOT 0.4 05/30/2023 1428   GFRNONAA 59 (L)  05/30/2023 1428   GFRAA 58 (L) 08/08/2017 1937   Lab Results  Component Value Date   CHOL 184 07/12/2017   HDL 36 (L) 07/12/2017   LDLCALC UNABLE TO CALCULATE IF TRIGLYCERIDE OVER 400 mg/dL 52/84/1324   TRIG 401 (H) 01/15/2022   CHOLHDL 5.1 07/12/2017   Lab Results  Component Value Date   HGBA1C QNSTST 06/29/2022   No results found for: "VITAMINB12" Lab Results  Component Value Date   TSH 0.985 07/11/2017    CT head/CTA Head and Neck 05/30/2023 1. No acute intracranial abnormality. 2. No large vessel occlusion, hemodynamically significant stenosis, or aneurysm in the head or neck. 3. Moderate mixed plaque along the left carotid bulb results in less than 50% stenosis of the left ICA origin.    ASSESSMENT AND PLAN  62 y.o. year old female with   Assessment and Plan    Speech and Motor Disturbances   Intermittent episodes of slurred speech, word-finding difficulties, and jerky movements in her, often triggered by stress, lack of sleep, and inadequate nutrition, suggest stress-related symptoms, neurological disorders, or potential metabolic causes. Despite previous imaging and vessel studies showing no signs of stroke, we will order an MRI of the brain to exclude neurological causes and provide surgical clearance if the MRI is negative. We will increase Lexapro to 20 mg and consider home physical therapy to address these symptoms in the future  Major Depression   She has a long-standing history of major depression, currently managed with Lexapro. Given the exacerbation of symptoms due to stress and personal circumstances, we will increase Lexapro to 20 mg and monitor for symptom improvement, acknowledging the need for a higher dose in light of increased stress levels. She is also on Duloxetine, advised to contact me if she is having agitation,  restlessness concerning for Serotonin syndrome.  Diabetic Neuropathy   Severe neuropathy in her legs, secondary to long-standing diabetes, presents with lack of sensation and multiple falls, currently managed with gabapentin and Cymbalta. We discussed the potential for a spinal cord stimulator and noted that high doses of gabapentin might contribute to weight gain and worsening kidney function, which could hinder weight loss efforts. We will continue gabapentin 600 mg four times a day, continue Cymbalta, and discuss the potential for a spinal cord stimulator further.  General Health Maintenance   For diabetes management and weight loss efforts, we encourage her to continue her weight loss efforts and to monitor blood glucose levels regularly, emphasizing the importance of these measures in her overall health maintenance.  Follow-up   We will schedule an MRI of the brain and after reviewing the MRI results, we will complete the surgical clearance form if the MRI is negative, ensuring a comprehensive approach to her care.        1. Slurred speech      Patient Instructions  Increase Lexapro to 20 mg daily, please monitor of agitation, palpitation, restlessness MRI Brain without contrast to rule out intracranial abnormalities  Continue to follow up withy our doctors  Return as needed   Orders Placed This Encounter  Procedures   MR BRAIN WO CONTRAST    Meds ordered this encounter  Medications   escitalopram (LEXAPRO) 20 MG tablet    Sig: Take 1 tablet (20 mg total) by mouth daily.    Dispense:  30 tablet    Refill:  11    Return if symptoms worsen or fail to improve.    Windell Norfolk, MD 06/07/2023, 11:08 AM  [facility] [address] [place], [place] [address] [phone]

## 2023-06-07 ENCOUNTER — Encounter: Payer: Self-pay | Admitting: Neurology

## 2023-06-07 NOTE — Patient Instructions (Signed)
Increase Lexapro to 20 mg daily, please monitor of agitation, palpitation, restlessness MRI Brain without contrast to rule out intracranial abnormalities  Continue to follow up withy our doctors  Return as needed

## 2023-06-11 ENCOUNTER — Telehealth: Payer: Self-pay

## 2023-06-11 NOTE — Telephone Encounter (Signed)
Faxed surgical clearance to dr. Jamelle Haring daws at [place]

## 2023-06-15 ENCOUNTER — Ambulatory Visit (HOSPITAL_COMMUNITY)
Admission: RE | Admit: 2023-06-15 | Discharge: 2023-06-15 | Disposition: A | Discharge status: Home / Self Care | Payer: 59 | Source: Ambulatory Visit | Attending: Neurology

## 2023-06-15 DIAGNOSIS — R4781 Slurred speech: Secondary | ICD-10-CM | POA: Insufficient documentation

## 2023-06-22 ENCOUNTER — Other Ambulatory Visit (HOSPITAL_COMMUNITY): Payer: Self-pay

## 2023-06-22 ENCOUNTER — Other Ambulatory Visit: Payer: Self-pay

## 2023-06-26 ENCOUNTER — Other Ambulatory Visit: Payer: Self-pay

## 2023-07-02 NOTE — Telephone Encounter (Signed)
[name] from Dr. Jamelle Haring office Checking status of surgery clearance. Requesting callback at [phone].

## 2023-07-02 NOTE — Telephone Encounter (Signed)
Returned call to dr. Jamelle Haring office and they received it after all

## 2023-07-11 NOTE — Telephone Encounter (Signed)
Call to daughter, explained per our notes, Dr. Jamelle Haring has received clearance letter and they will reach out if more information is needed. No other questions this time

## 2023-07-11 NOTE — Telephone Encounter (Signed)
Pt's daughter called stating that the surgical clearance has not been received and would like to know if it can be refaxed.

## 2023-07-18 ENCOUNTER — Other Ambulatory Visit (HOSPITAL_COMMUNITY): Payer: Self-pay

## 2023-07-23 NOTE — Telephone Encounter (Signed)
[name] with Dr. Jamelle Haring Daws office calling to check status of surgery clearance. States pt is due to have surgery 08/03/2023. Requesting call back [phone]

## 2023-07-23 NOTE — Telephone Encounter (Signed)
Pt's daughter was called and informed. She verbalized appreciation.

## 2023-07-23 NOTE — Telephone Encounter (Signed)
Phone room: Please let then know clearance was faxed and we received confirmation it was received by them  Thanks,  Prerna Harold

## 2023-07-26 NOTE — Telephone Encounter (Signed)
 Pt called saying that she was informed that the clearance forms have not been received. She would like for it to be faxed again to [facility] [place] [phone]

## 2023-07-27 NOTE — Telephone Encounter (Signed)
 Dr. Donell Beers Lurena Joiner) Calling to check on the status of the clearance form. Have called twice to 1/27 and 12/30. Surgery on the 08/03/23, if we do not have that form we can not do her surgery. Please fax to [phone]

## 2023-07-30 NOTE — Telephone Encounter (Signed)
  Faxed surgical clearnace to [phone]

## 2023-08-15 ENCOUNTER — Other Ambulatory Visit: Payer: Self-pay

## 2023-09-04 ENCOUNTER — Ambulatory Visit: Payer: 59 | Admitting: Neurology

## 2023-09-12 ENCOUNTER — Other Ambulatory Visit (HOSPITAL_COMMUNITY): Payer: Self-pay

## 2024-01-03 ENCOUNTER — Ambulatory Visit: Admitting: Podiatry

## 2024-02-26 ENCOUNTER — Encounter: Payer: Self-pay | Admitting: Internal Medicine

## 2024-03-19 ENCOUNTER — Ambulatory Visit: Admitting: Internal Medicine

## 2024-06-05 ENCOUNTER — Ambulatory Visit: Payer: 59 | Admitting: Neurology

## 2024-06-28 ENCOUNTER — Emergency Department (HOSPITAL_BASED_OUTPATIENT_CLINIC_OR_DEPARTMENT_OTHER)
Admission: EM | Admit: 2024-06-28 | Discharge: 2024-06-28 | Disposition: A | Discharge status: Home / Self Care | Attending: Emergency Medicine | Admitting: Emergency Medicine

## 2024-06-28 ENCOUNTER — Encounter (HOSPITAL_BASED_OUTPATIENT_CLINIC_OR_DEPARTMENT_OTHER): Payer: Self-pay

## 2024-06-28 ENCOUNTER — Emergency Department (HOSPITAL_BASED_OUTPATIENT_CLINIC_OR_DEPARTMENT_OTHER)

## 2024-06-28 ENCOUNTER — Other Ambulatory Visit: Payer: Self-pay

## 2024-06-28 DIAGNOSIS — L989 Disorder of the skin and subcutaneous tissue, unspecified: Secondary | ICD-10-CM | POA: Insufficient documentation

## 2024-06-28 MED ORDER — DOXYCYCLINE HYCLATE 100 MG PO CAPS: 100.0000 mg | ORAL_CAPSULE | Freq: Two times a day (BID) | ORAL | 0 refills | Status: AC

## 2024-06-28 MED ORDER — FLUCONAZOLE 200 MG PO TABS: 200.0000 mg | ORAL_TABLET | Freq: Every day | ORAL | 0 refills | Status: AC

## 2024-06-28 NOTE — ED Provider Notes (Signed)
 [place] EMERGENCY DEPARTMENT AT [facility] Provider Note   CSN: [id number] Arrival date & time: 06/28/24  1643     Patient presents with: Abscess and Head Pain   [name] is a 63 y.o. female here for evaluation of bump to her scalp.  First noticed it about 3 days ago.  She did have a mechanical fall however she does not think she hit her head when she fell.  Daughter states the area looked like it wanted to drain however could not.  Area tender to palpation.  No fever, warmth.  No LOC, anticoagulation.   HPI     Prior to Admission medications   Medication Sig Start Date End Date Taking? Authorizing Provider  doxycycline  (VIBRAMYCIN ) 100 MG capsule Take 1 capsule (100 mg total) by mouth 2 (two) times daily. 06/28/24  Yes Cartier Washko A, PA-C  carbamazepine  (TEGRETOL ) 200 MG tablet Take 200 mg by mouth 2 (two) times daily. 02/19/22   [provider]  DULoxetine  (CYMBALTA ) 60 MG capsule Take 60 mg by mouth daily.    [provider]  escitalopram  (LEXAPRO ) 20 MG tablet Take 1 tablet (20 mg total) by mouth daily. 06/06/23 05/31/24  Camara, Amadou, MD  gabapentin  (NEURONTIN ) 600 MG tablet Take 0.5 tablets (300 mg total) by mouth 3 (three) times daily. Patient taking differently: Take 600 mg by mouth in the morning, at noon, in the evening, and at bedtime. 05/18/21   Antoinette Doe, MD  NOVOLOG  RELION 100 UNIT/ML injection Inject 4-20 Units into the skin 3 (three) times daily with meals. Per sliding scale:70-130; 0 units, 130 to 180; 4 units; 180-240; 8 units; 241-300 ; 10 units; 301-350; 12 units; 351-400; 16 units. > 400 20 units and call MD 10/31/21   [provider]  pioglitazone  (ACTOS ) 15 MG tablet Take 15 mg by mouth daily. 03/22/22   [provider]  tirzepatide  (MOUNJARO ) 15 MG/0.5ML Pen Inject 15 mg into the skin once a week. 02/02/23     TRESIBA 100 UNIT/ML SOLN Inject 18 mLs into the muscle at bedtime. 05/12/22   [provider]    Allergies: Ibuprofen, Red dye #40 (allura red), Codeine, and Nitrofurantoin    Review of Systems  Constitutional: Negative.   HENT: Negative.    Respiratory: Negative.    Cardiovascular: Negative.   Gastrointestinal: Negative.   Genitourinary: Negative.   Musculoskeletal: Negative.   Skin: Negative.   Neurological: Negative.   All other systems reviewed and are negative.   Updated Vital Signs BP (!) 164/69   Pulse 87   Temp 98.6 F (37 C) (Oral)   Resp 16   Ht 5' 4 (1.626 m)   Wt 110 kg   SpO2 99%   BMI 41.63 kg/m   Physical Exam Vitals and nursing note reviewed.  Constitutional:      General: She is not in acute distress.    Appearance: She is well-developed. She is not ill-appearing, toxic-appearing or diaphoretic.  HENT:     Head: Normocephalic.     Comments: 1.5 cm firm, mass posterior right occiput.  No fluctuance.  Mild surrounding erythema with punctate scab at center. Eyes:     Pupils: Pupils are equal, round, and reactive to light.  Cardiovascular:     Rate and Rhythm: Normal rate.     Pulses: Normal pulses.     Heart sounds: Normal heart sounds.  Pulmonary:     Effort: Pulmonary effort is normal. No respiratory distress.  Breath sounds: Normal breath sounds.  Abdominal:     General: Bowel sounds are normal. There is no distension.     Palpations: Abdomen is soft.  Musculoskeletal:        General: Normal range of motion.     Cervical back: Normal range of motion.  Skin:    General: Skin is warm and dry.  Neurological:     General: No focal deficit present.     Mental Status: She is alert.  Psychiatric:        Mood and Affect: Mood normal.     (all labs ordered are listed, but only abnormal results are displayed) Labs Reviewed - No data to display  EKG: None  Radiology: CT Head Wo Contrast Result Date: 06/28/2024 EXAM: CT HEAD WITHOUT CONTRAST 06/28/2024 05:37:04 PM TECHNIQUE: CT of the head was performed without the  administration of intravenous contrast. Automated exposure control, iterative reconstruction, and/or weight based adjustment of the mA/kV was utilized to reduce the radiation dose to as low as reasonably achievable. COMPARISON: None available. CLINICAL HISTORY: Palpable nodule in the scalp with increasing pain. Personal history of cryoablation of a renal tumor. Patient is concerned about tumor recurrence. FINDINGS: BRAIN AND VENTRICLES: No acute hemorrhage. No evidence of acute infarct. No hydrocephalus. No extra-axial collection. No mass effect or midline shift. Periventricular white matter hypoattenuation is mildly advanced for age. Atherosclerotic calcifications are present in the cavernous carotid arteries bilaterally and at the dural margin of both vertebral arteries. No hyperdense vessel is present. ORBITS: No acute abnormality. SINUSES: No acute abnormality. SOFT TISSUES AND SKULL: An ill-defined soft tissue density is present to the right of midline in the occipital scalp. Mild skin thickening is present as well. This is most likely inflammatory. No osseous destruction is present. No skull fracture. IMPRESSION: 1. Ill-defined soft tissue density in the right occipital scalp with mild skin thickening, likely inflammatory. No osseous destruction. 2. Mildly advanced periventricular white matter hypoattenuation for age. 3. Atherosclerotic calcifications in the cavernous carotid arteries bilaterally and at the dural margin of both vertebral arteries. No hyperdense vessel. Electronically signed by: Lonni Necessary MD 06/28/2024 05:41 PM EST RP Workstation: [id number]     .Ultrasound ED Soft Tissue  Date/Time: 06/28/2024 6:04 PM  Performed by: Edie Rosebud LABOR, PA-C Authorized by: Edie Rosebud LABOR, PA-C   Procedure details:    Indications: localization of abscess and evaluate for cellulitis     Transverse view:  Visualized   Longitudinal view:  Visualized   Images: archived     Limitations:   Body habitus Location:    Location: head     Side:  Right Findings:     no abscess present    Medications Ordered in the ED - No data to display  63 year old here for evaluation of mass to posterior scalp.  Had mechanical fall a few days ago her did not think she initially hit her head.  Area tender to palpation.  1.5 cm, firm mass with mild erythema with central punctate scab.  No fluctuance.  She has no systemic symptoms.  No LOC, anticoagulation.  Will plan on imaging, reassess  Imaging personally viewed interpreted CT head with scalp lesion suspect inflammatory  Bedside ultrasound performed, no drainable fluid collection  Follow-up patient to follow-up outpatient, doxycycline , anti-inflammatories, warm compress to the area.  If does not improve may need biopsy or drainage.  Does not seem deep or intracranial or having bony involvement.  No systemic symptoms.  The patient has been  appropriately medically screened and/or stabilized in the ED. I have low suspicion for any other emergent medical condition which would require further screening, evaluation or treatment in the ED or require inpatient management.  Patient is hemodynamically stable and in no acute distress.  Patient able to ambulate in department prior to ED.  Evaluation does not show acute pathology that would require ongoing or additional emergent interventions while in the emergency department or further inpatient treatment.  I have discussed the diagnosis with the patient and answered all questions.  Pain is been managed while in the emergency department and patient has no further complaints prior to discharge.  Patient is comfortable with plan discussed in room and is stable for discharge at this time.  I have discussed strict return precautions for returning to the emergency department.  Patient was encouraged to follow-up with PCP/specialist refer to at discharge.                                   Medical Decision  Making Amount and/or Complexity of Data Reviewed Independent Historian: friend External Data Reviewed: labs, radiology and notes. Radiology: ordered and independent interpretation performed. Decision-making details documented in ED Course.  Risk OTC drugs. Prescription drug management. Decision regarding hospitalization. Diagnosis or treatment significantly limited by social determinants of health.        Final diagnoses:  Scalp lesion    ED Discharge Orders          Ordered    doxycycline  (VIBRAMYCIN ) 100 MG capsule  2 times daily        06/28/24 1802               Sora Olivo A, PA-C 06/28/24 1805    Ruthe Cornet, DO 06/28/24 1823

## 2024-06-28 NOTE — Discharge Instructions (Addendum)
 Your workup today was reassuring.  As we discussed you could be developing a small abscess to your scalp however this is not large enough to drain here.  Would recommend warm compress, ibuprofen and Tylenol .  I am starting on antibiotics.  The antibiotics doxycycline  may upset your stomach.  Make sure to take after eating and drink a full glass of water. If your symptoms do not improve make sure to follow-up outpatient and may need biopsy or drainage if gets larger or improve

## 2024-06-28 NOTE — ED Triage Notes (Signed)
 Arrives POV with complaints of having an large bump to her scalp that has become increasingly painful over the past 2 days. No recent head injuries.

## 2024-08-28 ENCOUNTER — Other Ambulatory Visit (HOSPITAL_COMMUNITY): Payer: Self-pay
